# Patient Record
Sex: Male | Born: 1945 | Race: White | Hispanic: No | Marital: Married | Smoking: Never smoker
Health system: Southern US, Community
[De-identification: ages and names within clinical notes are randomized; demographics above are authoritative.]

## PROBLEM LIST (undated history)

## (undated) DIAGNOSIS — G629 Polyneuropathy, unspecified: Secondary | ICD-10-CM

## (undated) DIAGNOSIS — M109 Gout, unspecified: Secondary | ICD-10-CM

## (undated) DIAGNOSIS — J309 Allergic rhinitis, unspecified: Secondary | ICD-10-CM

## (undated) DIAGNOSIS — I10 Essential (primary) hypertension: Secondary | ICD-10-CM

## (undated) DIAGNOSIS — E669 Obesity, unspecified: Secondary | ICD-10-CM

## (undated) DIAGNOSIS — I82409 Acute embolism and thrombosis of unspecified deep veins of unspecified lower extremity: Secondary | ICD-10-CM

## (undated) DIAGNOSIS — K635 Polyp of colon: Secondary | ICD-10-CM

## (undated) DIAGNOSIS — N529 Male erectile dysfunction, unspecified: Secondary | ICD-10-CM

## (undated) DIAGNOSIS — K5790 Diverticulosis of intestine, part unspecified, without perforation or abscess without bleeding: Secondary | ICD-10-CM

## (undated) DIAGNOSIS — R011 Cardiac murmur, unspecified: Secondary | ICD-10-CM

## (undated) DIAGNOSIS — N189 Chronic kidney disease, unspecified: Secondary | ICD-10-CM

## (undated) DIAGNOSIS — G4733 Obstructive sleep apnea (adult) (pediatric): Secondary | ICD-10-CM

## (undated) DIAGNOSIS — K219 Gastro-esophageal reflux disease without esophagitis: Secondary | ICD-10-CM

## (undated) DIAGNOSIS — E7800 Pure hypercholesterolemia, unspecified: Secondary | ICD-10-CM

## (undated) DIAGNOSIS — H9190 Unspecified hearing loss, unspecified ear: Secondary | ICD-10-CM

## (undated) DIAGNOSIS — G43909 Migraine, unspecified, not intractable, without status migrainosus: Secondary | ICD-10-CM

## (undated) DIAGNOSIS — I359 Nonrheumatic aortic valve disorder, unspecified: Secondary | ICD-10-CM

## (undated) DIAGNOSIS — M21371 Foot drop, right foot: Secondary | ICD-10-CM

## (undated) DIAGNOSIS — G7000 Myasthenia gravis without (acute) exacerbation: Secondary | ICD-10-CM

## (undated) DIAGNOSIS — H532 Diplopia: Secondary | ICD-10-CM

## (undated) HISTORY — DX: Myasthenia gravis without (acute) exacerbation: G70.00

## (undated) HISTORY — DX: Male erectile dysfunction, unspecified: N52.9

## (undated) HISTORY — DX: Polyp of colon: K63.5

## (undated) HISTORY — DX: Cardiac murmur, unspecified: R01.1

## (undated) HISTORY — DX: Unspecified hearing loss, unspecified ear: H91.90

## (undated) HISTORY — PX: OTHER SURGICAL HISTORY: SHX169

## (undated) HISTORY — DX: Diverticulosis of intestine, part unspecified, without perforation or abscess without bleeding: K57.90

## (undated) HISTORY — DX: Nonrheumatic aortic valve disorder, unspecified: I35.9

## (undated) HISTORY — DX: Gout, unspecified: M10.9

## (undated) HISTORY — DX: Obesity, unspecified: E66.9

## (undated) HISTORY — DX: Obstructive sleep apnea (adult) (pediatric): G47.33

## (undated) HISTORY — DX: Allergic rhinitis, unspecified: J30.9

## (undated) HISTORY — DX: Diplopia: H53.2

## (undated) HISTORY — DX: Gastro-esophageal reflux disease without esophagitis: K21.9

## (undated) HISTORY — DX: Chronic kidney disease, unspecified: N18.9

## (undated) HISTORY — DX: Essential (primary) hypertension: I10

## (undated) HISTORY — DX: Pure hypercholesterolemia, unspecified: E78.00

## (undated) HISTORY — DX: Migraine, unspecified, not intractable, without status migrainosus: G43.909

---
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
## 1997-09-17 ENCOUNTER — Other Ambulatory Visit: Admission: RE | Admit: 1997-09-17 | Discharge: 1997-09-17 | Payer: Self-pay | Admitting: Family Medicine

## 1998-05-26 ENCOUNTER — Ambulatory Visit (HOSPITAL_COMMUNITY): Admission: RE | Admit: 1998-05-26 | Discharge: 1998-05-26 | Payer: Self-pay | Admitting: Gastroenterology

## 2000-11-21 ENCOUNTER — Ambulatory Visit (HOSPITAL_COMMUNITY): Admission: RE | Admit: 2000-11-21 | Discharge: 2000-11-21 | Payer: Self-pay | Admitting: Gastroenterology

## 2000-11-21 ENCOUNTER — Encounter (INDEPENDENT_AMBULATORY_CARE_PROVIDER_SITE_OTHER): Payer: Self-pay

## 2001-06-27 ENCOUNTER — Ambulatory Visit (HOSPITAL_COMMUNITY): Admission: RE | Admit: 2001-06-27 | Discharge: 2001-06-27 | Payer: Self-pay | Admitting: Gastroenterology

## 2001-06-27 ENCOUNTER — Encounter (INDEPENDENT_AMBULATORY_CARE_PROVIDER_SITE_OTHER): Payer: Self-pay | Admitting: Specialist

## 2002-09-16 ENCOUNTER — Encounter (INDEPENDENT_AMBULATORY_CARE_PROVIDER_SITE_OTHER): Payer: Self-pay | Admitting: Specialist

## 2002-09-16 ENCOUNTER — Ambulatory Visit (HOSPITAL_COMMUNITY): Admission: RE | Admit: 2002-09-16 | Discharge: 2002-09-16 | Payer: Self-pay | Admitting: Gastroenterology

## 2002-12-20 ENCOUNTER — Ambulatory Visit (HOSPITAL_COMMUNITY): Admission: RE | Admit: 2002-12-20 | Discharge: 2002-12-20 | Payer: Self-pay | Admitting: Gastroenterology

## 2002-12-20 ENCOUNTER — Encounter (INDEPENDENT_AMBULATORY_CARE_PROVIDER_SITE_OTHER): Payer: Self-pay | Admitting: Specialist

## 2003-06-12 ENCOUNTER — Ambulatory Visit (HOSPITAL_COMMUNITY): Admission: RE | Admit: 2003-06-12 | Discharge: 2003-06-12 | Payer: Self-pay | Admitting: Gastroenterology

## 2003-06-12 ENCOUNTER — Encounter (INDEPENDENT_AMBULATORY_CARE_PROVIDER_SITE_OTHER): Payer: Self-pay | Admitting: Specialist

## 2005-10-11 ENCOUNTER — Encounter: Payer: Self-pay | Admitting: Internal Medicine

## 2005-12-06 ENCOUNTER — Ambulatory Visit: Payer: Self-pay | Admitting: Internal Medicine

## 2006-04-06 ENCOUNTER — Ambulatory Visit: Payer: Self-pay | Admitting: Internal Medicine

## 2008-04-30 ENCOUNTER — Telehealth (INDEPENDENT_AMBULATORY_CARE_PROVIDER_SITE_OTHER): Payer: Self-pay | Admitting: *Deleted

## 2008-06-04 DIAGNOSIS — G4733 Obstructive sleep apnea (adult) (pediatric): Secondary | ICD-10-CM

## 2008-06-05 ENCOUNTER — Ambulatory Visit: Payer: Self-pay | Admitting: Internal Medicine

## 2008-06-21 DIAGNOSIS — I10 Essential (primary) hypertension: Secondary | ICD-10-CM

## 2008-06-21 DIAGNOSIS — E785 Hyperlipidemia, unspecified: Secondary | ICD-10-CM | POA: Insufficient documentation

## 2008-06-21 DIAGNOSIS — J309 Allergic rhinitis, unspecified: Secondary | ICD-10-CM | POA: Insufficient documentation

## 2010-09-10 NOTE — Procedures (Signed)
[facility]  Patient:    Edward [name], Edward [name] Visit Number: [id number] MRN: [id number]          Service Type: END Location: ENDO Attending Physician:  Louie Bun Dictated by:   Everardo All Madilyn Fireman, M.D. Proc. Date: 06/27/01 Admit Date:  06/27/2001   CC:         Duncan Dull, M.D.   Procedure Report  PROCEDURE:  Esophagogastroduodenoscopy with biopsy.  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  DESCRIPTION OF PROCEDURE:  The patient was placed in the left lateral decubitus position then placed on the pulse monitor with continuous low flow oxygen delivered by nasal cannula. He was sedated with 50 mg IV Demerol and 4 mg IV Versed. The Olympus video endoscope was advanced under direct vision into the oropharynx and esophagus. The esophagus was straight and of normal caliber with the squamocolumnar line somewhat broken up at approximately 35-37 cm. The lower esophageal sphincter appeared to be at about 38 cm defining a Barretts segment of approximately 2-4 cm. There was a 3 cm hiatal hernia distal to the lower esophageal sphincter. No ulcerations, erosions or any visible worrisome mucosal abnormalities were seen within the Barretts segment or hernia sac. Six biopsies were taken from the Barretts segment. The stomach was entered and a small amount of liquid secretions were suctioned from the fundus. Retroflexed view of the cardia confirmed a hiatal hernia but otherwise unremarkable. The fundus, body, antrum and pylorus all appeared normal. The duodenum was entered and both the bulb and second portion were well inspected and appeared to be within normal limits. The scope was then withdrawn and the patient returned to the recovery room in stable condition. The patient tolerated the procedure well and there were no immediate complications.  IMPRESSION:  Relatively short segment of Barretts  esophagus.  PLAN:  Will await biopsy results to determine interval for next endoscopy. Dictated by:   Everardo All Madilyn Fireman, M.D. Attending Physician:  Louie Bun DD:  06/27/01 TD:  06/28/01 Job: [id number]

## 2010-09-10 NOTE — Op Note (Signed)
   NAME:  Edward [name], Edward [name]                            ACCOUNT NO.:  [id number]   MEDICAL RECORD NO.:  [id number]                   PATIENT TYPE:  AMB   LOCATION:  ENDO                                 FACILITY[facility]  [facility]   PHYSICIAN:  Sanav C. Madilyn Fireman, M.D.                 DATE OF BIRTH:  Sep 17, 1945   DATE OF PROCEDURE:  09/16/2002  DATE OF DISCHARGE:                                 OPERATIVE REPORT   PROCEDURE:  Colonoscopy.   INDICATIONS FOR PROCEDURE:  Colon cancer screening in a 65 year old patient  with no prior screening.   DESCRIPTION OF PROCEDURE:  The patient was placed in the left lateral  decubitus position and placed on the pulse monitor, with continuous low-flow  oxygen delivered by nasal cannula.  He was sedated with 12.5 mcg IV fentanyl  and 1 mg IV Versed, in addition to the medication given for the previous  EGD.  The Olympus video colonoscope was inserted into the rectum and  advanced to the cecum, confirmed by transillumination of McBurney's point  and visualization of the ileocecal valve and appendiceal orifice.  The prep  was good.  The cecum, ascending, transverse, descending  colon all appeared  normal -- with no masses, polyps, diverticula or other mucosal  abnormalities.  Within the sigmoid colon there were diffuse, scattered  diverticula and an 8 mm polyp at 32 cm.  This polyp was fulgurated by hot  biopsy.  The rectum appeared normal, and retroflexed view of the anus  revealed no obvious internal hemorrhoids.  The colonoscope as then withdrawn  and the patient returned to the recovery room in stable condition.  He  tolerated the procedure well and there were no immediate complications.   IMPRESSION:  1. Small sigmoid colon polyp.  2. Sigmoid diverticulosis.   PLAN:  Await biopsy results.                                               Arslan C. Madilyn Fireman, M.D.    JCH/MEDQ  D:  09/16/2002  T:  09/16/2002  Job:  [id number]   cc:   Duncan Dull, M.D.  [address]  [place]  [place] [address]  Fax: [phone]

## 2010-09-10 NOTE — Assessment & Plan Note (Signed)
[facility]                               PULMONARY OFFICE NOTE   NAME:Edward [name], Edward [name]                         MRN:          [id number]  DATE:12/06/2005                            DOB:          1946/03/14    PROBLEM:  Sleep medicine consultation at the kind request of Dr. Shaune Pollack  for this 65 year old gentleman with obstructive sleep apnea.   HISTORY:  His wife had been complaining for over one year with increasing  frequency about his loud snoring and even told him that he was noisy if he  sat breathing quietly watching T.V.  He was aware that he was more tired  during the daytime than he should be, needing naps and at least on one  occasion, falling asleep and hitting his head on his computer.  A nocturnal  polysomnogram was done at [facility] on October 11, 2005,  recording an AHI of 54 obstructive events per hour with an oxygen  desaturation to 73%, nonpositional snoring and successful C-PAP titration to  12 CWP for an AHI of 1.7 per hour.  He sees me to establish for long-term  management of his sleep apnea and interaction with his home care company.  He was fitting initially with an auto-titration machine through [employer] [facility].  That pressure titrated to 10.2 CWP as of July 22.  He reports  bedtime around 11 p.m. with a 10-15 minute sleep latency.  Previously he had  been waking at least three times a night for the bathroom.  Since wearing C-  PAP, he only needs to wake once before finally waking at 6:30 a.m.  He does  note that his weight has continued drifting up.   REVIEW OF SYSTEMS:  Loud snoring and witnessed apneas, daytime sleepiness,  dosing off in the early afternoon and waking more tired than he thinks he  should.  Some tenderness towards bronchitis at the beginning of each school  year in a fairly typical pattern.  He is not aware of unusual movement or  behavior in sleep other than as described.  He denies  headaches, confusion  or syncope.   PAST HISTORY:  Hypertension, elevated cholesterol, some mild seasonal pollen  rhinitis, usually controlled by Allegra, obstructive sleep apnea, prior  surgery for septoplasty.  He tried a laser-assisted palatoplasty, which  mainly resected the uvula and made little difference.  There is no history  of thyroid, lung or heart disease and no history of central nervous system  problems.   MEDICATIONS:  C-PAP had auto-titration, Allegra 180 mg, Lipitor, Lotrel,  Prevacid.   ALLERGIES:  No medication allergies.   SOCIAL HISTORY:  Never smoked, at least one-to-two cups of coffee a day as a  habit.  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   FAMILY HISTORY:  Parents with asthma and heart disease, a brother with  cancer.  Three brothers known to snore loudly, with whom at least one has  sleep apnea on C-PAP.   OBJECTIVE:  VITAL SIGNS:  Weight 244 pounds, blood pressure 118/66, pulse  regular 66, room air saturation 95%.  GENERAL:  This is a somewhat overweight, comfortable-appearing, alert  gentleman.  HEENT:  Voice quality is normal.  Nasal airway is clear.  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  No visible postnasal drainage.  There is fairly short  thick neck without stridor, thyromegaly, neck vein  distention.  CHEST:  Quiet, clear lung fields, unlabored breathing.  Heart sounds are  regular without murmur or gallop.  EXTREMITIES:  No restlessness or tremor.  No clubbing, cyanosis or edema.   IMPRESSION:  Obstructive sleep apnea/hypopnea with an AHI of 54 per hour on  sleep study done October 11, 2005 and subsequent C-PAP titration to a target  range between 10 and 12 CWP.  He is looking forward to getting fitted with a  fixed pressure machine.  We talked today about the physiology, medical  concerns and available treatment for obstructive sleep apnea, emphasizing  his responsibility to keep his  weight down and to drive safely.   PLAN:  [facility] is going to change him to a fixed pressure  machine at 11 CWP.  Will schedule return in four months unless needed  earlier and after that, anticipate seeing him once a year to maintain  contact, documentation and equipment as needed.   I appreciated the chance to meet Mr. Mercier and would be very happy to  discuss his care.                                   Clinton D. Maple Hudson, MD, [facility], FACP   CDY/MedQ  DD:  12/06/2005  DT:  12/07/2005  Job #:  [id number]   cc:   Duncan Dull, MD

## 2010-09-10 NOTE — Procedures (Signed)
[facility]  Patient:    Edward [name], Edward [name]                         MRN: [id number] Proc. Date: 11/21/00 Adm. Date:  [id number] Attending:  Louie Bun CC:         Duncan Dull, M.D.   Procedure Report  PROCEDURE PERFORMED:  Esophagogastroduodenoscopy.  ENDOSCOPIST:  Everardo All. Madilyn Fireman, M.D.  INDICATIONS FOR PROCEDURE:  History of Barretts esophagus based on EGD two years ago.  DESCRIPTION OF PROCEDURE:  The patient was placed in the left lateral decubitus position position and placed on the pulse monitor with continuous low-flow oxygen delivered by nasal cannula.  He was sedated with 50 mg IV Demerol and 6 mg IV Versed.  The Olympus video endoscope was advanced under direct vision to the oropharynx and esophagus.  The esophagus was straight and of normal caliber at the squamocolumnar line, eroded into several tongues of columnar epithelium extending from about 30 cm to 33 cm.  The true LES appeared to be at about 35 cm about a 3 cm hiatal hernia.  Six biopsies were taken from the Barretts segment.  There was no visible stricture, ring or visible suspicion of neoplasm.  The stomach was entered and a small amount of liquid secretions was suctioned from the fundus.  A retroflex view was unremarkable except for the hiatal hernia.  The fundus, body, antrum and pylorus all appeared normal.  The duodenum was entered and both the bulb and second portion were well inspected and appeared to be within normal limits. The scope was then withdrawn and the patient returned to the recovery room in stable condition.  He tolerated the procedure well.  There were no immediate complications.  IMPRESSION: 1. Barretts esophagus. 2. Hiatal hernia.  PLAN: Await histology to rule out dysplasia. DD:  11/21/00 TD:  11/21/00 Job: [id number]

## 2010-09-10 NOTE — Op Note (Signed)
   NAME:  Edward [name], Edward [name]                            ACCOUNT NO.:  [id number]   MEDICAL RECORD NO.:  [id number]                   PATIENT TYPE:  AMB   LOCATION:  ENDO                                 FACILITY[facility]  [facility]   PHYSICIAN:  Brendon C. Madilyn Fireman, M.D.                 DATE OF BIRTH:  1945/11/13   DATE OF PROCEDURE:  12/20/2002  DATE OF DISCHARGE:                                 OPERATIVE REPORT   PROCEDURE:  Colonoscopy.   INDICATIONS FOR PROCEDURE:  Barrett's esophagus with mild dysplasia on  biopsies from last EGD six months ago.   DESCRIPTION OF PROCEDURE:  The patient was placed in the left lateral  decubitus position then placed on the pulse monitor with continuous low flow  oxygen delivered by nasal cannula. He was sedated with 25 mcg IV fentanyl  and 4 mg IV Versed. The Olympus video endoscope was advanced under direct  vision into the oropharynx and esophagus. The esophagus was straight and of  normal caliber with the squamocolumnar line broken up at about 36 to 39 cm  with some islands of squamous mucosa below the main Z line. The true LES  appeared to be at about 39 cm and there was a 3 cm hiatal hernia distal to  it. Four biopsies were taken of the relatively short Barrett's segment and  there is no visible suspicion of neoplasm and no visible erosion no ring or  stricture. The stomach was entered and a small amount of liquid secretions  were suctioned from the fundus. Retroflexed view of the cardia confirmed a  hiatal hernia and was otherwise unremarkable. The fundus, body, antrum and  pylorus all appeared normal. The duodenum was entered and both the bulb and  second portion were well inspected and appeared to be within normal limits.  The scope was then withdrawn and the patient returned to the recovery room  in stable condition. He tolerated the procedure well and there were no  immediate complications.   IMPRESSION:  1. Approximately 3 cm segment of Barrett's esophagus.  2. 3 cm hiatal hernia.   PLAN:  Await biopsies to determine interval for future surveillance.                                               Joni C. Madilyn Fireman, M.D.    JCH/MEDQ  D:  12/20/2002  T:  12/20/2002  Job:  [id number]   cc:   Duncan Dull, M.D.  [address]  [place]  [place] [address]  Fax: [phone]

## 2010-09-10 NOTE — Assessment & Plan Note (Signed)
[facility]                             PULMONARY OFFICE NOTE   NAME:Edward [name]                         MRN:          [id number]  DATE:04/06/2006                            DOB:          1945-08-19    PROBLEM:  Obstructive sleep apnea.   HISTORY:  He is using CPAP every night.  He says it is annoying, but I  use it.  It clearly helps him breathe better.  He seems satisfied to  continue with this therapy.  His wife complains of his loud breathing if  he is sitting in the same room with her just watching TV, but not  asleep.  He does not feel tired during the daytime.  CPAP has been set  on 11 CWP through [facility], and we talked about comfort  measures.   MEDICATIONS:  1. CPAP 11 CWP.  2. Allergan 180 mg.  3. Lipitor.  4. Lotrel.  5. Prevacid.   ALLERGIES:  No medication allergy.   OBJECTIVE:  His weight last visit was recorded as 244 pounds, and on the  present visit as 200 pounds.  One of these is unlikely to be correct.  He is still overweight with a long palate.  There is a pink area on his  mid forehead above the level where his CPAP mask would interface, and he  does not think the mask is pressing uncomfortably.   IMPRESSION:  1. Obstructive sleep apnea, may be fairly well controlled, but we need      to maximize comfort from his continuous positive airway pressure      and would want to try a lower pressure step to see if we can get      away with it.  2. His snoring while watching TV may not respond to medications, but      we will see whether or not we can reduce some swelling and make a      change there for him.  I have discussed available measures.  We      will treat it as a rhinitis.  I have emphasized the importance of      weight loss.   PLAN:  1. [facility] is going to reduce his CPAP pressure from 11      to 10 CWP.  2. Try Nasacort AQ 1 or 2 sprays each nostril daily.  3. Schedule return in 4  months, earlier p.r.n.     Edward D. Maple Hudson, MD, Edward [name], FACP  Electronically Signed    CDY/MedQ  DD: 04/08/2006  DT: 04/08/2006  Job #: [id number]   cc:   Edward [name], M.D.

## 2010-09-10 NOTE — Op Note (Signed)
NAME:  Edward [name], Edward [name]                            ACCOUNT NO.:  [id number]   MEDICAL RECORD NO.:  [id number]                   PATIENT TYPE:  AMB   LOCATION:  ENDO                                 FACILITY:  [facility]   PHYSICIAN:  Boen C. Madilyn Fireman, M.D.                 DATE OF BIRTH:  10-19-1945   DATE OF PROCEDURE:  06/12/2003  DATE OF DISCHARGE:                                 OPERATIVE REPORT   PROCEDURE:  Esophagogastroduodenoscopy.   INDICATIONS FOR PROCEDURE:  Barrett's esophagus with low grade dysplasia on  last EGD one year ago.   DESCRIPTION OF PROCEDURE:  The patient was placed in the left lateral  decubitus position then placed on the pulse monitor with continuous low flow  oxygen delivered by nasal cannula. He was sedated with 50 mcg IV fentanyl  and 4 mg IV Versed. The Olympus video endoscope was advanced under direct  vision into the oropharynx and esophagus. The esophagus was straight and of  normal caliber with the squamocolumnar line at 34 cm above an approximately  3 cm segment of Barrett's esophagus.  There was an approximately 2 cm hiatal  hernia distal to the lower esophageal sphincter.  There was no visible  suspicion of malignancy and no discreet erosions or esophageal ulcers.  Biopsies were taken of the Barrett's segment.  The stomach was entered and a  small amount of liquid secretions were suctioned from the fundus.  Retroflexed view of the cardia confirmed a hiatal hernia and was otherwise  unremarkable. The fundus, body, antrum and pylorus all appeared normal. The  duodenum was entered and both the bulb and second portion were well  inspected and appeared to be within normal limits. The scope was then  withdrawn and the patient returned to the recovery room in stable condition.  He tolerated the procedure well and there were no immediate complications.   IMPRESSION:  Barrett's esophagus with hiatal hernia.   PLAN:  Await biopsy results to assess for  dysplasia.                                               Tacuma C. Madilyn Fireman, M.D.    JCH/MEDQ  D:  06/12/2003  T:  06/12/2003  Job:  [id number]   cc:   Duncan Dull, M.D.  [address]  [place]  [place] [address]  Fax: [phone]

## 2010-09-10 NOTE — Op Note (Signed)
   NAME:  MARON, STANZIONE                            ACCOUNT NO.:  [id number]   MEDICAL RECORD NO.:  [id number]                   PATIENT TYPE:  AMB   LOCATION:  ENDO                                 FACILITY:  [facility]   PHYSICIAN:  Dyshaun C. Madilyn Fireman, M.D.                 DATE OF BIRTH:  1946-01-05   DATE OF PROCEDURE:  09/16/2002  DATE OF DISCHARGE:                                 OPERATIVE REPORT   PREOPERATIVE DIAGNOSIS:  Esophagogastroduodenoscopy.   INDICATION FOR PROCEDURE:  Barrett's esophagus with last EGD approximately  one year ago.   DESCRIPTION OF PROCEDURE:  The patient was placed in the left lateral  decubitus position and placed on the pulse monitor with continuous low-flow  oxygen delivered by nasal cannula.  He was sedated with 62.5 mcg IV fentanyl  and 6 mg IV Versed.  The Olympus video endoscope was advanced under direct  vision into the oropharynx and esophagus.  The esophagus was straight and of  normal caliber with the squamocolumnar line very irregular with islands of  squamous mucosa below the main Z-line and islands of columnar epithelium  above it.  The main level of the Z-line was approximately 33 cm with the LES  thought to be at about 36 m above a 4 cm hiatal hernia, indicating a roughly  3 cm segment of Barrett's esophagus.  There was no stricture, ring, or  visible evidence of neoplasm and no esophageal ulcer.  The stomach was  entered and a small amount of liquid secretions were suctioned from the  fundus.  A retroflexed view of the cardia confirmed the hiatal hernia and  was otherwise unremarkable.  The fundus, body, antrum, and pylorus all  appeared normal.  The duodenum was entered and both the bulb and second  portion were well-inspected and appeared to be within normal limits.  The  scope was withdrawn back into the esophagus and biopsies of the Barrett's  segment were taken.  The scope was then withdrawn and the patient returned  to the recovery room in  stable condition.  He tolerated the procedure well,  and there were no immediate complications.   IMPRESSION:  1. Approximately 3 cm segment of Barrett's esophagus.  2. Hiatal hernia.   PLAN:  Await biopsy results.                                               Korie C. Madilyn Fireman, M.D.    JCH/MEDQ  D:  09/16/2002  T:  09/16/2002  Job:  [id number]   cc:   Duncan Dull, M.D.  [address]  [place]  [place] [address]  Fax: [phone]

## 2012-01-06 ENCOUNTER — Other Ambulatory Visit: Payer: Self-pay | Admitting: Gastroenterology

## 2013-02-07 ENCOUNTER — Encounter: Payer: Self-pay | Admitting: *Deleted

## 2013-02-07 ENCOUNTER — Encounter: Payer: Self-pay | Admitting: Cardiology

## 2013-02-07 DIAGNOSIS — M109 Gout, unspecified: Secondary | ICD-10-CM | POA: Insufficient documentation

## 2013-02-07 DIAGNOSIS — I10 Essential (primary) hypertension: Secondary | ICD-10-CM | POA: Insufficient documentation

## 2013-02-07 DIAGNOSIS — E669 Obesity, unspecified: Secondary | ICD-10-CM | POA: Insufficient documentation

## 2013-02-10 ENCOUNTER — Encounter: Payer: Self-pay | Admitting: Cardiology

## 2013-02-10 DIAGNOSIS — N200 Calculus of kidney: Secondary | ICD-10-CM | POA: Insufficient documentation

## 2013-02-10 DIAGNOSIS — K5790 Diverticulosis of intestine, part unspecified, without perforation or abscess without bleeding: Secondary | ICD-10-CM | POA: Insufficient documentation

## 2013-02-10 DIAGNOSIS — K219 Gastro-esophageal reflux disease without esophagitis: Secondary | ICD-10-CM | POA: Insufficient documentation

## 2013-02-10 DIAGNOSIS — K635 Polyp of colon: Secondary | ICD-10-CM | POA: Insufficient documentation

## 2013-02-10 DIAGNOSIS — G43909 Migraine, unspecified, not intractable, without status migrainosus: Secondary | ICD-10-CM | POA: Insufficient documentation

## 2013-02-11 ENCOUNTER — Ambulatory Visit (INDEPENDENT_AMBULATORY_CARE_PROVIDER_SITE_OTHER): Payer: Medicare Other | Admitting: Cardiology

## 2013-02-11 ENCOUNTER — Encounter: Payer: Self-pay | Admitting: Cardiology

## 2013-02-11 VITALS — BP 120/70 | HR 73 | Ht 69.0 in | Wt 235.0 lb

## 2013-02-11 DIAGNOSIS — I10 Essential (primary) hypertension: Secondary | ICD-10-CM

## 2013-02-11 DIAGNOSIS — E669 Obesity, unspecified: Secondary | ICD-10-CM

## 2013-02-11 DIAGNOSIS — G4733 Obstructive sleep apnea (adult) (pediatric): Secondary | ICD-10-CM

## 2013-02-11 NOTE — Patient Instructions (Signed)
Your physician wants you to follow-up in: 6 months with Dr. Turner. You will receive a reminder letter in the mail two months in advance. If you don't receive a letter, please call our office to schedule the follow-up appointment.  

## 2013-02-11 NOTE — Progress Notes (Signed)
[address] [place], [place]  [address] Phone: [phone] Fax:  [phone]  Date:  02/11/2013   ID:  Edward [name], DOB 19-Apr-1946, MRN [id number]  PCP:  No primary provider on file.  Cardiologist:  Armanda Magic, MD   History of Present Illness: Edward [name] is a 67 y.o. male with a history of OSA/HTN/OSA.  He is doing well.  He tolerates his CPAP well.  He tolerates the mask and pressure well.  He has no daytime sleepiness and feels refreshed when he gets up in the am.  His download today showed an AHI of 1.7/hr and 100% usage more than 4 hours nightly on 10cm H2O.  He gets anywhere from 6-8 hours of sleep nightly.  On the nights he does not get enough sleep he feels sleepy the next day.   Wt Readings from Last 3 Encounters:  06/05/08 242 lb (109.77 kg)     Past Medical History  Diagnosis Date  . Erectile dysfunction   . Heart murmur   . Sleep apnea     AHI 43/hr now on CPAP at 10cm H2O  . Hypercholesteremia   . HTN (hypertension)   . Hyperlipidemia   . Gout   . OSA (obstructive sleep apnea)   . Obesity   . Hearing loss     hearing aides  . Migraine headache   . Allergic rhinitis   . Diverticulosis   . GERD (gastroesophageal reflux disease)     with Barrett's esophagus  . Colon polyps   . Chronic kidney disease     kidney stones    Current Outpatient Prescriptions  Medication Sig Dispense Refill  . cetirizine (ZYRTEC) 10 MG tablet Take 10 mg by mouth daily.      . clobetasol cream (TEMOVATE) 0.05 % Apply topically as needed.      . fish oil-omega-3 fatty acids 1000 MG capsule Take 2 g by mouth 2 (two) times daily.      . indomethacin (INDOCIN) 50 MG capsule Take 50 mg by mouth as needed.      . vitamin C (ASCORBIC ACID) 500 MG tablet Take 500 mg by mouth daily.      Marland Kitchen allopurinol (ZYLOPRIM) 300 MG tablet Take 300 mg by mouth daily.      Marland Kitchen amLODipine-benazepril (LOTREL) 5-10 MG per capsule Take 1 capsule by mouth daily.      Marland Kitchen aspirin 81 MG tablet  Take 81 mg by mouth daily.      Marland Kitchen atorvastatin (LIPITOR) 10 MG tablet Take 10 mg by mouth daily.      . Biotin 1000 MCG tablet Take 1,000 mcg by mouth 3 (three) times daily.      . cholecalciferol (VITAMIN D) 1000 UNITS tablet Take 1,000 Units by mouth daily.      . Garlic 2000 MG CAPS Take by mouth.      . Multiple Vitamin (MULTIVITAMIN) capsule Take 1 capsule by mouth daily.      Marland Kitchen omeprazole (PRILOSEC) 20 MG capsule Take 20 mg by mouth daily.       No current facility-administered medications for this visit.    Allergies:   Allergies not on file  Social History:  The patient  reports that he has never smoked. He does not have any smokeless tobacco history on file. He reports that he drinks alcohol.   Family History:  The patient's family history includes Heart attack in his father; Heart disease in his brother and  father; Hypertension in his brother, brother, mother, and sister.   ROS:  Please see the history of present illness.      All other systems reviewed and negative.   PHYSICAL EXAM: VS:  There were no vitals taken for this visit. Well nourished, well developed, in no acute distress HEENT: normal Neck: no JVD Cardiac:  normal S1, S2; RRR; no murmur Lungs:  clear to auscultation bilaterally, no wheezing, rhonchi or rales Abd: soft, nontender, no hepatomegaly Ext: no edema Skin: warm and dry Neuro:  CNs 2-12 intact, no focal abnormalities noted  ASSESSMENT AND PLAN:  1. OSA on CPAP  - Continue CPAP at 10cm H2O 2. Obesity  - encouraged him to continue his exercise 3. HTN  - continue amlodipine  Followup with me in 6 months  Signed, Armanda Magic, MD 02/11/2013 10:04 AM

## 2013-02-15 ENCOUNTER — Ambulatory Visit: Payer: Self-pay | Admitting: Podiatrist

## 2013-03-29 ENCOUNTER — Other Ambulatory Visit: Payer: Self-pay | Admitting: Dermatology

## 2013-09-01 ENCOUNTER — Encounter: Payer: Self-pay | Admitting: Cardiology

## 2013-09-02 ENCOUNTER — Ambulatory Visit (INDEPENDENT_AMBULATORY_CARE_PROVIDER_SITE_OTHER): Payer: Medicare Other | Admitting: Cardiology

## 2013-09-02 ENCOUNTER — Encounter: Payer: Self-pay | Admitting: Cardiology

## 2013-09-02 VITALS — BP 122/80 | HR 80 | Ht 69.0 in | Wt 230.0 lb

## 2013-09-02 DIAGNOSIS — G4733 Obstructive sleep apnea (adult) (pediatric): Secondary | ICD-10-CM

## 2013-09-02 DIAGNOSIS — E669 Obesity, unspecified: Secondary | ICD-10-CM

## 2013-09-02 DIAGNOSIS — I10 Essential (primary) hypertension: Secondary | ICD-10-CM

## 2013-09-02 NOTE — Progress Notes (Signed)
[address] [place], [place]  [address] Phone: [phone] Fax:  [phone]  Date:  09/02/2013   ID:  Edward [name], DOB 08/19/1945, MRN [id number]  PCP:  Hollice EspyGATES,DONNA RUTH, MD  Cardiologist:  Armanda Magicraci Azyah Flett, MD     History of Present Illness: Edward [name] is a 68 y.o. male with a history of OSA/HTN/OSA. He is doing well. He tolerates his CPAP well. He tolerates the nasal pillow mask and pressure well. He has minimal daytime sleepiness and feels refreshed when he gets up in the am. His download today showed an AHI of 2.7/hr and 100% usage more than 4 hours nightly on 10cm H2O. He gets anywhere from 6-8 hours of sleep nightly. On the nights he does not get enough sleep he feels sleepy the next day.    Wt Readings from Last 3 Encounters:  09/02/13 230 lb (104.327 kg)  02/11/13 235 lb (106.595 kg)  06/05/08 242 lb (109.77 kg)     Past Medical History  Diagnosis Date  . Erectile dysfunction   . Heart murmur   . Sleep apnea     AHI 43/hr now on CPAP at 10cm H2O  . Hypercholesteremia   . HTN (hypertension)   . Hyperlipidemia   . Gout   . OSA (obstructive sleep apnea)   . Obesity   . Hearing loss     hearing aides  . Migraine headache   . Allergic rhinitis   . Diverticulosis   . GERD (gastroesophageal reflux disease)     with Barrett's esophagus  . Colon polyps   . Chronic kidney disease     kidney stones    Current Outpatient Prescriptions  Medication Sig Dispense Refill  . allopurinol (ZYLOPRIM) 300 MG tablet Take 300 mg by mouth daily.      Marland Kitchen. amLODipine-benazepril (LOTREL) 5-10 MG per capsule Take 1 capsule by mouth daily.      Marland Kitchen. aspirin 81 MG tablet Take 81 mg by mouth daily.      Marland Kitchen. atorvastatin (LIPITOR) 10 MG tablet Take 10 mg by mouth daily.      . Biotin 1000 MCG tablet Take 1,000 mcg by mouth 3 (three) times daily.      . cetirizine (ZYRTEC) 10 MG tablet Take 10 mg by mouth daily.      . cholecalciferol (VITAMIN D) 1000 UNITS tablet Take 1,000 Units  by mouth daily.      . clobetasol cream (TEMOVATE) 0.05 % Apply topically as needed.      . fish oil-omega-3 fatty acids 1000 MG capsule Take 2 g by mouth 2 (two) times daily.      . Garlic 2000 MG CAPS Take by mouth.      . indomethacin (INDOCIN) 50 MG capsule Take 50 mg by mouth as needed.      . Multiple Vitamin (MULTIVITAMIN) capsule Take 1 capsule by mouth daily.      Marland Kitchen. omeprazole (PRILOSEC) 20 MG capsule Take 20 mg by mouth daily.      Marland Kitchen. terbinafine (LAMISIL) 250 MG tablet Take 1 tablet by mouth daily.       No current facility-administered medications for this visit.    Allergies:   No Known Allergies  Social History:  The patient  reports that he has never smoked. He does not have any smokeless tobacco history on file. He reports that he drinks alcohol.   Family History:  The patient's family history includes Heart attack in his father; Heart  disease in his brother and father; Hypertension in his brother, brother, mother, and sister.   ROS:  Please see the history of present illness.      All other systems reviewed and negative.   PHYSICAL EXAM: VS:  BP 122/80  Pulse 80  Ht 5\' 9"  (1.753 m)  Wt 230 lb (104.327 kg)  BMI 33.95 kg/m2 Well nourished, well developed, in no acute distress HEENT: normal Neck: no JVD Cardiac:  normal S1, S2; RRR; no murmur Lungs:  clear to auscultation bilaterally, no wheezing, rhonchi or rales Abd: soft, nontender, no hepatomegaly Ext: trace edema Skin: warm and dry Neuro:  CNs 2-12 intact, no focal abnormalities noted   ASSESSMENT AND PLAN:  1. OSA on CPAP - well controlled and tolerating well at current CPAP setting - Continue CPAP at 10cm H2O  2. Obesity - encouraged him to continue his exercise but he is limited at present due to plantar fasciitis 3. HTN - well controlled - continue amlodipine   Followup with me in 6 months      Signed, Armanda Magicraci Kashayla Ungerer, MD 09/02/2013 9:47 AM

## 2013-09-02 NOTE — Patient Instructions (Signed)
Your physician recommends that you continue on your current medications as directed. Please refer to the Current Medication list given to you today.  Your physician wants you to follow-up in: 6 months with Dr Turner You will receive a reminder letter in the mail two months in advance. If you don't receive a letter, please call our office to schedule the follow-up appointment.  

## 2013-11-27 ENCOUNTER — Encounter: Payer: Self-pay | Admitting: Podiatry

## 2013-11-27 ENCOUNTER — Ambulatory Visit (INDEPENDENT_AMBULATORY_CARE_PROVIDER_SITE_OTHER): Payer: 59 | Admitting: Podiatry

## 2013-11-27 ENCOUNTER — Ambulatory Visit (INDEPENDENT_AMBULATORY_CARE_PROVIDER_SITE_OTHER): Payer: 59

## 2013-11-27 VITALS — BP 183/110 | HR 78 | Resp 17

## 2013-11-27 DIAGNOSIS — R52 Pain, unspecified: Secondary | ICD-10-CM

## 2013-11-27 DIAGNOSIS — M722 Plantar fascial fibromatosis: Secondary | ICD-10-CM

## 2013-11-27 MED ORDER — TRIAMCINOLONE ACETONIDE 10 MG/ML IJ SUSP
10.0000 mg | Freq: Once | INTRAMUSCULAR | Status: AC
  Administered 2013-11-27: 10 mg

## 2013-11-27 MED ORDER — DICLOFENAC SODIUM 75 MG PO TBEC: 75.0000 mg | DELAYED_RELEASE_TABLET | Freq: Two times a day (BID) | ORAL | Status: DC

## 2013-11-27 NOTE — Progress Notes (Signed)
Subjective:     Patient ID: Ellouise NewerJohn A Akhavan, male   DOB: 12/09/1945, 68 y.o.   MRN: [id number]  HPI patient presents stating both my heels are killing me. States it's been going on for about 18 months and that he did go to another Dr. who gave him an injection which was not successful and he has a brace orthotics and a boot   Review of Systems  All other systems reviewed and are negative.      Objective:   Physical Exam  Nursing note and vitals reviewed. Constitutional: He is oriented to person, place, and time.  Cardiovascular: Intact distal pulses.   Musculoskeletal: Normal range of motion.  Neurological: He is oriented to person, place, and time.  Skin: Skin is warm.   neurovascular status found to be intact with range of motion of the subtalar and midtarsal joint within normal and patient found to have normal muscle strength. Patient has exquisite inflammation with pain plantar fashion at the insertion of the tendon into the calcaneus with fluid buildup noted bilateral. Digits are well perfused and there is depression of the arch upon weightbearing     Assessment:     Acute plan her fasciitis of both feet which is been present for a fairly long 18 month.    Plan:     H&P and x-rays reviewed. Injected the plantar fascia bilateral 3 mg Kenalog 5 of vesica Marcaine mixture advised on night splint usage and discussed long-term change in orthotic therapy which we will discussed at next visit. Reappoint in 1 week

## 2013-11-27 NOTE — Patient Instructions (Signed)

## 2013-11-27 NOTE — Progress Notes (Signed)
   Subjective:    Patient ID: Ellouise NewerJohn A Twiggs, male    DOB: 12/12/1945, 68 y.o.   MRN: [id number]  HPI  Pt presents with h/o bilateral plantar fascial pain, ongoing since march 2014, has seen previous podiatrist who gave injections in bilateral heels and it did not help relieve the pain, states that orthotics arent helping with pain either  Review of Systems  All other systems reviewed and are negative.      Objective:   Physical Exam        Assessment & Plan:

## 2013-12-04 ENCOUNTER — Ambulatory Visit: Payer: 59 | Admitting: Podiatry

## 2013-12-05 ENCOUNTER — Encounter: Payer: Self-pay | Admitting: Podiatry

## 2013-12-05 ENCOUNTER — Ambulatory Visit (INDEPENDENT_AMBULATORY_CARE_PROVIDER_SITE_OTHER): Payer: 59 | Admitting: Podiatry

## 2013-12-05 VITALS — BP 132/74 | HR 78 | Resp 16

## 2013-12-05 DIAGNOSIS — M722 Plantar fascial fibromatosis: Secondary | ICD-10-CM

## 2013-12-05 MED ORDER — TRIAMCINOLONE ACETONIDE 10 MG/ML IJ SUSP
10.0000 mg | Freq: Once | INTRAMUSCULAR | Status: AC
  Administered 2013-12-05: 10 mg

## 2013-12-05 NOTE — Patient Instructions (Signed)

## 2013-12-06 NOTE — Progress Notes (Signed)
Subjective:     Patient ID: Edward [name], male   DOB: 11/11/1945, 68 y.o.   MRN: [id number]  HPI patient states that my heels have improved but they still bother me when I get up in the morning and this has been going on for an extensive period of time   Review of Systems     Objective:   Physical Exam Neurovascular status intact with muscle strength adequate and noted to have discomfort plantar heel region which is improved but still present bilateral heels    Assessment:     Plantar fasciitis of the heels which has improved but is still present    Plan:     Advised on physical therapy and the fact that this has been a long-term condition so there is no long-term guarantees. I did dispense a night splint for each heel and gave instructions on usage and also ice therapy and patient will be seen back in 4 weeks to reevaluate and decide what else may be necessary for this patient

## 2014-01-02 ENCOUNTER — Ambulatory Visit (INDEPENDENT_AMBULATORY_CARE_PROVIDER_SITE_OTHER): Payer: 59 | Admitting: Podiatry

## 2014-01-02 ENCOUNTER — Ambulatory Visit: Payer: 59 | Admitting: Podiatry

## 2014-01-02 ENCOUNTER — Encounter: Payer: Self-pay | Admitting: Podiatry

## 2014-01-02 VITALS — BP 134/76 | HR 69 | Resp 16

## 2014-01-02 DIAGNOSIS — M722 Plantar fascial fibromatosis: Secondary | ICD-10-CM

## 2014-01-02 NOTE — Progress Notes (Signed)
Subjective:     Patient ID: Edward [name], male   DOB: [birth year], 68 y.o.   MRN: [id number]  HPI patient states I'm doing really well with my heels with minimal discomfort and the splints seem to be helping me quite a bit   Review of Systems     Objective:   Physical Exam Neurovascular status intact with muscle strength adequate and range of motion of the subtalar and midtarsal joint within normal limits. 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

## 2014-03-03 ENCOUNTER — Encounter: Payer: Self-pay | Admitting: Cardiology

## 2014-03-03 ENCOUNTER — Ambulatory Visit (INDEPENDENT_AMBULATORY_CARE_PROVIDER_SITE_OTHER): Payer: Medicare Other | Admitting: Cardiology

## 2014-03-03 VITALS — BP 114/70 | HR 78 | Ht 69.0 in | Wt 238.0 lb

## 2014-03-03 DIAGNOSIS — E669 Obesity, unspecified: Secondary | ICD-10-CM

## 2014-03-03 DIAGNOSIS — G4733 Obstructive sleep apnea (adult) (pediatric): Secondary | ICD-10-CM

## 2014-03-03 DIAGNOSIS — I10 Essential (primary) hypertension: Secondary | ICD-10-CM

## 2014-03-03 NOTE — Progress Notes (Signed)
[address] [place], [place]  [address] Phone: [phone](336) [phone] Fax:  [phone]  Date:  03/03/2014   ID:  Edward NewerJohn A Knarr, DOB 12/01/1945, MRN [id number]  PCP:  Hollice EspyGATES,DONNA RUTH, MD  Cardiologist:  Armanda Magicraci Taylie Helder, MD    History of Present Illness: Edward [name] is a 68 y.o. male with a history of OSA/HTN/OSA. He is doing well. He tolerates his CPAP well. He tolerates the nasal pillow mask and pressure well. He has minimal daytime sleepiness and feels refreshed when he gets up in the am. He does occasionally take an afternoon nap.  His download today showed an AHI of 2.5/hr and 99% usage more than 4 hours nightly on 10cm H2O. He gets anywhere from 6-8 hours of sleep nightly. On the nights he does not get enough sleep he feels sleepy the next day   Wt Readings from Last 3 Encounters:  03/03/14 238 lb (107.956 kg)  09/02/13 230 lb (104.327 kg)  02/11/13 235 lb (106.595 kg)     Past Medical History  Diagnosis Date  . Erectile dysfunction   . Heart murmur   . Sleep apnea     AHI 43/hr now on CPAP at 10cm H2O  . Hypercholesteremia   . HTN (hypertension)   . Hyperlipidemia   . Gout   . OSA (obstructive sleep apnea)   . Obesity   . Hearing loss     hearing aides  . Migraine headache   . Allergic rhinitis   . Diverticulosis   . GERD (gastroesophageal reflux disease)     with Barrett's esophagus  . Colon polyps   . Chronic kidney disease     kidney stones    Current Outpatient Prescriptions  Medication Sig Dispense Refill  . allopurinol (ZYLOPRIM) 300 MG tablet Take 300 mg by mouth daily.    Marland Kitchen. amLODipine-benazepril (LOTREL) 5-10 MG per capsule Take 1 capsule by mouth daily.    Marland Kitchen. aspirin 81 MG tablet Take 81 mg by mouth daily.    Marland Kitchen. atorvastatin (LIPITOR) 10 MG tablet Take 10 mg by mouth daily.    . Biotin 1000 MCG tablet Take 1,000 mcg by mouth 3 (three) times daily.    . cetirizine (ZYRTEC) 10 MG tablet Take 10 mg by mouth daily.    . cholecalciferol (VITAMIN D) 1000  UNITS tablet Take 1,000 Units by mouth daily.    . diclofenac (VOLTAREN) 75 MG EC tablet Take 1 tablet (75 mg total) by mouth 2 (two) times daily. 50 tablet 2  . fish oil-omega-3 fatty acids 1000 MG capsule Take 1 g by mouth 2 (two) times daily.     . indomethacin (INDOCIN) 50 MG capsule Take 50 mg by mouth daily as needed (pain).     . Multiple Vitamin (MULTIVITAMIN) capsule Take 1 capsule by mouth daily.    Marland Kitchen. omeprazole (PRILOSEC) 20 MG capsule Take 20 mg by mouth daily.    Marland Kitchen. terbinafine (LAMISIL) 250 MG tablet Take 1 tablet by mouth daily.     No current facility-administered medications for this visit.    Allergies:   No Known Allergies  Social History:  The patient  reports that he has never smoked. He does not have any smokeless tobacco history on file. He reports that he drinks alcohol.   Family History:  The patient's family history includes Heart attack in his father; Heart disease in his brother and father; Hypertension in his brother, brother, mother, and sister.   ROS:  Please  see the history of present illness.      All other systems reviewed and negative.   PHYSICAL EXAM: VS:  BP 114/70 mmHg  Pulse 78  Ht 5\' 9"  (1.753 m)  Wt 238 lb (107.956 kg)  BMI 35.13 kg/m2 Well nourished, well developed, in no acute distress HEENT: normal Neck: no JVD Cardiac:  normal S1, S2; RRR; no murmur Lungs:  clear to auscultation bilaterally, no wheezing, rhonchi or rales Abd: soft, nontender, no hepatomegaly Ext: no edema Skin: warm and dry Neuro:  CNs 2-12 intact, no focal abnormalities noted  ASSESSMENT AND PLAN:  1. OSA on CPAP - well controlled and tolerating well at current CPAP setting - Continue CPAP at 10cm H2O  2. Obesity - has gained some weight - encouraged him to continue his exercise but he is limited due to plantar fasciitis.  I have recommended that he consider using a stationary bike or swim.   3. HTN - well controlled - continue amlodipine   Followup with me in  6 months        Signed, Armanda Magicraci Cortavius Montesinos, MD [facility] 03/03/2014 9:40 AM

## 2014-03-03 NOTE — Patient Instructions (Signed)
Your physician recommends that you continue on your current medications as directed. Please refer to the Current Medication list given to you today.  Your physician wants you to follow-up in: 6 months with Dr Turner You will receive a reminder letter in the mail two months in advance. If you don't receive a letter, please call our office to schedule the follow-up appointment.  

## 2014-03-05 ENCOUNTER — Encounter: Payer: Self-pay | Admitting: Cardiology

## 2014-06-09 ENCOUNTER — Encounter: Payer: Self-pay | Admitting: Cardiology

## 2014-09-06 NOTE — Progress Notes (Signed)
Cardiology Office Note   Date:  09/08/2014   ID:  Edward NewerJohn A Huntley, DOB 07/08/1945, MRN [id number]  PCP:  Hollice EspyGATES,DONNA RUTH, MD    Chief Complaint  Patient presents with  . Follow-up    sleep apnea  . Obesity  . Follow-up    hypertension      History of Present Illness: Edward [name] is a 69 y.o. male with a history of OSA/HTN/OSA. He is doing well. He tolerates his CPAP well. He tolerates the nasal pillow mask and pressure well. He has minimal daytime sleepiness and feels refreshed when he gets up in the am. He does occasionally take an afternoon nap. His download today showed an AHI of 2.5/hr and 100% usage more than 4 hours nightly on 10cm H2O. He gets anywhere from 6-8 hours of sleep nightly. He works in the yard for exercise.  He has tried getting more aerobic exercise which is limited by his plantar faciitis.  He occasionally has some dry mouth.  He does not have any nasal congestion with the device except in the spring with allergies   Past Medical History  Diagnosis Date  . Erectile dysfunction   . Heart murmur   . Sleep apnea     AHI 43/hr now on CPAP at 10cm H2O  . Hypercholesteremia   . HTN (hypertension)   . Hyperlipidemia   . Gout   . OSA (obstructive sleep apnea)   . Obesity   . Hearing loss     hearing aides  . Migraine headache   . Allergic rhinitis   . Diverticulosis   . GERD (gastroesophageal reflux disease)     with Barrett's esophagus  . Colon polyps   . Chronic kidney disease     kidney stones    Past Surgical History  Procedure Laterality Date  . Laser assisted uvuloplasty       Current Outpatient Prescriptions  Medication Sig Dispense Refill  . allopurinol (ZYLOPRIM) 300 MG tablet Take 300 mg by mouth daily.    Marland Kitchen. amLODipine-benazepril (LOTREL) 5-10 MG per capsule Take 1 capsule by mouth daily.    Marland Kitchen. aspirin 81 MG tablet Take 81 mg by mouth daily.    Marland Kitchen. atorvastatin (LIPITOR) 10 MG tablet Take 10 mg by mouth daily.    . Biotin 1000 MCG  tablet Take 1,000 mcg by mouth daily.     . cetirizine (ZYRTEC) 10 MG tablet Take 10 mg by mouth daily.    . cholecalciferol (VITAMIN D) 1000 UNITS tablet Take 1,000 Units by mouth daily.    . diclofenac (VOLTAREN) 75 MG EC tablet Take 1 tablet (75 mg total) by mouth 2 (two) times daily. 50 tablet 2  . fish oil-omega-3 fatty acids 1000 MG capsule Take 1 g by mouth 2 (two) times daily.     . indomethacin (INDOCIN) 50 MG capsule Take 50 mg by mouth daily as needed (pain).     . Multiple Vitamin (MULTIVITAMIN) capsule Take 1 capsule by mouth daily.    Marland Kitchen. omeprazole (PRILOSEC) 20 MG capsule Take 20 mg by mouth daily.     No current facility-administered medications for this visit.    Allergies:   Review of patient's allergies indicates no known allergies.    Social History:  The patient  reports that he has never smoked. He does not have any smokeless tobacco history on file. He reports that he drinks alcohol.   Family History:  The patient's family history includes Heart attack in  his father; Heart disease in his brother and father; Hypertension in his brother, brother, mother, and sister.    ROS:  Please see the history of present illness.   Otherwise, review of systems are positive for none.   All other systems are reviewed and negative.    PHYSICAL EXAM: VS:  BP 100/70 mmHg  Pulse 73  Ht 5\' 9"  (1.753 m)  Wt 237 lb 6.4 oz (107.684 kg)  BMI 35.04 kg/m2  SpO2 94% , BMI Body mass index is 35.04 kg/(m^2). GEN: Well nourished, well developed, in no acute distress HEENT: normal Neck: no JVD, carotid bruits, or masses Cardiac: RRR; no murmurs, rubs, or gallops,no edema  Respiratory:  clear to auscultation bilaterally, normal work of breathing GI: soft, nontender, nondistended, + BS MS: no deformity or atrophy Skin: warm and dry, no rash Neuro:  Strength and sensation are intact Psych: euthymic mood, full affect   EKG:  EKG is not ordered today.    Recent Labs: No results found  for requested labs within last 365 days.    Lipid Panel No results found for: CHOL, TRIG, HDL, CHOLHDL, VLDL, LDLCALC, LDLDIRECT    Wt Readings from Last 3 Encounters:  09/08/14 237 lb 6.4 oz (107.684 kg)  03/03/14 238 lb (107.956 kg)  09/02/13 230 lb (104.327 kg)    ASSESSMENT AND PLAN:  1. OSA on CPAP - well controlled and tolerating well at current CPAP setting - Continue CPAP at 10cm H2O  2. Obesity  3. - encouraged him to continue his exercise but he is limited due to plantar fasciitis.   4. HTN - well controlled - continue amlodipine     Current medicines are reviewed at length with the patient today.  The patient does not have concerns regarding medicines.  The following changes have been made:  no change  Labs/ tests ordered today include: see above assessment and plan No orders of the defined types were placed in this encounter.     Disposition:   FU with me in 1 year   Signed, Quintella ReichertURNER,TRACI R, MD  09/08/2014 9:37 AM    [facility] [address], [place], [place]  [address] Phone: [phone](336) [phone]; Fax: [phone]

## 2014-09-08 ENCOUNTER — Encounter: Payer: Self-pay | Admitting: Cardiology

## 2014-09-08 ENCOUNTER — Ambulatory Visit (INDEPENDENT_AMBULATORY_CARE_PROVIDER_SITE_OTHER): Payer: Medicare Other | Admitting: Cardiology

## 2014-09-08 VITALS — BP 100/70 | HR 73 | Ht 69.0 in | Wt 237.4 lb

## 2014-09-08 DIAGNOSIS — E669 Obesity, unspecified: Secondary | ICD-10-CM | POA: Diagnosis not present

## 2014-09-08 DIAGNOSIS — I10 Essential (primary) hypertension: Secondary | ICD-10-CM

## 2014-09-08 DIAGNOSIS — G4733 Obstructive sleep apnea (adult) (pediatric): Secondary | ICD-10-CM

## 2014-09-08 NOTE — Patient Instructions (Signed)

## 2014-09-30 ENCOUNTER — Encounter: Payer: Self-pay | Admitting: Cardiology

## 2015-08-26 ENCOUNTER — Encounter: Payer: Self-pay | Admitting: Cardiology

## 2015-08-26 ENCOUNTER — Ambulatory Visit (INDEPENDENT_AMBULATORY_CARE_PROVIDER_SITE_OTHER): Payer: Medicare Other | Admitting: Cardiology

## 2015-08-26 VITALS — BP 120/68 | HR 64 | Ht 68.0 in | Wt 215.0 lb

## 2015-08-26 DIAGNOSIS — R011 Cardiac murmur, unspecified: Secondary | ICD-10-CM

## 2015-08-26 DIAGNOSIS — G4733 Obstructive sleep apnea (adult) (pediatric): Secondary | ICD-10-CM

## 2015-08-26 DIAGNOSIS — E669 Obesity, unspecified: Secondary | ICD-10-CM | POA: Diagnosis not present

## 2015-08-26 DIAGNOSIS — I10 Essential (primary) hypertension: Secondary | ICD-10-CM

## 2015-08-26 NOTE — Progress Notes (Signed)
Cardiology Office Note    Date:  08/26/2015   ID:  Edward NewerJohn A Hiscox, DOB 05/14/1945, MRN [id number]  PCP:  Hollice EspyGATES,DONNA RUTH, MD  Cardiologist:  Quintella ReichertURNER,TRACI R, MD   Chief Complaint  Patient presents with  . Sleep Apnea  . Hypertension    History of Present Illness:  Edward [name] is a 70 y.o. male with a history of OSA/HTN and obesity. He is doing well. He tolerates his CPAP well. He tolerates the nasal pillow mask and pressure well. He has minimal daytime sleepiness and feels refreshed when he gets up in the am. He does occasionally take an afternoon nap. He works in the yard for exercise and walks a few miles daily.  He occasionally has some dry mouth and does not use a chin strap. He does not have any nasal congestion with the device except in the spring with allergies    Past Medical History  Diagnosis Date  . Erectile dysfunction   . Heart murmur   . Hypercholesteremia   . HTN (hypertension)   . Gout   . OSA (obstructive sleep apnea)     AHI 43/hr now on CPAP at 10cm H2O  . Obesity   . Hearing loss     hearing aides  . Migraine headache   . Allergic rhinitis   . Diverticulosis   . GERD (gastroesophageal reflux disease)     with Barrett's esophagus  . Colon polyps   . Chronic kidney disease     kidney stones    Past Surgical History  Procedure Laterality Date  . Laser assisted uvuloplasty      Current Medications: Outpatient Prescriptions Prior to Visit  Medication Sig Dispense Refill  . allopurinol (ZYLOPRIM) 300 MG tablet Take 300 mg by mouth daily.    Marland Kitchen. amLODipine-benazepril (LOTREL) 5-10 MG per capsule Take 1 capsule by mouth daily.    . Biotin 1000 MCG tablet Take 1,000 mcg by mouth daily.     . cetirizine (ZYRTEC) 10 MG tablet Take 10 mg by mouth daily.    . diclofenac (VOLTAREN) 75 MG EC tablet Take 1 tablet (75 mg total) by mouth 2 (two) times daily. 50 tablet 2  . fish oil-omega-3 fatty acids 1000 MG capsule Take 1 g by mouth 2 (two) times daily.       . indomethacin (INDOCIN) 50 MG capsule Take 50 mg by mouth daily as needed (pain).     . Multiple Vitamin (MULTIVITAMIN) capsule Take 1 capsule by mouth daily.    Marland Kitchen. omeprazole (PRILOSEC) 20 MG capsule Take 20 mg by mouth daily.    Marland Kitchen. aspirin 81 MG tablet Take 81 mg by mouth daily.    Marland Kitchen. atorvastatin (LIPITOR) 10 MG tablet Take 10 mg by mouth daily.    . cholecalciferol (VITAMIN D) 1000 UNITS tablet Take 1,000 Units by mouth daily.     No facility-administered medications prior to visit.     Allergies:   Review of patient's allergies indicates no known allergies.   Social History   Social History  . Marital Status: Married    Spouse Name: N/A  . Number of Children: N/A  . Years of Education: N/A   Social History Main Topics  . Smoking status: Never Smoker   . Smokeless tobacco: Never Used  . Alcohol Use: 0.0 oz/week    0 Standard drinks or equivalent per week     Comment: rare wine  . Drug Use: Not on file  . Sexual Activity:  Not on file   Other Topics Concern  . Not on file   Social History Narrative     Family History:  The patient's family history includes Heart attack in his father; Heart disease in his brother and father; Hypertension in his brother, brother, mother, and sister.   ROS:   Please see the history of present illness.    ROS All other systems reviewed and are negative.   PHYSICAL EXAM:   VS:  BP 120/68 mmHg  Pulse 64  Ht  (1.727 m)  Wt 215 lb (97.523 kg)  BMI 32.70 kg/m2   GEN: Well nourished, well developed, in no acute distress HEENT: normal Neck: no JVD, carotid bruits, or masses Cardiac: RRR; no rubs, or gallops,no edema.  Intact distal pulses bilaterally.   2/6 SM at RUSB to LLSB Respiratory:  clear to auscultation bilaterally, normal work of breathing GI: soft, nontender, nondistended, + BS MS: no deformity or atrophy Skin: warm and dry, no rash Neuro:  Alert and Oriented x 3, Strength and sensation are intact Psych: euthymic mood,  full affect  Wt Readings from Last 3 Encounters:  08/26/15 215 lb (97.523 kg)  09/08/14 237 lb 6.4 oz (107.684 kg)  03/03/14 238 lb (107.956 kg)      Studies/Labs Reviewed:   EKG:  EKG is not ordered today.   Recent Labs: No results found for requested labs within last 365 days.   Lipid Panel No results found for: CHOL, TRIG, HDL, CHOLHDL, VLDL, LDLCALC, LDLDIRECT  Additional studies/ records that were reviewed today include:  CPAP download    ASSESSMENT:    1. Obstructive sleep apnea   2. Essential hypertension   3. Obesity   4. Heart murmur      PLAN:  In order of problems listed above:  OSA - the patient is tolerating PAP therapy well without any problems. The PAP download was reviewed today and showed an AHI of 2/hr on 10 cm H2O with 99% compliance in using more than 4 hours nightly.  The patient has been using and benefiting from CPAP use and will continue to benefit from therapy.  HTN - BP well controlled on current medical regimen. Continue Lotrel Obesity - I have encouraged him to get into a routine exercise program and cut back on carbs and portions.  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  He has not had an echo in years so will repeat echo.  Followup with me in 1 year   Medication Adjustments/Labs and Tests Ordered: Current medicines are reviewed at length with the patient today.  Concerns regarding medicines are outlined above.  Medication changes, Labs and Tests ordered today are listed in the Patient Instructions below.   Harlon Flor, MD  08/26/2015 11:14 AM    [facility] [address] [place], [place], [place]  [address] Phone: [phone]; Fax: [phone]

## 2015-08-26 NOTE — Patient Instructions (Signed)

## 2015-08-27 ENCOUNTER — Encounter: Payer: Self-pay | Admitting: Cardiology

## 2015-08-31 ENCOUNTER — Other Ambulatory Visit: Payer: Self-pay

## 2015-08-31 ENCOUNTER — Ambulatory Visit (HOSPITAL_COMMUNITY): Payer: Medicare Other | Attending: Internal Medicine

## 2015-08-31 DIAGNOSIS — E785 Hyperlipidemia, unspecified: Secondary | ICD-10-CM | POA: Diagnosis not present

## 2015-08-31 DIAGNOSIS — R011 Cardiac murmur, unspecified: Secondary | ICD-10-CM | POA: Diagnosis not present

## 2015-08-31 DIAGNOSIS — E669 Obesity, unspecified: Secondary | ICD-10-CM | POA: Insufficient documentation

## 2015-08-31 DIAGNOSIS — Z6832 Body mass index (BMI) 32.0-32.9, adult: Secondary | ICD-10-CM | POA: Insufficient documentation

## 2015-08-31 DIAGNOSIS — I10 Essential (primary) hypertension: Secondary | ICD-10-CM | POA: Insufficient documentation

## 2015-08-31 DIAGNOSIS — G4733 Obstructive sleep apnea (adult) (pediatric): Secondary | ICD-10-CM | POA: Insufficient documentation

## 2015-08-31 DIAGNOSIS — I351 Nonrheumatic aortic (valve) insufficiency: Secondary | ICD-10-CM | POA: Diagnosis not present

## 2015-09-08 ENCOUNTER — Other Ambulatory Visit (HOSPITAL_COMMUNITY): Payer: Medicare Other

## 2015-10-15 ENCOUNTER — Ambulatory Visit (INDEPENDENT_AMBULATORY_CARE_PROVIDER_SITE_OTHER): Payer: Medicare Other | Admitting: Neurology

## 2015-10-15 ENCOUNTER — Encounter: Payer: Self-pay | Admitting: Neurology

## 2015-10-15 VITALS — BP 140/74 | HR 62 | Ht 68.0 in | Wt 217.0 lb

## 2015-10-15 DIAGNOSIS — E538 Deficiency of other specified B group vitamins: Secondary | ICD-10-CM | POA: Diagnosis not present

## 2015-10-15 DIAGNOSIS — H532 Diplopia: Secondary | ICD-10-CM

## 2015-10-15 DIAGNOSIS — R5382 Chronic fatigue, unspecified: Secondary | ICD-10-CM | POA: Diagnosis not present

## 2015-10-15 HISTORY — DX: Diplopia: H53.2

## 2015-10-15 MED ORDER — PYRIDOSTIGMINE BROMIDE 60 MG PO TABS: 30.0000 mg | ORAL_TABLET | Freq: Three times a day (TID) | ORAL | Status: DC

## 2015-10-15 NOTE — Progress Notes (Signed)
Reason for visit: Double vision  Referring physician: Dr. Liston Albaanner  Edward [name] is a 70 y.o. male  History of present illness:  Edward [name] is a 70 year old right-handed white male with a 5-6 week history of episodes of double vision and ptosis. The patient reports that the double vision is worse when he looks down and to the left. There is a vertical separation. He has had trouble with depth perception secondary to this. The patient reports that his eyelids may become droopy, particularly on the left side. He denies any weakness of the arms or legs, and he denies any numbness of the extremities. The patient has not had any problems with chewing or swallowing and he has not reported any shortness of breath. He has noted that heat exposure worsens his symptoms, when he put ice packs on the eyes he can improve the symptoms transiently. The patient denies any general fatigue, he denies problems with balance or difficulty controlling the bowels or the bladder. He was seen by his ophthalmologist, and he is referred to this office for evaluation of possible myasthenia gravis.  Past Medical History  Diagnosis Date  . Erectile dysfunction   . Heart murmur   . Hypercholesteremia   . HTN (hypertension)   . Gout   . OSA (obstructive sleep apnea)     AHI 43/hr now on CPAP at 10cm H2O  . Obesity   . Hearing loss     hearing aides  . Migraine headache   . Allergic rhinitis   . Diverticulosis   . GERD (gastroesophageal reflux disease)     with Barrett's esophagus  . Colon polyps   . Chronic kidney disease     kidney stones  . Diplopia 10/15/2015    Past Surgical History  Procedure Laterality Date  . Laser assisted uvuloplasty      Family History  Problem Relation Age of Onset  . Hypertension Mother   . Heart attack Father   . Heart disease Father   . Hypertension Sister   . Heart disease Brother   . Hypertension Brother   . Hypertension Brother   . Anemia Brother   . Cancer  Brother     Social history:  reports that he has never smoked. He has never used smokeless tobacco. He reports that he drinks alcohol. He reports that he does not use illicit drugs.  Medications:  Prior to Admission medications   Medication Sig Start Date End Date Taking? Authorizing Provider  allopurinol (ZYLOPRIM) 300 MG tablet Take 300 mg by mouth daily.    Historical Provider, MD  amLODipine-benazepril (LOTREL) 5-10 MG per capsule Take 1 capsule by mouth daily.    Historical Provider, MD  Biotin 1000 MCG tablet Take 2,500 mcg by mouth daily.     Historical Provider, MD  cetirizine (ZYRTEC) 10 MG tablet Take 10 mg by mouth daily.    Historical Provider, MD  diclofenac (VOLTAREN) 75 MG EC tablet Take 1 tablet (75 mg total) by mouth 2 (two) times daily. 11/27/13   Lenn SinkNorman S Regal, DPM  fish oil-omega-3 fatty acids 1000 MG capsule Take 1 g by mouth 2 (two) times daily.     Historical Provider, MD  indomethacin (INDOCIN) 50 MG capsule Take 50 mg by mouth daily as needed (pain).     Historical Provider, MD  Multiple Vitamin (MULTIVITAMIN) capsule Take 1 capsule by mouth daily.    Historical Provider, MD  omeprazole (PRILOSEC) 20 MG capsule Take 20 mg  by mouth daily.    Historical Provider, MD  pravastatin (PRAVACHOL) 40 MG tablet Take 40 mg by mouth daily. 07/24/15   Historical Provider, MD     No Known Allergies  ROS:  Out of a complete 14 system review of symptoms, the patient complains only of the following symptoms, and all other reviewed systems are negative.  Blurred vision, double vision Joint pain, joint swelling  Blood pressure 140/74, pulse 62, height 5\' 8"  (1.727 m), weight 217 lb (98.431 kg).  Physical Exam  General: The patient is alert and cooperative at the time of the examination.  Eyes: Pupils are equal, round, and reactive to light. Discs are flat bilaterally.  Neck: The neck is supple, no carotid bruits are noted.  Respiratory: The respiratory examination is  clear.  Cardiovascular: The cardiovascular examination reveals a regular rate and rhythm, a grade I/VI SEM is noted in the aortic area.  Skin: Extremities are without significant edema.  Neurologic Exam  Mental status: The patient is alert and oriented x 3 at the time of the examination. The patient has apparent normal recent and remote memory, with an apparently normal attention span and concentration ability.  Cranial nerves: Facial symmetry is not present. Slight ptosis of the left eye is noted. There is good sensation of the face to pinprick and soft touch bilaterally. The strength of the facial muscles and the muscles to head turning and shoulder shrug are normal bilaterally. Speech is well enunciated, no aphasia or dysarthria is noted. Extraocular movements are full, but the patient reports some double vision with looking down into the left. Visual fields are full. The tongue is midline, and the patient has symmetric elevation of the soft palate. No obvious hearing deficits are noted. With superior gaze for 1 minute, the patient does not report any double vision, but increased left-sided ptosis is noted.  Motor: The motor testing reveals 5 over 5 strength of all 4 extremities. Good symmetric motor tone is noted throughout. With arms outstretched 1 minute, the patient has no fatigable weakness of the deltoid muscles.  Sensory: Sensory testing is intact to pinprick, soft touch, vibration sensation, and position sense on all 4 extremities, with exception of some decrease in vibration sensation on the left foot and position sensation decrease bilaterally on the feet. No evidence of extinction is noted.  Coordination: Cerebellar testing reveals good finger-nose-finger and heel-to-shin bilaterally.  Gait and station: Gait is normal. Tandem gait is normal. Romberg is negative. No drift is seen.  Reflexes: Deep tendon reflexes are symmetric and normal bilaterally. Toes are downgoing  bilaterally.   Assessment/Plan:  1. 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 He reports a gradual onset of double vision, ptosis that is intermittent and worse with fatigue. The double vision is also worse with heat, better with ice. The patient will be set up for MRI of the brain, blood work will be done today. The patient will be started on Mestinon taking 30 mg 3 times daily. He will follow-up in 3 months, sooner if needed.  Edward Palau. Keith Willis MD 10/15/2015 9:03 PM  [facility] [address] [place], [place] [id number]  Phone [phone] Fax [phone]

## 2015-10-19 ENCOUNTER — Encounter: Payer: Self-pay | Admitting: Neurology

## 2015-10-19 ENCOUNTER — Telehealth: Payer: Self-pay | Admitting: Neurology

## 2015-10-19 DIAGNOSIS — G7000 Myasthenia gravis without (acute) exacerbation: Secondary | ICD-10-CM

## 2015-10-19 LAB — CBC WITH DIFFERENTIAL/PLATELET
Basophils Absolute: 0.1 10*3/uL (ref 0.0–0.2)
Basos: 1 %
EOS (ABSOLUTE): 0.3 10*3/uL (ref 0.0–0.4)
Eos: 4 %
Hematocrit: 43.9 % (ref 37.5–51.0)
Hemoglobin: 14.5 g/dL (ref 12.6–17.7)
IMMATURE GRANS (ABS): 0 10*3/uL (ref 0.0–0.1)
IMMATURE GRANULOCYTES: 0 %
LYMPHS: 23 %
Lymphocytes Absolute: 1.8 10*3/uL (ref 0.7–3.1)
MCH: 28.9 pg (ref 26.6–33.0)
MCHC: 33 g/dL (ref 31.5–35.7)
MCV: 88 fL (ref 79–97)
Monocytes Absolute: 0.6 10*3/uL (ref 0.1–0.9)
Monocytes: 7 %
NEUTROS PCT: 65 %
Neutrophils Absolute: 4.9 10*3/uL (ref 1.4–7.0)
PLATELETS: 214 10*3/uL (ref 150–379)
RBC: 5.02 x10E6/uL (ref 4.14–5.80)
RDW: 14.6 % (ref 12.3–15.4)
WBC: 7.7 10*3/uL (ref 3.4–10.8)

## 2015-10-19 LAB — COMPREHENSIVE METABOLIC PANEL
A/G RATIO: 1.5 (ref 1.2–2.2)
ALK PHOS: 54 IU/L (ref 39–117)
ALT: 21 IU/L (ref 0–44)
AST: 25 IU/L (ref 0–40)
Albumin: 4.3 g/dL (ref 3.6–4.8)
BUN/Creatinine Ratio: 26 — ABNORMAL HIGH (ref 10–24)
BUN: 30 mg/dL — ABNORMAL HIGH (ref 8–27)
Bilirubin Total: 0.3 mg/dL (ref 0.0–1.2)
CALCIUM: 9.7 mg/dL (ref 8.6–10.2)
CO2: 21 mmol/L (ref 18–29)
CREATININE: 1.16 mg/dL (ref 0.76–1.27)
Chloride: 102 mmol/L (ref 96–106)
GFR calc Af Amer: 74 mL/min/{1.73_m2} (ref >59)
GFR, EST NON AFRICAN AMERICAN: 64 mL/min/{1.73_m2} (ref >59)
GLOBULIN, TOTAL: 2.9 g/dL (ref 1.5–4.5)
Glucose: 111 mg/dL — ABNORMAL HIGH (ref 65–99)
POTASSIUM: 5.2 mmol/L (ref 3.5–5.2)
SODIUM: 141 mmol/L (ref 134–144)
Total Protein: 7.2 g/dL (ref 6.0–8.5)

## 2015-10-19 LAB — TSH: TSH: 1.5 u[IU]/mL (ref 0.450–4.500)

## 2015-10-19 LAB — ACETYLCHOLINE RECEPTOR, BINDING: AChR Binding Ab, Serum: 16.1 nmol/L — ABNORMAL HIGH (ref 0.00–0.24)

## 2015-10-19 LAB — SEDIMENTATION RATE: SED RATE: 27 mm/h (ref 0–30)

## 2015-10-19 LAB — VITAMIN B12: VITAMIN B 12: 552 pg/mL (ref 211–946)

## 2015-10-19 LAB — ANA W/REFLEX: ANA: NEGATIVE

## 2015-10-19 LAB — ANGIOTENSIN CONVERTING ENZYME

## 2015-10-19 MED ORDER — PREDNISONE 5 MG PO TABS
ORAL_TABLET | ORAL | Status: DC
Start: 2015-10-19 — End: 2016-01-19

## 2015-10-19 NOTE — Telephone Encounter (Signed)
I called patient, talk with the wife. The blood work confirms the diagnosis of myasthenia gravis. I'll get him started on low-dose prednisone, he may go up on the Mestinon dose if he needs to. I will order a CT scan of the chest, cancel the MRI of the brain that was ordered.

## 2015-10-28 ENCOUNTER — Telehealth: Payer: Self-pay | Admitting: Neurology

## 2015-10-28 NOTE — Telephone Encounter (Signed)
Pt's wife called wanting to know about status of CT appt. They prefer to go to [place]. Please call and advise

## 2015-10-30 NOTE — Telephone Encounter (Signed)
Pt's wife called about status of CT appt. Please call and advise

## 2015-11-02 ENCOUNTER — Ambulatory Visit (HOSPITAL_BASED_OUTPATIENT_CLINIC_OR_DEPARTMENT_OTHER)
Admission: RE | Admit: 2015-11-02 | Discharge: 2015-11-02 | Disposition: A | Discharge status: Home / Self Care | Payer: Medicare Other | Source: Ambulatory Visit | Attending: Neurology | Admitting: Neurology

## 2015-11-02 ENCOUNTER — Encounter (HOSPITAL_BASED_OUTPATIENT_CLINIC_OR_DEPARTMENT_OTHER): Payer: Self-pay

## 2015-11-02 ENCOUNTER — Other Ambulatory Visit: Payer: Self-pay | Admitting: Neurology

## 2015-11-02 ENCOUNTER — Telehealth: Payer: Self-pay | Admitting: Neurology

## 2015-11-02 DIAGNOSIS — I700 Atherosclerosis of aorta: Secondary | ICD-10-CM | POA: Insufficient documentation

## 2015-11-02 DIAGNOSIS — G7000 Myasthenia gravis without (acute) exacerbation: Secondary | ICD-10-CM | POA: Insufficient documentation

## 2015-11-02 DIAGNOSIS — I2510 Atherosclerotic heart disease of native coronary artery without angina pectoris: Secondary | ICD-10-CM | POA: Insufficient documentation

## 2015-11-02 MED ORDER — IOPAMIDOL (ISOVUE-300) INJECTION 61%
80.0000 mL | Freq: Once | INTRAVENOUS | Status: AC | PRN
  Administered 2015-11-02: 80 mL via INTRAVENOUS

## 2015-11-02 NOTE — Telephone Encounter (Signed)
I called patient. CT of the chest did not show evidence of a thymoma. The patient is on prednisone and Mestinon, and he is not getting any better with the double vision over the next several weeks, he is to contact our office.   CT chest 11/02/15:  IMPRESSION: Coronary artery calcifications suggesting Coronary artery disease.  Thoracic aortic atherosclerosis is noted without aneurysm or dissection.  No evidence of mediastinal mass or adenopathy. No evidence of thymoma.

## 2015-11-02 NOTE — Telephone Encounter (Signed)
Spoke with patients wife and she requested to go to [place].

## 2015-11-09 ENCOUNTER — Encounter: Payer: Self-pay | Admitting: Neurology

## 2015-11-10 NOTE — Telephone Encounter (Signed)
He has the frequent muscle cramping could due to side effect of Mestinon, on his recent evaluation October 15 2015, only mild ptosis, diplopia, no significant bulbar or limb muscle weakness,  It is okay to stop Mestinon, to see if his muscle cramping improved,

## 2015-11-10 NOTE — Telephone Encounter (Addendum)
Returned call to pt's wife. She said they did receive CT results via mssg left on cell phone. However, pt has been having severe leg cramps w/ intermittent muscle twitching. They are questioning if it is related to myasthenia gravis or medication side effect. He is currently taking Prednisone 5 mg BID and Mestinon 30 mg TID. Would like recommendation on what to do as cramps are keeping him awake at night.

## 2015-11-10 NOTE — Telephone Encounter (Signed)
Returned call and spoke to pt's wife. York SpanielSaid that a friend of theirs w/ MG takes Tegretol for cramps. She/pt agreed to hold bedtime medication tonight and will call back tomorrow w/ update on nighttime cramps.

## 2015-11-10 NOTE — Telephone Encounter (Signed)
Pt's wife returning Dr Anne HahnWillis call erg CT results. Please call

## 2015-11-11 NOTE — Telephone Encounter (Signed)
I called patient. The patient is having ongoing issues with cramps. He is to stop the Mestinon, we will use prednisone, and eventually convert to CellCept for the control of the myasthenia.

## 2015-11-11 NOTE — Telephone Encounter (Addendum)
Returned call to pt's wife and advised that pt hold mestinon per Dr. Terrace ArabiaYan as cramping did improve w/o bedtime dose last night. They would like Dr. Anne HahnWillis' advise on a different medication or dosing schedule.

## 2015-11-11 NOTE — Telephone Encounter (Signed)
Wife called this morning per nurse Jennifer's instructions to advise, husband didn't take Mestinon last night and didn't have cramps. States husband hasn't taken his morning dose. Please call [phone] or cell [phone].

## 2016-01-19 ENCOUNTER — Ambulatory Visit (INDEPENDENT_AMBULATORY_CARE_PROVIDER_SITE_OTHER): Payer: Medicare Other | Admitting: Neurology

## 2016-01-19 ENCOUNTER — Encounter: Payer: Self-pay | Admitting: Neurology

## 2016-01-19 DIAGNOSIS — G7000 Myasthenia gravis without (acute) exacerbation: Secondary | ICD-10-CM

## 2016-01-19 MED ORDER — PREDNISONE 20 MG PO TABS: 20.0000 mg | ORAL_TABLET | Freq: Every day | ORAL | 3 refills | Status: DC

## 2016-01-19 NOTE — Progress Notes (Signed)
Reason for visit: Myasthenia gravis  Edward [name] is an 70 y.o. male  History of present illness:  Edward [name] is a 70 year old right-handed white male with a history of primarily ocular myasthenia gravis. The patient has been on a relatively low dose of prednisone taking 10 mg daily, he is doing relatively well with this. The patient will notice some double vision when he becomes tired or fatigued. He has not been able to tolerate Mestinon secondary to muscle cramps, but he indicates that he has had some muscle cramps prior to starting the Mestinon, and he is on a statin drug. The patient may note significant fatigue at times. He denies any problems with chewing or swallowing, he denies any weakness of the extremities. He likes to perform wood carving, this activity is producing double vision after 3 or 4 hours of work. He may also have some difficulty with TV watching. He returns for an evaluation.  Past Medical History:  Diagnosis Date  . Allergic rhinitis   . Chronic kidney disease    kidney stones  . Colon polyps   . Diplopia 10/15/2015  . Diverticulosis   . Erectile dysfunction   . GERD (gastroesophageal reflux disease)    with Barrett's esophagus  . Gout   . Hearing loss    hearing aides  . Heart murmur   . HTN (hypertension)   . Hypercholesteremia   . Migraine headache   . Myasthenia gravis (HCC)   . Obesity   . OSA (obstructive sleep apnea)    AHI 43/hr now on CPAP at 10cm H2O    Past Surgical History:  Procedure Laterality Date  . laser assisted uvuloplasty      Family History  Problem Relation Age of Onset  . Hypertension Mother   . Heart attack Father   . Heart disease Father   . Hypertension Sister   . Heart disease Brother   . Hypertension Brother   . Hypertension Brother   . Anemia Brother   . Cancer Brother     Social history:  reports that he has never smoked. He has never used smokeless tobacco. He reports that he drinks alcohol. He reports that  he does not use drugs.   No Known Allergies  Medications:  Prior to Admission medications   Medication Sig Start Date End Date Taking? Authorizing Provider  allopurinol (ZYLOPRIM) 300 MG tablet Take 300 mg by mouth daily.   Yes Historical Provider, MD  amLODipine-benazepril (LOTREL) 5-10 MG per capsule Take 1 capsule by mouth daily.   Yes Historical Provider, MD  Biotin 1000 MCG tablet Take 2,500 mcg by mouth daily.    Yes Historical Provider, MD  cetirizine (ZYRTEC) 10 MG tablet Take 10 mg by mouth daily.   Yes Historical Provider, MD  diclofenac (VOLTAREN) 75 MG EC tablet Take 1 tablet (75 mg total) by mouth 2 (two) times daily. 11/27/13  Yes Lenn Sink, DPM  fish oil-omega-3 fatty acids 1000 MG capsule Take 1 g by mouth 2 (two) times daily.    Yes Historical Provider, MD  indomethacin (INDOCIN) 50 MG capsule Take 50 mg by mouth daily as needed (pain).    Yes Historical Provider, MD  Multiple Vitamin (MULTIVITAMIN) capsule Take 1 capsule by mouth daily.   Yes Historical Provider, MD  omeprazole (PRILOSEC) 20 MG capsule Take 20 mg by mouth daily.   Yes Historical Provider, MD  pravastatin (PRAVACHOL) 40 MG tablet Take 40 mg by mouth daily. 07/24/15  Yes Historical Provider, MD  predniSONE (DELTASONE) 5 MG tablet 1 tablet daily for 2 weeks, then take 2 tablets daily 10/19/15  Yes York Spanielharles K Willis, MD  pyridostigmine (MESTINON) 60 MG tablet Take 0.5 tablets (30 mg total) by mouth 3 (three) times daily. 10/15/15  Yes York Spanielharles K Willis, MD    ROS:  Out of a complete 14 system review of symptoms, the patient complains only of the following symptoms, and all other reviewed systems are negative.  Weight gain Blurred vision Heat intolerance Sleep apnea Achy muscles, muscle cramps Dizziness  Blood pressure 110/76, pulse 77, height 5\' 8"  (1.727 m), weight 228 lb (103.4 kg).  Physical Exam  General: The patient is alert and cooperative at the time of the examination. The patient is moderately  obese.  Skin: No significant peripheral edema is noted.   Neurologic Exam  Mental status: The patient is alert and oriented x 3 at the time of the examination. The patient has apparent normal recent and remote memory, with an apparently normal attention span and concentration ability.   Cranial nerves: Facial symmetry is present. Speech is normal, no aphasia or dysarthria is noted. Extraocular movements are full. Visual fields are full. With superior gaze for 1 minute, the patient reports no subjective double vision, no divergence of gaze or increased ptosis is seen.  Motor: The patient has good strength in all 4 extremities. With arms outstretched 1 minute, no fatigable weakness of the deltoid muscles is noted.  Sensory examination: Soft touch sensation is symmetric on the face, arms, and legs.  Coordination: The patient has good finger-nose-finger and heel-to-shin bilaterally.  Gait and station: The patient has a normal gait. Tandem gait is unsteady. Romberg is negative. No drift is seen.  Reflexes: Deep tendon reflexes are symmetric.   Assessment/Plan:  1. Myasthenia gravis, ocular features  The patient is having minimal symptoms with the myasthenia at this time. He does report frequent bowel movements, he only takes 30 mg a Mestinon daily. I am not sure that the bowel issue is related to the Mestinon. The patient will go up on the prednisone taking 20 mg daily. Will follow-up in 4 months, if he is doing well we may stick with the prednisone only, if he is still having symptoms, we will add a low-dose of CellCept.  Edward Palau. Keith Willis MD 01/19/2016 11:02 AM  [facility] [address] [place], [place] [id number]  Phone [phone] Fax [phone]

## 2016-04-07 ENCOUNTER — Other Ambulatory Visit: Payer: Self-pay | Admitting: Neurology

## 2016-05-24 ENCOUNTER — Ambulatory Visit (INDEPENDENT_AMBULATORY_CARE_PROVIDER_SITE_OTHER): Payer: Medicare Other | Admitting: Neurology

## 2016-05-24 ENCOUNTER — Encounter: Payer: Self-pay | Admitting: Neurology

## 2016-05-24 VITALS — BP 131/80 | HR 70 | Ht 68.0 in | Wt 239.0 lb

## 2016-05-24 DIAGNOSIS — G7000 Myasthenia gravis without (acute) exacerbation: Secondary | ICD-10-CM | POA: Diagnosis not present

## 2016-05-24 DIAGNOSIS — H532 Diplopia: Secondary | ICD-10-CM

## 2016-05-24 NOTE — Progress Notes (Signed)
Reason for visit: Myasthenia gravis  Edward [name] is an 71 y.o. male  History of present illness:  [name] is a 71 year old right-handed white male with a history of myasthenia gravis primarily with ocular features. The patient has had an increase in the prednisone dosing, this seems to have helped his double vision and ptosis issue. The patient does report some generalized fatigue problems, he is able to do yard work only for an hour or 2 before he gets extremely fatigued. The patient is only taking 30 mg of Mestinon daily, he has had muscle cramps on this medication and on his statin drug in the past, but his muscle cramps have improved recently. The patient denies any problems with chewing or swallowing. He is able to get up out of a chair or walk up a flight of stairs without any significant weakness. He returns to this office for an evaluation.   Past Medical History:  Diagnosis Date  . Allergic rhinitis   . Chronic kidney disease    kidney stones  . Colon polyps   . Diplopia 10/15/2015  . Diverticulosis   . Erectile dysfunction   . GERD (gastroesophageal reflux disease)    with Barrett's esophagus  . Gout   . Hearing loss    hearing aides  . Heart murmur   . HTN (hypertension)   . Hypercholesteremia   . Migraine headache   . Myasthenia gravis (HCC)   . Obesity   . OSA (obstructive sleep apnea)    AHI 43/hr now on CPAP at 10cm H2O    Past Surgical History:  Procedure Laterality Date  . laser assisted uvuloplasty      Family History  Problem Relation Age of Onset  . Hypertension Mother   . Heart attack Father   . Heart disease Father   . Hypertension Sister   . Heart disease Brother   . Hypertension Brother   . Hypertension Brother   . Anemia Brother   . Cancer Brother     Social history:  reports that he has never smoked. He has never used smokeless tobacco. He reports that he drinks alcohol. He reports that he does not use drugs.   No Known  Allergies  Medications:  Prior to Admission medications   Medication Sig Start Date End Date Taking? Authorizing Provider  allopurinol (ZYLOPRIM) 300 MG tablet Take 300 mg by mouth daily.   Yes Historical Provider, MD  amLODipine-benazepril (LOTREL) 5-10 MG per capsule Take 1 capsule by mouth daily.   Yes Historical Provider, MD  Biotin 1000 MCG tablet Take 2,500 mcg by mouth daily.    Yes Historical Provider, MD  cetirizine (ZYRTEC) 10 MG tablet Take 10 mg by mouth daily.   Yes Historical Provider, MD  diclofenac (VOLTAREN) 75 MG EC tablet Take 1 tablet (75 mg total) by mouth 2 (two) times daily. 11/27/13  Yes Lenn SinkNorman S Regal, DPM  fish oil-omega-3 fatty acids 1000 MG capsule Take 1 g by mouth 2 (two) times daily.    Yes Historical Provider, MD  indomethacin (INDOCIN) 50 MG capsule Take 50 mg by mouth daily as needed (pain).    Yes Historical Provider, MD  Multiple Vitamin (MULTIVITAMIN) capsule Take 1 capsule by mouth daily.   Yes Historical Provider, MD  omeprazole (PRILOSEC) 20 MG capsule Take 20 mg by mouth daily.   Yes Historical Provider, MD  pravastatin (PRAVACHOL) 40 MG tablet Take 40 mg by mouth daily. 07/24/15  Yes Historical Provider, MD  predniSONE (DELTASONE) 20 MG tablet Take 1 tablet (20 mg total) by mouth daily. 01/19/16  Yes York Spaniel, MD  pyridostigmine (MESTINON) 60 MG tablet TAKE 1/2 TABLET 3 TIMES DAILY 04/07/16  Yes York Spaniel, MD    ROS:  Out of a complete 14 system review of symptoms, the patient complains only of the following symptoms, and all other reviewed systems are negative.  Weight gain Fatigue  Blood pressure 131/80, pulse 70, height 5\' 8"  (1.727 m), weight 239 lb (108.4 kg).  Physical Exam  General: The patient is alert and cooperative at the time of the examination. The patient is moderately obese.  Skin: No significant peripheral edema is noted.   Neurologic Exam  Mental status: The patient is alert and oriented x 3 at the time of the  examination. The patient has apparent normal recent and remote memory, with an apparently normal attention span and concentration ability.   Cranial nerves: Facial symmetry is present. Speech is normal, no aphasia or dysarthria is noted. Extraocular movements are full. Visual fields are full. With superior gaze for 1 minute, no divergence of gaze or ptosis is noted, the patient does have some mild subjective sense of double vision after 20 seconds.  Motor: The patient has good strength in all 4 extremities. With arms outstretched 1 minute, no fatigable weakness is noted.  Sensory examination: Soft touch sensation is symmetric on the face, arms, and legs.  Coordination: The patient has good finger-nose-finger and heel-to-shin bilaterally.  Gait and station: The patient has a normal gait. Tandem gait is normal. Romberg is negative. No drift is seen.  Reflexes: Deep tendon reflexes are symmetric.   Assessment/Plan:  1. Myasthenia gravis  The patient is complaining of some generalized fatigue which is common with myasthenia, but it is difficult to treat with medications. The patient will try increasing the Mestinon slightly to half a tablet twice or 3 times daily, but this helps the fatigue issue, we will try to reduce the prednisone dose, I would like to get him down eventually to 15 mg daily. If the need for the prednisone actually goes up, we may need to add CellCept or Imuran. He will follow-up in about 5 months.  Marlan Palau MD 05/24/2016 11:14 AM  [facility] [address] [place], [place] [address]  Phone [phone] Fax [phone]

## 2016-06-14 ENCOUNTER — Telehealth: Payer: Self-pay | Admitting: Neurology

## 2016-06-14 MED ORDER — PYRIDOSTIGMINE BROMIDE 60 MG PO TABS: ORAL_TABLET | ORAL | 5 refills | Status: DC

## 2016-06-14 MED ORDER — PREDNISONE 10 MG PO TABS: 10.0000 mg | ORAL_TABLET | Freq: Every day | ORAL | 1 refills | Status: DC

## 2016-06-14 MED ORDER — PREDNISONE 5 MG PO TABS: ORAL_TABLET | ORAL | 3 refills | Status: DC

## 2016-06-14 NOTE — Telephone Encounter (Signed)
Dr Willis- please advise 

## 2016-06-14 NOTE — Telephone Encounter (Signed)
I called patient, talk with the wife. The patient seems to be doing relatively well this time, he is interested in cutting back on the prednisone. He is on the Mestinon taking 60 mg tablet, one half tablet twice a day, he can go up to one half tablet 3 times a day. I will call in another prescription.  With the prednisone, we will go to 17.5 mg daily for 2 months, then go to 15 mg daily.

## 2016-06-14 NOTE — Telephone Encounter (Signed)
Pt says Dr Lacretia NicksW discussed tapering his medications. Pt is wanting to know if he could increase pyridostigmine (MESTINON) 60 MG tablet to 2 tabs/day. (He has enough of the current dose for the rest of this week) New RX needs to be a 90 day supply. He also is ready to decrease predniSONE (DELTASONE) 20 MG tablet to 10mg . Please call to discuss

## 2016-06-27 ENCOUNTER — Other Ambulatory Visit: Payer: Self-pay | Admitting: Family Medicine

## 2016-06-27 ENCOUNTER — Ambulatory Visit
Admission: RE | Admit: 2016-06-27 | Discharge: 2016-06-27 | Disposition: A | Discharge status: Home / Self Care | Payer: Medicare Other | Source: Ambulatory Visit | Attending: Family Medicine | Admitting: Family Medicine

## 2016-06-27 DIAGNOSIS — M7989 Other specified soft tissue disorders: Secondary | ICD-10-CM

## 2016-07-04 ENCOUNTER — Telehealth: Payer: Self-pay | Admitting: Neurology

## 2016-07-04 NOTE — Telephone Encounter (Signed)
Pt's wife says the pt developed a blood clot in the left leg on 06/24/16 and was placed on xelerto. She is concerned about the drug interaction with the medications he is taking for MG. She wants to know if there is another blood thinner Dr Lacretia NicksW  would recommend possibly warfarin. He seems better today but he is experiencing some leg pain from the knee down. She is wanting to r/u if pyridostigmine (MESTINON) 60 MG tablet could be the cause. She is aware the clinic closes at noon today

## 2016-07-04 NOTE — Telephone Encounter (Signed)
Dr Willis- please advise 

## 2016-07-04 NOTE — Telephone Encounter (Signed)
I called the patient, talk with the wife. The patient has sustained a DVT. There is no interaction with the Mestinon and the Xarelto. The patient will come down to 15 mg of prednisone after one month.

## 2016-07-19 ENCOUNTER — Telehealth: Payer: Self-pay | Admitting: Cardiology

## 2016-07-19 NOTE — Telephone Encounter (Signed)
New Message  Pt call requesting to speak with RN. Per pt has some questions about the medication Xarelto. Please call back to discuss

## 2016-07-19 NOTE — Telephone Encounter (Signed)
Patient's wife would like Dr. Norris Cross opinion on Xarelto.  The patient was started on Xarelto by PCP for treatment of blood clot in his leg on 06/24/16. Explained to her the differences between Xarelto and Coumadin (she is interested in Coumadin because of the reversal agent).  Also explained to her that Dr. Mayford Knife will likely agree to treatment plan for DVT by PCP. She also states the leg looks like "the swelling has moved a little to his foot." Instructed her to continue to monitor the leg and to call Dr. Kevan Ny if symptoms worsen.  She requests a call tomorrow with Dr. Norris Cross thoughts.

## 2016-07-20 NOTE — Telephone Encounter (Signed)
Per DPR form, left message that Dr. Mayford Knifeurner agrees with PCP's treatment plan for DVT. Instructed patient to call with any further questions or concerns.

## 2016-07-20 NOTE — Telephone Encounter (Signed)
Please let patient know that he should follow PCPs instructions

## 2016-07-27 ENCOUNTER — Encounter: Payer: Self-pay | Admitting: Cardiology

## 2016-07-27 ENCOUNTER — Ambulatory Visit (INDEPENDENT_AMBULATORY_CARE_PROVIDER_SITE_OTHER): Payer: Medicare Other | Admitting: Cardiology

## 2016-07-27 VITALS — BP 132/88 | Ht 69.0 in | Wt 237.8 lb

## 2016-07-27 DIAGNOSIS — I10 Essential (primary) hypertension: Secondary | ICD-10-CM | POA: Diagnosis not present

## 2016-07-27 DIAGNOSIS — G4733 Obstructive sleep apnea (adult) (pediatric): Secondary | ICD-10-CM

## 2016-07-27 DIAGNOSIS — I351 Nonrheumatic aortic (valve) insufficiency: Secondary | ICD-10-CM | POA: Diagnosis not present

## 2016-07-27 DIAGNOSIS — I359 Nonrheumatic aortic valve disorder, unspecified: Secondary | ICD-10-CM

## 2016-07-27 DIAGNOSIS — E6609 Other obesity due to excess calories: Secondary | ICD-10-CM | POA: Diagnosis not present

## 2016-07-27 HISTORY — DX: Nonrheumatic aortic valve disorder, unspecified: I35.9

## 2016-07-27 NOTE — Patient Instructions (Signed)
Medication Instructions:  Your physician recommends that you continue on your current medications as directed. Please refer to the Current Medication list given to you today.   Labwork: none  Testing/Procedures: Your physician has requested that you have an echocardiogram. Echocardiography is a painless test that uses sound waves to create images of your heart. It provides your doctor with information about the size and shape of your heart and how well your heart's chambers and valves are working. This procedure takes approximately one hour. There are no restrictions for this procedure. April 2019   Follow-Up: Your physician wants you to follow-up in: 12 months with Dr. Mayford Knife. You will receive a reminder letter in the mail two months in advance. If you don't receive a letter, please call our office to schedule the follow-up appointment.   Any Other Special Instructions Will Be Listed Below (If Applicable).     If you need a refill on your cardiac medications before your next appointment, please call your pharmacy.

## 2016-07-27 NOTE — Progress Notes (Signed)
Cardiology Office Note    Date:  07/27/2016   ID:  [name], DOB 04-04-1946, MRN [id number]  PCP:  Hollice Espy, MD  Sleep Medicine:  Armanda Magic, MD   Chief Complaint  Patient presents with  . Sleep Apnea  . Hypertension    History of Present Illness:  KEYLON LABELLE is a 71 y.o. male with a history of OSA/HTN and obesity. He is doing well. He tolerates his CPAP well. He tolerates the nasal pillow mask and pressure well. For the most part he feels refreshed when he gets up in the am. He does occasionally take an afternoon nap.  He denies any dry mouth or nasal dryness and does not use a chin strap. He does not have any nasal congestion with the device except in the spring with allergies   Past Medical History:  Diagnosis Date  . Allergic rhinitis   . Aortic insufficiency 07/27/2016   By echo  . Chronic kidney disease    kidney stones  . Colon polyps   . Diplopia 10/15/2015  . Diverticulosis   . Erectile dysfunction   . GERD (gastroesophageal reflux disease)    with Barrett's esophagus  . Gout   . Hearing loss    hearing aides  . Heart murmur   . HTN (hypertension)   . Hypercholesteremia   . Migraine headache   . Myasthenia gravis (HCC)   . Obesity   . OSA (obstructive sleep apnea)    AHI 43/hr now on CPAP at 10cm H2O    Past Surgical History:  Procedure Laterality Date  . laser assisted uvuloplasty      Current Medications: Current Meds  Medication Sig  . allopurinol (ZYLOPRIM) 300 MG tablet Take 300 mg by mouth daily.  Marland Kitchen amLODipine-benazepril (LOTREL) 5-10 MG per capsule Take 1 capsule by mouth daily.  . Biotin 1000 MCG tablet Take 2,500 mcg by mouth daily.   . cetirizine (ZYRTEC) 10 MG tablet Take 10 mg by mouth daily.  . diclofenac (VOLTAREN) 75 MG EC tablet Take 1 tablet (75 mg total) by mouth 2 (two) times daily.  . fish oil-omega-3 fatty acids 1000 MG capsule Take 1 g by mouth 2 (two) times daily.   . indomethacin (INDOCIN) 50 MG capsule Take 50  mg by mouth daily as needed (pain).   . Multiple Vitamin (MULTIVITAMIN) capsule Take 1 capsule by mouth daily.  Marland Kitchen omeprazole (PRILOSEC) 20 MG capsule Take 20 mg by mouth daily.  . pravastatin (PRAVACHOL) 40 MG tablet Take 40 mg by mouth daily.  . predniSONE (DELTASONE) 10 MG tablet Take 1 tablet (10 mg total) by mouth daily with breakfast.  . predniSONE (DELTASONE) 5 MG tablet 1.5 tablet daily for 2 months, then take 1 tablet daily  . pyridostigmine (MESTINON) 60 MG tablet Take 30 mg by mouth 3 (three) times daily.  . rivaroxaban (XARELTO) 20 MG TABS tablet Take 20 mg by mouth daily with supper.    Allergies:   Patient has no known allergies.   Social History   Social History  . Marital status: Married    Spouse name: N/A  . Number of children: 2  . Years of education: College   Occupational History  . Retired    Social History Main Topics  . Smoking status: Never Smoker  . Smokeless tobacco: Never Used  . Alcohol use 0.0 oz/week     Comment: rare wine  . Drug use: No  . Sexual activity: Not on file  Other Topics Concern  . Not on file   Social History Narrative   Lives home w/ his wife   Right-handed    Drinks about 3 cups of coffee per day     Family History:  The patient's family history includes Anemia in his brother; Cancer in his brother; Heart attack in his father; Heart disease in his brother and father; Hypertension in his brother, brother, mother, and sister.   ROS:   Please see the history of present illness.    ROS All other systems reviewed and are negative.  No flowsheet data found.     PHYSICAL EXAM:   VS:  BP 132/88   Ht  (1.753 m)   Wt 237 lb 12.8 oz (107.9 kg)   BMI 35.12 kg/m    GEN: Well nourished, well developed, in no acute distress  HEENT: normal  Neck: no JVD, carotid bruits, or masses Cardiac: RRR; no  rubs, or gallops,no edema.  Intact distal pulses bilaterally. 2/6 SM at RUSB Respiratory:  clear to auscultation bilaterally,  normal work of breathing GI: soft, nontender, nondistended, + BS MS: no deformity or atrophy  Skin: warm and dry, no rash Neuro:  Alert and Oriented x 3, Strength and sensation are intact Psych: euthymic mood, full affect  Wt Readings from Last 3 Encounters:  07/27/16 237 lb 12.8 oz (107.9 kg)  05/24/16 239 lb (108.4 kg)  01/19/16 228 lb (103.4 kg)      Studies/Labs Reviewed:   EKG:  EKG is not ordered today.    Recent Labs: 10/15/2015: ALT 21; BUN 30; Creatinine, Ser 1.16; Platelets 214; Potassium 5.2; Sodium 141; TSH 1.500   Lipid Panel No results found for: CHOL, TRIG, HDL, CHOLHDL, VLDL, LDLCALC, LDLDIRECT  Additional studies/ records that were reviewed today include:  CPAP downlaod    ASSESSMENT:    1. Obstructive sleep apnea   2. Essential hypertension   3. Class 1 obesity due to excess calories with serious comorbidity in adult, unspecified BMI   4. Aortic valve insufficiency, etiology of cardiac valve disease unspecified      PLAN:  In order of problems listed above:  OSA - the patient is tolerating PAP therapy well without any problems. The PAP download was reviewed today and showed an AHI of 3.3/hr on 10 cm H2O with 100% compliance in using more than 4 hours nightly.  The patient has been using and benefiting from CPAP use and will continue to benefit from therapy.  HTN - BP controlled on current meds.  He will continue on amlodipein/ACE I. Obesity - I have encouraged him to get into a routine exercise program and cut back on carbs and portions.  Mild AR by echo with heart murmur. Repeat echo in 1 year.     Medication Adjustments/Labs and Tests Ordered: Current medicines are reviewed at length with the patient today.  Concerns regarding medicines are outlined above.  Medication changes, Labs and Tests ordered today are listed in the Patient Instructions below.  There are no Patient Instructions on file for this visit.   Signed, Armanda Magic, MD    07/27/2016 1:26 PM    [facility] [address] [place], [place], [place]  [address] Phone: [phone]; Fax: [phone]

## 2016-10-12 ENCOUNTER — Encounter: Payer: Self-pay | Admitting: Neurology

## 2016-10-12 ENCOUNTER — Ambulatory Visit (INDEPENDENT_AMBULATORY_CARE_PROVIDER_SITE_OTHER): Payer: Medicare Other | Admitting: Neurology

## 2016-10-12 VITALS — BP 131/79 | HR 70 | Ht 69.0 in | Wt 236.5 lb

## 2016-10-12 DIAGNOSIS — G7000 Myasthenia gravis without (acute) exacerbation: Secondary | ICD-10-CM | POA: Diagnosis not present

## 2016-10-12 MED ORDER — PREDNISONE 5 MG PO TABS: ORAL_TABLET | ORAL | 3 refills | Status: DC

## 2016-10-12 MED ORDER — PREDNISONE 2.5 MG PO TABS: 2.5000 mg | ORAL_TABLET | Freq: Every day | ORAL | 1 refills | Status: DC

## 2016-10-12 NOTE — Progress Notes (Signed)
Reason for visit: Myasthenia gravis  Edward [name] is an 71 y.o. male  History of present illness:  Edward [name] is a 71 year old right-handed white male with a history of ocular myasthenia gravis. The patient recently had a deep venous thrombosis in the legs, he is on anticoagulant medication currently. The patient is currently on 15 mg daily of prednisone, he has done well so far with the prednisone taper without increased symptoms. The patient is not having any double vision or ptosis or difficulty with chewing or swallowing. He reports generalized fatigue that is worse in the hot weather. The patient is on Mestinon taking 30 mg 3 times daily. He is not having significant nausea on the medication at this time. He returns for an evaluation.  Past Medical History:  Diagnosis Date  . Allergic rhinitis   . Aortic insufficiency 07/27/2016   By echo  . Chronic kidney disease    kidney stones  . Colon polyps   . Diplopia 10/15/2015  . Diverticulosis   . Erectile dysfunction   . GERD (gastroesophageal reflux disease)    with Barrett's esophagus  . Gout   . Hearing loss    hearing aides  . Heart murmur   . HTN (hypertension)   . Hypercholesteremia   . Migraine headache   . Myasthenia gravis (HCC)   . Obesity   . OSA (obstructive sleep apnea)    AHI 43/hr now on CPAP at 10cm H2O    Past Surgical History:  Procedure Laterality Date  . laser assisted uvuloplasty      Family History  Problem Relation Age of Onset  . Hypertension Mother   . Heart attack Father   . Heart disease Father   . Hypertension Sister   . Heart disease Brother   . Hypertension Brother   . Hypertension Brother   . Anemia Brother   . Cancer Brother     Social history:  reports that he has never smoked. He has never used smokeless tobacco. He reports that he drinks alcohol. He reports that he does not use drugs.   No Known Allergies  Medications:  Prior to Admission medications   Medication Sig Start  Date End Date Taking? Authorizing Provider  allopurinol (ZYLOPRIM) 300 MG tablet Take 300 mg by mouth daily.   Yes [provider]  amLODipine-benazepril (LOTREL) 5-10 MG per capsule Take 1 capsule by mouth daily.   Yes [provider]  Biotin 1000 MCG tablet Take 2,500 mcg by mouth daily.    Yes [provider]  cetirizine (ZYRTEC) 10 MG tablet Take 10 mg by mouth daily.   Yes [provider]  diclofenac (VOLTAREN) 75 MG EC tablet Take 1 tablet (75 mg total) by mouth 2 (two) times daily. 11/27/13  Yes Regal, Kirstie PeriNorman S, DPM  fish oil-omega-3 fatty acids 1000 MG capsule Take 1 g by mouth 2 (two) times daily.    Yes [provider]  indomethacin (INDOCIN) 50 MG capsule Take 50 mg by mouth daily as needed (pain).    Yes [provider]  Multiple Vitamin (MULTIVITAMIN) capsule Take 1 capsule by mouth daily.   Yes [provider]  omeprazole (PRILOSEC) 20 MG capsule Take 20 mg by mouth daily.   Yes [provider]  pravastatin (PRAVACHOL) 40 MG tablet Take 40 mg by mouth daily. 07/24/15  Yes [provider]  predniSONE (DELTASONE) 10 MG tablet Take 1 tablet (10 mg total) by mouth daily with breakfast. 06/14/16  Yes York Spaniel, MD  predniSONE (DELTASONE) 5 MG tablet 1.5 tablet daily for 2 months, then take 1 tablet daily Patient taking differently: Take 5 mg by mouth daily. 1.5 tablet daily for 2 months, then take 1 tablet daily 06/14/16  Yes York Spaniel, MD  pyridostigmine (MESTINON) 60 MG tablet Take 30 mg by mouth 3 (three) times daily.   Yes [provider]  rivaroxaban (XARELTO) 20 MG TABS tablet Take 20 mg by mouth daily with supper.   Yes [provider]    ROS:  Out of a complete 14 system review of symptoms, the patient complains only of the following symptoms, and all other reviewed systems are negative.  Fatigue  Blood pressure 131/79, pulse 70, height 5\' 9"  (1.753 m), weight 236 lb  8 oz (107.3 kg).  Physical Exam  General: The patient is alert and cooperative at the time of the examination. The patient is moderately obese.  Skin: No significant peripheral edema is noted.   Neurologic Exam  Mental status: The patient is alert and oriented x 3 at the time of the examination. The patient has apparent normal recent and remote memory, with an apparently normal attention span and concentration ability.   Cranial nerves: Facial symmetry is present. Speech is normal, no aphasia or dysarthria is noted. Extraocular movements are full. Visual fields are full. With superior gaze for 1 minute, no subjective double vision is noted, there is no ptosis or divergence of gaze noted.  Motor: The patient has good strength in all 4 extremities. With arms outstretched 1 minute, no fatigable weakness of the deltoid muscles was noted.  Sensory examination: Soft touch sensation is symmetric on the face, arms, and legs.  Coordination: The patient has good finger-nose-finger and heel-to-shin bilaterally.   Gait and station: The patient has a normal gait. Tandem gait is unsteady. Romberg is negative. No drift is seen.  Reflexes: Deep tendon reflexes are symmetric, but are depressed.   Assessment/Plan:  1. Myasthenia gravis with ocular features  The patient is stable on his current dose of prednisone at 15 mg, we will reduce the dose to 12.5 mg daily. A prescription was sent in for the 2.5 mg tablets. He will follow-up in about 5 months. He will continue the Mestinon taking 30 mg 3 times daily.  Marlan Palau MD 10/12/2016 12:35 PM  [facility] [address] [place], [place] [address]  Phone [phone] Fax [phone]

## 2016-10-12 NOTE — Patient Instructions (Signed)
   We will cut back to 12.5 mg daily of the prednisone.

## 2016-12-27 ENCOUNTER — Telehealth: Payer: Self-pay | Admitting: Neurology

## 2016-12-27 MED ORDER — PREDNISONE 5 MG PO TABS: ORAL_TABLET | ORAL | 3 refills | Status: DC

## 2016-12-27 NOTE — Telephone Encounter (Signed)
I called the patient. He is having increased eye symptoms on the 12.5 mg dose of prednisone with his myasthenia. He has also had increased fatigue, heat intolerance.  We will go back up to 15 mg of prednisone daily.

## 2016-12-27 NOTE — Telephone Encounter (Signed)
Pt said he is having problems with eyes-twitching, burn, unclear since he has been taking 12.5 prednisone. Pt feels it should be increased to 15mg . Please call to discuss

## 2017-02-08 ENCOUNTER — Other Ambulatory Visit: Payer: Self-pay | Admitting: Neurology

## 2017-03-25 ENCOUNTER — Telehealth: Payer: Self-pay | Admitting: Internal Medicine

## 2017-03-25 ENCOUNTER — Encounter: Payer: Self-pay | Admitting: Internal Medicine

## 2017-03-25 NOTE — Telephone Encounter (Signed)
Appt has been scheduled for the pt to see Dr. Arbutus PedMohamed on 12/13 at 1130am . Appt was given to the pt's wife. Address verified. Letter mailed.

## 2017-03-27 ENCOUNTER — Other Ambulatory Visit: Payer: Self-pay | Admitting: Neurology

## 2017-03-28 IMAGING — CT CT CHEST W/ CM
2 of 3 series · 15 of 36 positions shown, 18 images · IV contrast (iopamidol)
Comparison: None.

CLINICAL DATA: Myasthenia gravis.  Evaluate for thymoma.

EXAM:
CT CHEST WITH CONTRAST
TECHNIQUE: Multidetector CT imaging of the chest was performed during
intravenous contrast administration.
CONTRAST:  80mL LONDQ8-3II IOPAMIDOL (LONDQ8-3II) INJECTION 61%

[Series 2: axial st · axial · 0.70mm/px · z∈[-361,-61]mm · 12 of 176 slices shown, 15 images]
[im 13/176  mediastinal]
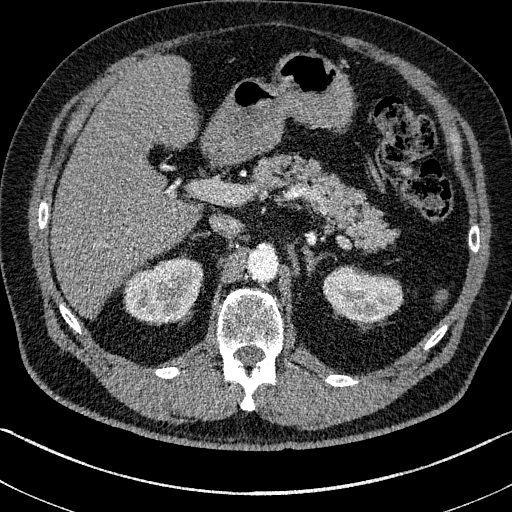
[im 13/176  lung]
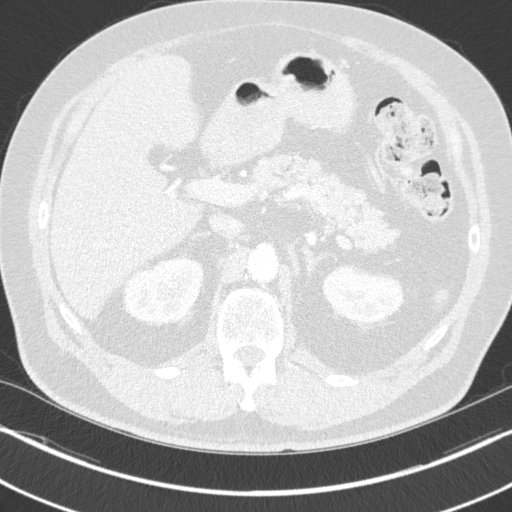
[im 26/176  lung]
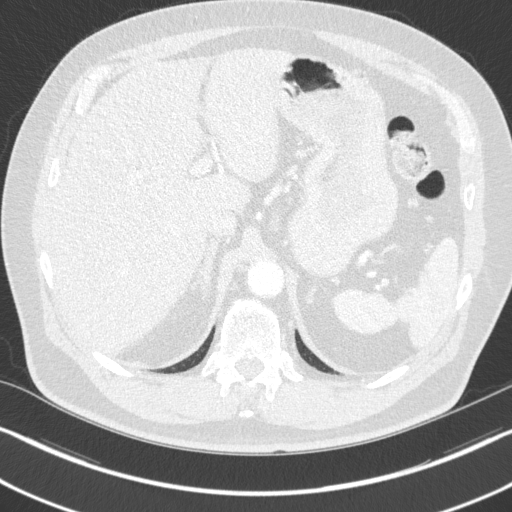
[im 39/176  lung]
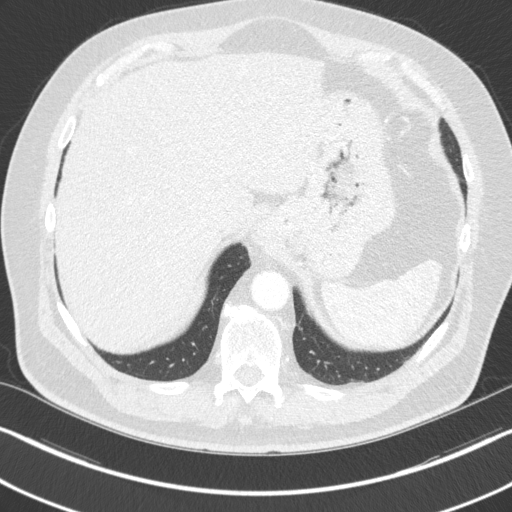
[im 52/176  lung]
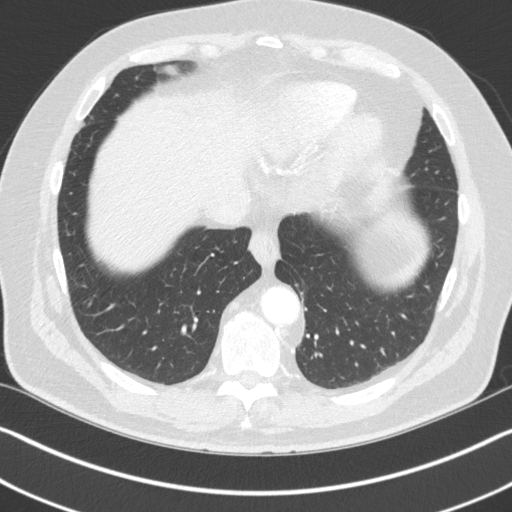
[im 65/176  mediastinal]
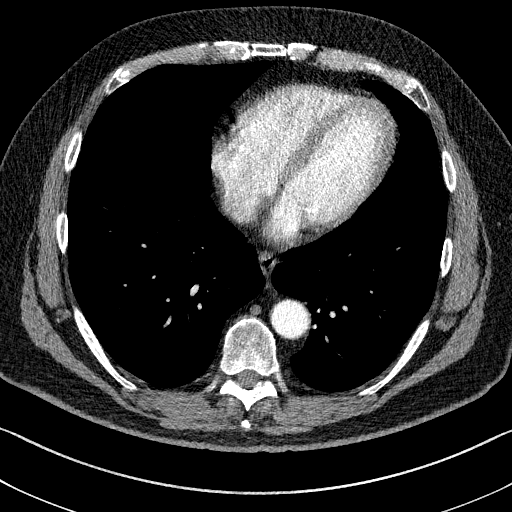
[im 65/176  lung]
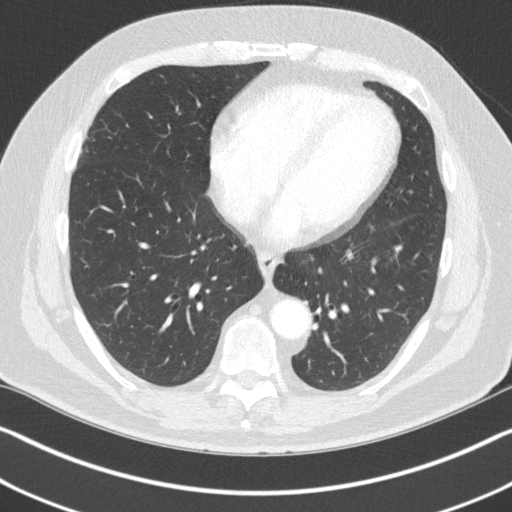
[im 78/176  lung]
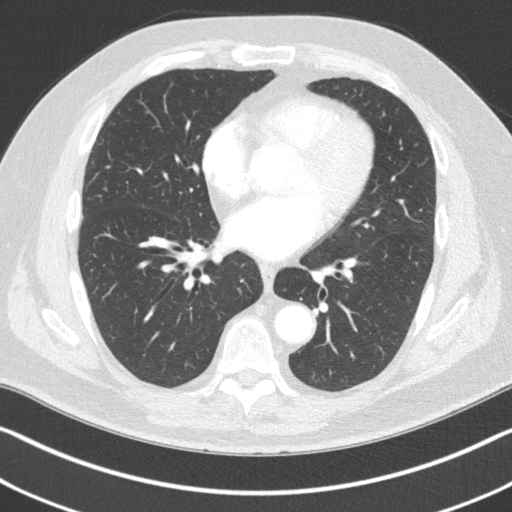
[im 98/176  lung]
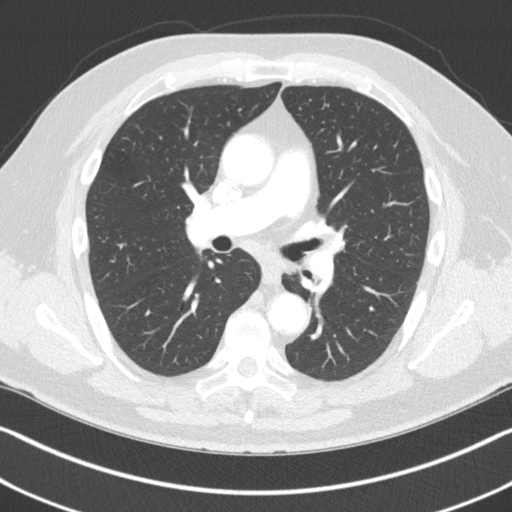
[im 111/176  lung]
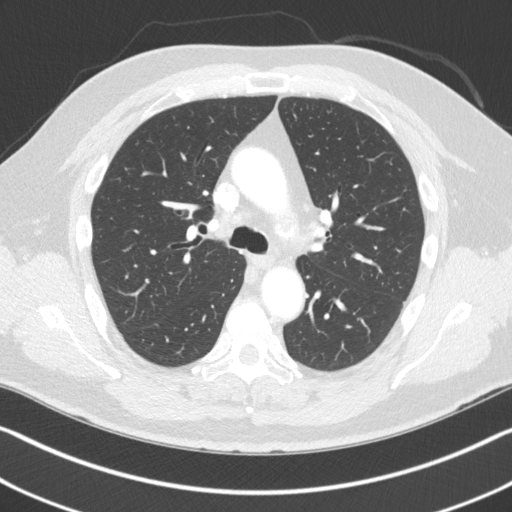
[im 124/176  mediastinal]
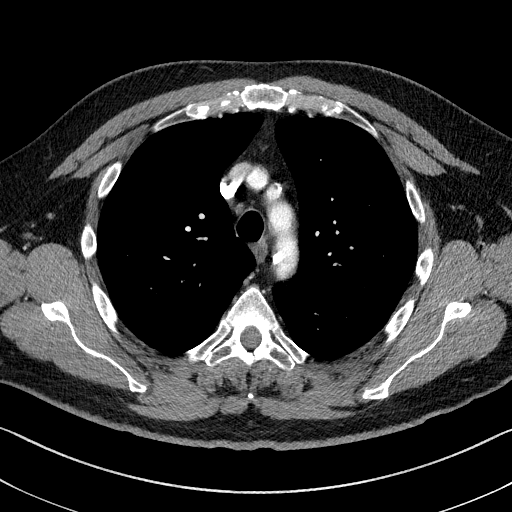
[im 124/176  lung]
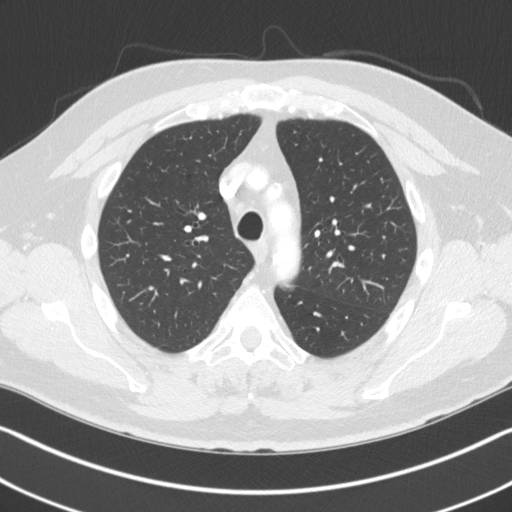
[im 137/176  lung]
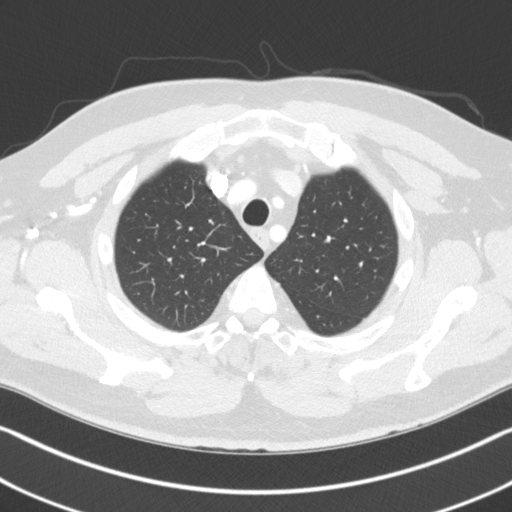
[im 150/176  lung]
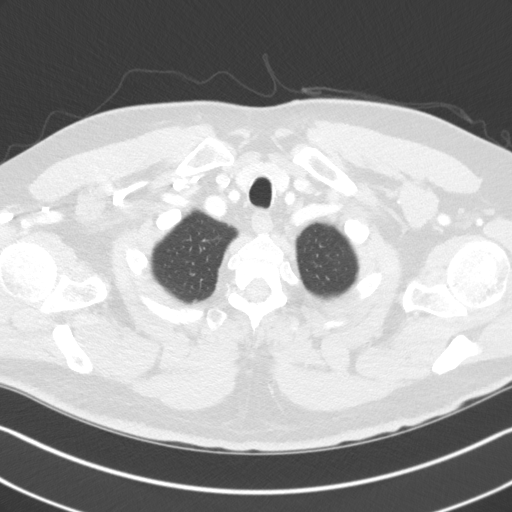
[im 163/176  lung]
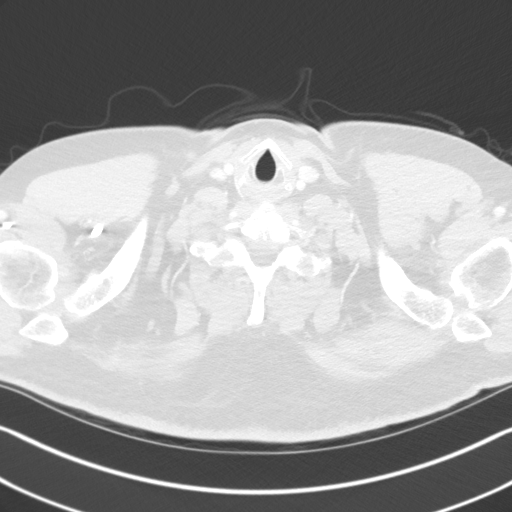

[Series 5: coronal · coronal · 0.69mm/px · 3 of 160 slices shown]
[im 32/160  lung]
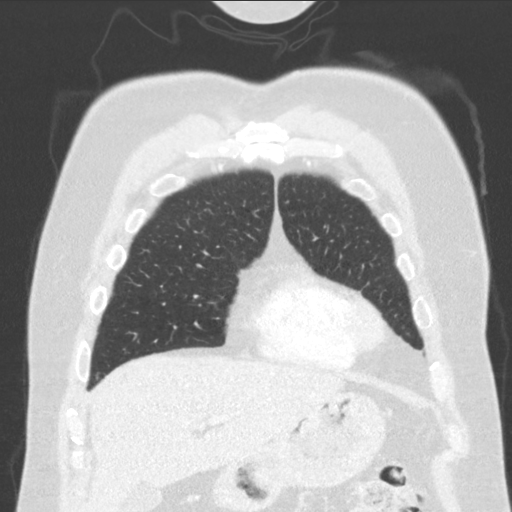
[im 64/160  lung]
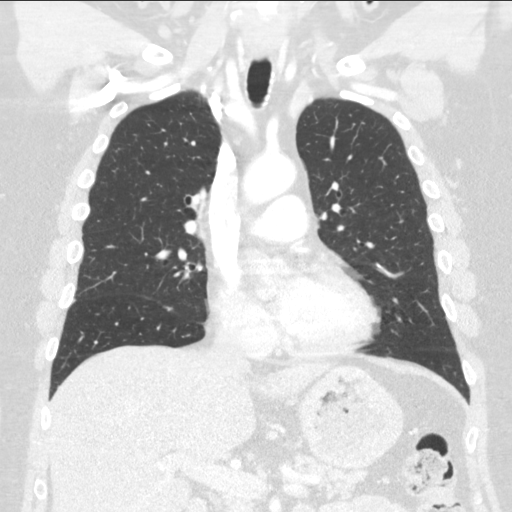
[im 96/160  lung]
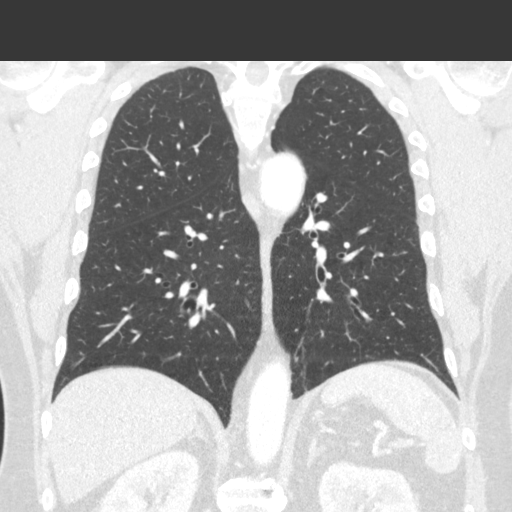

[15 of 36 positions shown; findings below may reference images not displayed]

FINDINGS: No pneumothorax or pleural effusion is noted. No acute pulmonary
disease is noted. Atherosclerosis of thoracic aorta is noted without
aneurysm or dissection. Great vessels are widely patent without
significant stenosis. No mediastinal mass or adenopathy is noted.
Coronary artery calcifications are noted. Visualized portion of
upper abdomen is unremarkable. No significant osseous abnormality is
noted.
IMPRESSION: Coronary artery calcifications suggesting Coronary artery disease.

Thoracic aortic atherosclerosis is noted without aneurysm or
dissection.

No evidence of mediastinal mass or adenopathy. No evidence of
thymoma.

## 2017-03-31 ENCOUNTER — Encounter: Payer: Self-pay | Admitting: Neurology

## 2017-03-31 ENCOUNTER — Other Ambulatory Visit: Payer: Self-pay | Admitting: Neurology

## 2017-03-31 NOTE — Telephone Encounter (Signed)
Pt has appt 05-02-17. Do you want to refill now?

## 2017-04-02 ENCOUNTER — Other Ambulatory Visit: Payer: Self-pay | Admitting: Neurology

## 2017-04-02 MED ORDER — PREDNISONE 5 MG PO TABS: ORAL_TABLET | ORAL | 3 refills | Status: DC

## 2017-04-02 MED ORDER — PREDNISONE 2.5 MG PO TABS: 2.5000 mg | ORAL_TABLET | Freq: Every day | ORAL | 1 refills | Status: DC

## 2017-04-06 ENCOUNTER — Telehealth: Payer: Self-pay | Admitting: Internal Medicine

## 2017-04-06 ENCOUNTER — Ambulatory Visit: Payer: Medicare Other

## 2017-04-06 ENCOUNTER — Ambulatory Visit (HOSPITAL_BASED_OUTPATIENT_CLINIC_OR_DEPARTMENT_OTHER): Payer: Medicare Other | Admitting: Internal Medicine

## 2017-04-06 ENCOUNTER — Encounter: Payer: Self-pay | Admitting: Internal Medicine

## 2017-04-06 VITALS — BP 114/57 | HR 78 | Temp 97.6°F | Resp 20 | Wt 244.4 lb

## 2017-04-06 DIAGNOSIS — N189 Chronic kidney disease, unspecified: Secondary | ICD-10-CM | POA: Diagnosis not present

## 2017-04-06 DIAGNOSIS — I82431 Acute embolism and thrombosis of right popliteal vein: Secondary | ICD-10-CM

## 2017-04-06 DIAGNOSIS — I10 Essential (primary) hypertension: Secondary | ICD-10-CM | POA: Diagnosis not present

## 2017-04-06 NOTE — Telephone Encounter (Signed)
Gave avs and calendar for January 2019 °

## 2017-04-06 NOTE — Progress Notes (Signed)
[facility] [phone]   [phone]  CONSULT NOTE  REFERRING PHYSICIAN: Dr. Shaune Pollack  REASON FOR CONSULTATION:  71 years old white male with history of deep venous thrombosis.  HPI Edward [name] is a 71 y.o. male with past medical history significant for chronic kidney disease, hypertension, dyslipidemia, myasthenia gravis, migraine headache, obstructive sleep apnea, GERD as well as history of colon polyps.  The patient mentioned that in early March 2018 he noticed swelling and pain in his right lower extremities for around 2 weeks.  He was evaluated by his primary care physician at that time and he underwent ultrasound Doppler of the right lower extremity on 06/27/2016 and it showed acute to subacute deep venous thrombosis extending from the popliteal vein into the common femoral vein.  The common femoral and femoral venous thrombus was not occlusive while the popliteal venous thrombus was occlusive.  The patient was a started on treatment with Xarelto since that time.  He has been tolerating this treatment well with no concerning complaints.  He denied having any bleeding issues but sometimes he have cramps unclear to be related to his Xarelto or Lipitor.  He denied having any other complaints.  He does not want to continue Xarelto for life and he is here for evaluation and recommendation regarding the duration of treatment.  He has a family history of deep venous thrombosis in his brother who has been on long-term treatment with Coumadin. The patient denied having any chest pain, shortness breath, cough or hemoptysis.  He has no weight loss or night sweats.  He has no nausea, vomiting, diarrhea or constipation.  He denied having any fever or chills. Family history significant for father with heart disease, mother had hypertension, brother had lung cancer and another brother had recurrent deep venous thrombosis. The patient is married and has 2 children.  He  was accompanied today by his wife Edward [name].  He is a retired history Runner, broadcasting/film/video.  He has no history for smoking, alcohol or drug abuse.  HPI  Past Medical History:  Diagnosis Date  . Allergic rhinitis   . Aortic insufficiency 07/27/2016   By echo  . Chronic kidney disease    kidney stones  . Colon polyps   . Diplopia 10/15/2015  . Diverticulosis   . Erectile dysfunction   . GERD (gastroesophageal reflux disease)    with Barrett's esophagus  . Gout   . Hearing loss    hearing aides  . Heart murmur   . HTN (hypertension)   . Hypercholesteremia   . Migraine headache   . Myasthenia gravis (HCC)   . Obesity   . OSA (obstructive sleep apnea)    AHI 43/hr now on CPAP at 10cm H2O    Past Surgical History:  Procedure Laterality Date  . laser assisted uvuloplasty      Family History  Problem Relation Age of Onset  . Hypertension Mother   . Heart attack Father   . Heart disease Father   . Hypertension Sister   . Heart disease Brother   . Hypertension Brother   . Hypertension Brother   . Anemia Brother   . Cancer Brother     Social History Social History   Tobacco Use  . Smoking status: Never Smoker  . Smokeless tobacco: Never Used  Substance Use Topics  . Alcohol use: Yes    Alcohol/week: 0.0 oz    Comment: rare wine  . Drug use: No  No Known Allergies  Current Outpatient Medications  Medication Sig Dispense Refill  . allopurinol (ZYLOPRIM) 300 MG tablet Take 300 mg by mouth daily.    Marland Kitchen. amLODipine-benazepril (LOTREL) 5-10 MG per capsule Take 1 capsule by mouth daily.    Marland Kitchen. atorvastatin (LIPITOR) 20 MG tablet Take 20 mg by mouth daily.  0  . Biotin 1000 MCG tablet Take 2,500 mcg by mouth daily.     . cetirizine (ZYRTEC) 10 MG tablet Take 10 mg by mouth daily.    . diclofenac (VOLTAREN) 75 MG EC tablet Take 1 tablet (75 mg total) by mouth 2 (two) times daily. 50 tablet 2  . fish oil-omega-3 fatty acids 1000 MG capsule Take 1 g by mouth 2 (two) times daily.     .  indomethacin (INDOCIN) 50 MG capsule Take 50 mg by mouth daily as needed (pain).     . Multiple Vitamin (MULTIVITAMIN) capsule Take 1 capsule by mouth daily.    Marland Kitchen. omeprazole (PRILOSEC) 20 MG capsule Take 20 mg by mouth daily.    . predniSONE (DELTASONE) 2.5 MG tablet Take 1 tablet (2.5 mg total) by mouth daily with breakfast. 90 tablet 1  . predniSONE (DELTASONE) 5 MG tablet 2 tablets daily 100 tablet 3  . pyridostigmine (MESTINON) 60 MG tablet TAKE 1/2 TABLET 3 TIMES DAILY 50 tablet 5  . rivaroxaban (XARELTO) 20 MG TABS tablet Take 20 mg by mouth daily with supper.     No current facility-administered medications for this visit.     Review of Systems  Constitutional: negative Eyes: negative Ears, nose, mouth, throat, and face: negative Respiratory: negative Cardiovascular: negative Gastrointestinal: negative Genitourinary:negative Integument/breast: negative Hematologic/lymphatic: negative Musculoskeletal:positive for myalgias Neurological: negative Behavioral/Psych: negative Endocrine: negative Allergic/Immunologic: negative  Physical Exam  EAV:WUJWJRAL:alert, healthy, no distress, well nourished and well developed SKIN: skin color, texture, turgor are normal, no rashes or significant lesions HEAD: Normocephalic, No masses, lesions, tenderness or abnormalities EYES: normal, PERRLA, Conjunctiva are pink and non-injected EARS: External ears normal, Canals clear OROPHARYNX:no exudate, no erythema and lips, buccal mucosa, and tongue normal  NECK: supple, no adenopathy, no JVD LYMPH:  no palpable lymphadenopathy, no hepatosplenomegaly LUNGS: clear to auscultation , and palpation HEART: regular rate & rhythm, no murmurs and no gallops ABDOMEN:abdomen soft, non-tender, obese, normal bowel sounds and no masses or organomegaly BACK: Back symmetric, no curvature., No CVA tenderness EXTREMITIES:no joint deformities, effusion, or inflammation, no edema, no skin discoloration  NEURO: alert &  oriented x 3 with fluent speech, no focal motor/sensory deficits  PERFORMANCE STATUS: ECOG 1  LABORATORY DATA: Lab Results  Component Value Date   WBC 7.7 10/15/2015   HGB 14.5 10/15/2015   HCT 43.9 10/15/2015   MCV 88 10/15/2015   PLT 214 10/15/2015      Chemistry      Component Value Date/Time   NA 141 10/15/2015 1019   K 5.2 10/15/2015 1019   CL 102 10/15/2015 1019   CO2 21 10/15/2015 1019   BUN 30 (H) 10/15/2015 1019   CREATININE 1.16 10/15/2015 1019      Component Value Date/Time   CALCIUM 9.7 10/15/2015 1019   ALKPHOS 54 10/15/2015 1019   AST 25 10/15/2015 1019   ALT 21 10/15/2015 1019   BILITOT 0.3 10/15/2015 1019       RADIOGRAPHIC STUDIES: No results found.  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  The patient is here today for evaluation and discussion of the duration of treatment.  PLAN: I had a lengthy discussion with the patient and his wife today about his condition.  I explained to the patient that because of his family history of deep venous thrombosis, I will order hypercoagulable panel to rule out any genetic or acquired abnormalities to require long-term treatment. If the patient has hypercoagulable abnormality, he can discontinue his treatment with Xarelto at this point since he received treatment for more than 6 months. I will see the patient back for follow-up visit in 2 weeks for reevaluation and discussion of his lab results and further recommendation regarding his condition. The patient was advised to call immediately if he has any concerning symptoms in the interval. The patient voices understanding of current disease status and treatment options and is in agreement with the current care plan.  All questions were answered. The patient knows to call the clinic with any  problems, questions or concerns. We can certainly see the patient much sooner if necessary.  Thank you so much for allowing me to participate in the care of Edward [name]. I will continue to follow up the patient with you and assist in his care.  I spent 40 minutes counseling the patient face to face. The total time spent in the appointment was 60 minutes.  Disclaimer: This note was dictated with voice recognition software. Similar sounding words can inadvertently be transcribed and may not be corrected upon review.   Lajuana Matte April 06, 2017, 11:57 AM

## 2017-04-08 LAB — BETA-2-GLYCOPROTEIN I ABS, IGG/M/A
Beta-2 Glyco 1 IgA: <9 GPI IgA units (ref 0–25)
Beta-2 Glyco 1 IgM: <9 GPI IgM units (ref 0–32)
Beta-2 Glycoprotein I Ab, IgG: <9 GPI IgG units (ref 0–20)

## 2017-04-08 LAB — LUPUS ANTICOAGULANT PANEL
DRVVT MIX: 90.6 s — AB (ref 0.0–47.0)
DRVVT: 149.1 s — AB (ref 0.0–47.0)
PTT-LA: 42.3 s (ref 0.0–51.9)
dRVVT Confirm: 2 ratio — ABNORMAL HIGH (ref 0.8–1.2)

## 2017-04-08 LAB — PROTEIN S, TOTAL: Protein S, Total: 95 % (ref 60–150)

## 2017-04-08 LAB — PROTEIN C ACTIVITY: PROTEIN C ACTIVITY: 121 % (ref 73–180)

## 2017-04-08 LAB — PROTEIN S ACTIVITY: Protein S-Functional: 158 % — ABNORMAL HIGH (ref 63–140)

## 2017-04-08 LAB — ANTITHROMBIN III: Antithrombin Activity: 135 % (ref 75–135)

## 2017-04-09 LAB — PROTEIN C, TOTAL: PROTEIN C ANTIGEN: 91 % (ref 60–150)

## 2017-04-10 LAB — FACTOR 5 LEIDEN

## 2017-04-10 LAB — PROTHROMBIN GENE MUTATION

## 2017-04-12 LAB — CARDIOLIPIN ANTIBODIES, IGG, IGM, IGA
ANTICARDIOLIPIN IGM: 9 [MPL'U]/mL (ref 0–12)
Anticardiolipin Ab,IgG,Qn: <9 GPL U/mL (ref 0–14)

## 2017-05-01 ENCOUNTER — Encounter: Payer: Self-pay | Admitting: Internal Medicine

## 2017-05-01 ENCOUNTER — Telehealth: Payer: Self-pay | Admitting: Internal Medicine

## 2017-05-01 ENCOUNTER — Inpatient Hospital Stay: Payer: Medicare Other | Attending: Internal Medicine | Admitting: Internal Medicine

## 2017-05-01 VITALS — BP 123/59 | HR 72 | Temp 98.2°F | Resp 18 | Ht 69.0 in | Wt 243.1 lb

## 2017-05-01 DIAGNOSIS — Z7901 Long term (current) use of anticoagulants: Secondary | ICD-10-CM | POA: Diagnosis not present

## 2017-05-01 DIAGNOSIS — I824Z1 Acute embolism and thrombosis of unspecified deep veins of right distal lower extremity: Secondary | ICD-10-CM | POA: Insufficient documentation

## 2017-05-01 DIAGNOSIS — I82419 Acute embolism and thrombosis of unspecified femoral vein: Secondary | ICD-10-CM

## 2017-05-01 NOTE — Telephone Encounter (Signed)
Scheduled appt per 1/7 los - Gave patient AVS and calender per los.  

## 2017-05-01 NOTE — Progress Notes (Signed)
[facility] [phone]   [phone]  OFFICE PROGRESS NOTE  Shaune PollackGates, Donna, MD [address] [place] [address]  DIAGNOSIS: Deep venous thrombosis of the right lower extremity diagnosed in March 2018.  PRIOR THERAPY: None  CURRENT THERAPY: Xarelto 20 mg p.o. daily.  INTERVAL HISTORY: Edward [name] 72 y.o. male returns to the clinic today for follow-up visit accompanied by his wife.  The patient is feeling fine today with no specific complaints.  He denied having any current chest pain, shortness breath, cough or hemoptysis.  He has no fever or chills.  He has no nausea, vomiting, diarrhea or constipation.  He denied having any bleeding, bruises or ecchymosis.  He had a hypercoagulable panel performed recently and he is here for evaluation and discussion of his lab results.  MEDICAL HISTORY: Past Medical History:  Diagnosis Date  . Allergic rhinitis   . Aortic insufficiency 07/27/2016   By echo  . Chronic kidney disease    kidney stones  . Colon polyps   . Diplopia 10/15/2015  . Diverticulosis   . Erectile dysfunction   . GERD (gastroesophageal reflux disease)    with Barrett's esophagus  . Gout   . Hearing loss    hearing aides  . Heart murmur   . HTN (hypertension)   . Hypercholesteremia   . Migraine headache   . Myasthenia gravis (HCC)   . Obesity   . OSA (obstructive sleep apnea)    AHI 43/hr now on CPAP at 10cm H2O    ALLERGIES:  has No Known Allergies.  MEDICATIONS:  Current Outpatient Medications  Medication Sig Dispense Refill  . allopurinol (ZYLOPRIM) 300 MG tablet Take 300 mg by mouth daily.    Marland Kitchen. amLODipine-benazepril (LOTREL) 5-10 MG per capsule Take 1 capsule by mouth daily.    Marland Kitchen. atorvastatin (LIPITOR) 20 MG tablet Take 20 mg by mouth daily.  0  . Biotin 1000 MCG tablet Take 2,500 mcg by mouth daily.     . cetirizine (ZYRTEC) 10 MG tablet Take 10 mg by mouth daily.    . diclofenac (VOLTAREN) 75  MG EC tablet Take 1 tablet (75 mg total) by mouth 2 (two) times daily. 50 tablet 2  . fish oil-omega-3 fatty acids 1000 MG capsule Take 1 g by mouth 2 (two) times daily.     . indomethacin (INDOCIN) 50 MG capsule Take 50 mg by mouth daily as needed (pain).     . Multiple Vitamin (MULTIVITAMIN) capsule Take 1 capsule by mouth daily.    Marland Kitchen. omeprazole (PRILOSEC) 20 MG capsule Take 20 mg by mouth daily.    . predniSONE (DELTASONE) 2.5 MG tablet Take 1 tablet (2.5 mg total) by mouth daily with breakfast. 90 tablet 1  . predniSONE (DELTASONE) 5 MG tablet 2 tablets daily 100 tablet 3  . pyridostigmine (MESTINON) 60 MG tablet TAKE 1/2 TABLET 3 TIMES DAILY 50 tablet 5  . rivaroxaban (XARELTO) 20 MG TABS tablet Take 20 mg by mouth daily with supper.     No current facility-administered medications for this visit.     SURGICAL HISTORY:  Past Surgical History:  Procedure Laterality Date  . laser assisted uvuloplasty      REVIEW OF SYSTEMS:  A comprehensive review of systems was negative.   PHYSICAL EXAMINATION: General appearance: alert, cooperative and no distress Head: Normocephalic, without obvious abnormality, atraumatic Neck: no adenopathy, no JVD, supple, symmetrical, trachea midline and thyroid not enlarged,  symmetric, no tenderness/mass/nodules Lymph nodes: Cervical, supraclavicular, and axillary nodes normal. Resp: clear to auscultation bilaterally Back: symmetric, no curvature. ROM normal. No CVA tenderness. Cardio: regular rate and rhythm, S1, S2 normal, no murmur, click, rub or gallop GI: soft, non-tender; bowel sounds normal; no masses,  no organomegaly Extremities: extremities normal, atraumatic, no cyanosis or edema  ECOG PERFORMANCE STATUS: 0 - Asymptomatic  Blood pressure (!) 123/59, pulse 72, temperature 98.2 F (36.8 C), temperature source Oral, resp. rate 18, height 5\' 9"  (1.753 m), weight 243 lb 1.6 oz (110.3 kg), SpO2 96 %.  LABORATORY DATA: Lab Results  Component Value  Date   WBC 7.7 10/15/2015   HGB 14.5 10/15/2015   HCT 43.9 10/15/2015   MCV 88 10/15/2015   PLT 214 10/15/2015      Chemistry      Component Value Date/Time   NA 141 10/15/2015 1019   K 5.2 10/15/2015 1019   CL 102 10/15/2015 1019   CO2 21 10/15/2015 1019   BUN 30 (H) 10/15/2015 1019   CREATININE 1.16 10/15/2015 1019      Component Value Date/Time   CALCIUM 9.7 10/15/2015 1019   ALKPHOS 54 10/15/2015 1019   AST 25 10/15/2015 1019   ALT 21 10/15/2015 1019   BILITOT 0.3 10/15/2015 1019       RADIOGRAPHIC STUDIES: No results found.  ASSESSMENT AND PLAN: This is a very pleasant 72 years old white male with history of right lower extremity deep venous thrombosis diagnosed in March 2018 and currently on treatment with anticoagulation with Xarelto 20 mg p.o. daily and has been tolerating this treatment fairly well. His hypercoagulable panel showed no concerning abnormality except for suspicious lupus anticoagulant but this is not completely confirmed until the patient had repeat blood test again and I would consider doing it after completing 1 year off treatment with Xarelto.  He is expected to complete the 1 year of treatment with Xarelto in the middle of March 2019. I will arrange for him to come back for follow-up visit in 3 months for evaluation with repeat lupus anticoagulant.  If the upcoming test confirms lupus anticoagulant, I would resume his treatment with Xarelto or other anticoagulation for life. The patient was advised to call immediately if he has any concerning symptoms in the interval. The patient voices understanding of current disease status and treatment options and is in agreement with the current care plan.  All questions were answered. The patient knows to call the clinic with any problems, questions or concerns. We can certainly see the patient much sooner if necessary.  I spent 10 minutes counseling the patient face to face. The total time spent in the  appointment was 15 minutes.  Disclaimer: This note was dictated with voice recognition software. Similar sounding words can inadvertently be transcribed and may not be corrected upon review.

## 2017-05-02 ENCOUNTER — Ambulatory Visit (INDEPENDENT_AMBULATORY_CARE_PROVIDER_SITE_OTHER): Payer: Medicare Other | Admitting: Neurology

## 2017-05-02 ENCOUNTER — Encounter: Payer: Self-pay | Admitting: Neurology

## 2017-05-02 VITALS — BP 114/74 | HR 69 | Ht 69.0 in | Wt 246.0 lb

## 2017-05-02 DIAGNOSIS — G7000 Myasthenia gravis without (acute) exacerbation: Secondary | ICD-10-CM

## 2017-05-02 NOTE — Progress Notes (Signed)
Reason for visit: Myasthenia gravis  Edward [name] is an 72 y.o. male  History of present illness:  Edward [name] is a 72 year old right-handed white male with a history of myasthenia gravis primarily with ocular features.  The patient has done relatively well with the cooler months of the year, in the summer he has more symptoms.  He has been able to drop his prednisone dose back to 12.5 mg daily and do well over the last 1 month.  The patient occasionally may have some sensation that he has difficulty swallowing, this is not a persistent problem.  The patient reports no weakness of the extremities.  He is tolerating the medications fairly well.  He returns for an evaluation.  Past Medical History:  Diagnosis Date  . Allergic rhinitis   . Aortic insufficiency 07/27/2016   By echo  . Chronic kidney disease    kidney stones  . Colon polyps   . Diplopia 10/15/2015  . Diverticulosis   . Erectile dysfunction   . GERD (gastroesophageal reflux disease)    with Barrett's esophagus  . Gout   . Hearing loss    hearing aides  . Heart murmur   . HTN (hypertension)   . Hypercholesteremia   . Migraine headache   . Myasthenia gravis (HCC)   . Obesity   . OSA (obstructive sleep apnea)    AHI 43/hr now on CPAP at 10cm H2O    Past Surgical History:  Procedure Laterality Date  . laser assisted uvuloplasty      Family History  Problem Relation Age of Onset  . Hypertension Mother   . Heart attack Father   . Heart disease Father   . Hypertension Sister   . Heart disease Brother   . Hypertension Brother   . Hypertension Brother   . Anemia Brother   . Cancer Brother     Social history:  reports that  has never smoked. he has never used smokeless tobacco. He reports that he drinks alcohol. He reports that he does not use drugs.   No Known Allergies  Medications:  Prior to Admission medications   Medication Sig Start Date End Date Taking? Authorizing Provider  allopurinol (ZYLOPRIM)  300 MG tablet Take 300 mg by mouth daily.   Yes [provider]  amLODipine-benazepril (LOTREL) 5-10 MG per capsule Take 1 capsule by mouth daily.   Yes [provider]  atorvastatin (LIPITOR) 20 MG tablet Take 20 mg by mouth daily. 03/26/17  Yes [provider]  Biotin 1000 MCG tablet Take 2,500 mcg by mouth daily.    Yes [provider]  cetirizine (ZYRTEC) 10 MG tablet Take 10 mg by mouth daily.   Yes [provider]  diclofenac (VOLTAREN) 75 MG EC tablet Take 1 tablet (75 mg total) by mouth 2 (two) times daily. 11/27/13  Yes Regal, Kirstie Peri, DPM  fish oil-omega-3 fatty acids 1000 MG capsule Take 1 g by mouth 2 (two) times daily.    Yes [provider]  indomethacin (INDOCIN) 50 MG capsule Take 50 mg by mouth daily as needed (pain).    Yes [provider]  Multiple Vitamin (MULTIVITAMIN) capsule Take 1 capsule by mouth daily.   Yes [provider]  omeprazole (PRILOSEC) 20 MG capsule Take 20 mg by mouth daily.   Yes [provider]  predniSONE (DELTASONE) 2.5 MG tablet Take 1 tablet (2.5 mg total) by mouth daily with breakfast. 04/02/17  Yes York Spaniel, MD  predniSONE (DELTASONE) 5 MG tablet 2 tablets daily 04/02/17  Yes York SpanielWillis, Skylynne Schlechter K, MD  pyridostigmine (MESTINON) 60 MG tablet TAKE 1/2 TABLET 3 TIMES DAILY 02/08/17  Yes York SpanielWillis, Mercy Malena K, MD  rivaroxaban (XARELTO) 20 MG TABS tablet Take 20 mg by mouth daily with supper.   Yes [provider]    ROS:  Out of a complete 14 system review of symptoms, the patient complains only of the following symptoms, and all other reviewed systems are negative.  Excessive sweating Hearing loss Eye itching Heat intolerance Sleep apnea, snoring Muscle cramps  Blood pressure 114/74, pulse 69, height 5\' 9"  (1.753 m), weight 246 lb (111.6 kg).  Physical Exam  General: The patient is alert and cooperative at the time of the examination.  The patient is  moderately obese.  Skin: No significant peripheral edema is noted.   Neurologic Exam  Mental status: The patient is alert and oriented x 3 at the time of the examination. The patient has apparent normal recent and remote memory, with an apparently normal attention span and concentration ability.   Cranial nerves: Facial symmetry is present. Speech is normal, no aphasia or dysarthria is noted. Extraocular movements are full. Visual fields are full.  With superior gaze for 1 minute, no ptosis is seen, the patient reports some mild double vision after 45 seconds.  Motor: The patient has good strength in all 4 extremities.  With arms outstretched 1 minute, no fatigable weakness of the deltoid muscles was noted.  Sensory examination: Soft touch sensation is symmetric on the face, arms, and legs.  Coordination: The patient has good finger-nose-finger and heel-to-shin bilaterally.  Gait and station: The patient has a normal gait. Tandem gait is slightly unsteady. Romberg is negative. No drift is seen.  Reflexes: Deep tendon reflexes are symmetric.   Assessment/Plan:  1.  Myasthenia gravis with ocular features  The patient will continue the Mestinon and prednisone, after 2 months he is to call our office if he is doing well, we may try to cut back to 10 mg of prednisone daily.  The patient will otherwise will follow-up in 6 months.  Edward [name]. Keith Caterine Mcmeans MD 05/02/2017 10:38 AM  [facility] [address] [place] [id number]  Phone [phone] Fax [phone]

## 2017-06-05 ENCOUNTER — Other Ambulatory Visit: Payer: Self-pay | Admitting: *Deleted

## 2017-06-05 MED ORDER — PYRIDOSTIGMINE BROMIDE 60 MG PO TABS: ORAL_TABLET | ORAL | 1 refills | Status: DC

## 2017-06-21 ENCOUNTER — Other Ambulatory Visit: Payer: Self-pay | Admitting: Neurology

## 2017-06-21 MED ORDER — PREDNISONE 5 MG PO TABS: ORAL_TABLET | ORAL | 2 refills | Status: DC

## 2017-07-27 ENCOUNTER — Ambulatory Visit (HOSPITAL_COMMUNITY): Payer: Medicare Other | Attending: Cardiology

## 2017-07-27 ENCOUNTER — Other Ambulatory Visit: Payer: Self-pay

## 2017-07-27 DIAGNOSIS — I351 Nonrheumatic aortic (valve) insufficiency: Secondary | ICD-10-CM | POA: Diagnosis not present

## 2017-07-27 DIAGNOSIS — G4733 Obstructive sleep apnea (adult) (pediatric): Secondary | ICD-10-CM | POA: Diagnosis present

## 2017-07-27 DIAGNOSIS — E6609 Other obesity due to excess calories: Secondary | ICD-10-CM | POA: Diagnosis present

## 2017-07-27 DIAGNOSIS — I10 Essential (primary) hypertension: Secondary | ICD-10-CM | POA: Diagnosis not present

## 2017-07-27 DIAGNOSIS — I083 Combined rheumatic disorders of mitral, aortic and tricuspid valves: Secondary | ICD-10-CM | POA: Diagnosis not present

## 2017-07-29 ENCOUNTER — Encounter: Payer: Self-pay | Admitting: Cardiology

## 2017-07-29 ENCOUNTER — Encounter: Payer: Self-pay | Admitting: Internal Medicine

## 2017-07-31 ENCOUNTER — Encounter (HOSPITAL_COMMUNITY): Payer: Self-pay | Admitting: Emergency Medicine

## 2017-07-31 ENCOUNTER — Telehealth: Payer: Self-pay | Admitting: Medical Oncology

## 2017-07-31 ENCOUNTER — Other Ambulatory Visit: Payer: Self-pay

## 2017-07-31 ENCOUNTER — Emergency Department (HOSPITAL_COMMUNITY)
Admission: EM | Admit: 2017-07-31 | Discharge: 2017-08-01 | Disposition: A | Discharge status: Home / Self Care | Payer: Medicare Other | Attending: Emergency Medicine | Admitting: Emergency Medicine

## 2017-07-31 ENCOUNTER — Telehealth: Payer: Self-pay

## 2017-07-31 DIAGNOSIS — I10 Essential (primary) hypertension: Secondary | ICD-10-CM | POA: Diagnosis not present

## 2017-07-31 DIAGNOSIS — Z86718 Personal history of other venous thrombosis and embolism: Secondary | ICD-10-CM | POA: Insufficient documentation

## 2017-07-31 DIAGNOSIS — M7989 Other specified soft tissue disorders: Secondary | ICD-10-CM

## 2017-07-31 DIAGNOSIS — R2241 Localized swelling, mass and lump, right lower limb: Secondary | ICD-10-CM | POA: Insufficient documentation

## 2017-07-31 DIAGNOSIS — M79605 Pain in left leg: Secondary | ICD-10-CM | POA: Diagnosis present

## 2017-07-31 DIAGNOSIS — Z79899 Other long term (current) drug therapy: Secondary | ICD-10-CM | POA: Diagnosis not present

## 2017-07-31 DIAGNOSIS — I350 Nonrheumatic aortic (valve) stenosis: Secondary | ICD-10-CM

## 2017-07-31 NOTE — Telephone Encounter (Signed)
Pt.notified

## 2017-07-31 NOTE — Telephone Encounter (Signed)
Notes recorded by Phineas Semenobertson, Quanetta Truss, RN on 07/31/2017 at 12:23 PM EDT Patient and spouse made aware of echo results. Patient verbalized understanding and thankful for the call. Echo ordered to be scheduled in a year.  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

## 2017-07-31 NOTE — Telephone Encounter (Signed)
He may need repeat doppler to r/o DVT. Can be done by his PCP.

## 2017-07-31 NOTE — Telephone Encounter (Signed)
For 2 days - swelling and pain beind R knee where he had a DVT before  -  denies redness or warmth .Stopped  xarelto two weeks, in preparation for Lupus anticoag test. Please advise.

## 2017-07-31 NOTE — ED Triage Notes (Signed)
Pt c/o right leg pain and calf swelling x 2 days. Hx blood clot, stopped xarelto per MD order last week. Denies chest pain/shortness of breath.

## 2017-08-01 ENCOUNTER — Telehealth: Payer: Self-pay

## 2017-08-01 ENCOUNTER — Ambulatory Visit (HOSPITAL_BASED_OUTPATIENT_CLINIC_OR_DEPARTMENT_OTHER)
Admission: RE | Admit: 2017-08-01 | Discharge: 2017-08-01 | Disposition: A | Discharge status: Home / Self Care | Payer: Medicare Other | Source: Ambulatory Visit | Attending: Emergency Medicine | Admitting: Emergency Medicine

## 2017-08-01 DIAGNOSIS — I82441 Acute embolism and thrombosis of right tibial vein: Secondary | ICD-10-CM | POA: Insufficient documentation

## 2017-08-01 DIAGNOSIS — R2241 Localized swelling, mass and lump, right lower limb: Secondary | ICD-10-CM | POA: Diagnosis not present

## 2017-08-01 DIAGNOSIS — M7989 Other specified soft tissue disorders: Secondary | ICD-10-CM

## 2017-08-01 MED ORDER — RIVAROXABAN (XARELTO) EDUCATION KIT FOR DVT/PE PATIENTS
PACK | Freq: Once | Status: AC
  Administered 2017-08-01: 02:00:00
  Filled 2017-08-01: qty 1

## 2017-08-01 MED ORDER — RIVAROXABAN 15 MG PO TABS
15.0000 mg | ORAL_TABLET | Freq: Once | ORAL | Status: AC
  Administered 2017-08-01: 15 mg via ORAL
  Filled 2017-08-01: qty 1

## 2017-08-01 MED ORDER — RIVAROXABAN (XARELTO) VTE STARTER PACK (15 & 20 MG): ORAL_TABLET | ORAL | 0 refills | Status: DC

## 2017-08-01 NOTE — Telephone Encounter (Signed)
Returned pt call regarding positive scan for DVT. Patient will be starting back on xarelto dose pack prescribed by ED physician. Asked if colonoscopy should be rescheduled. Informed them to ask the pcp when they see her tomorrow but most likely will need to be because they will want him off blood thinners and it should not be stopped right now due to positive DVT.  Minerva Endsiffany N Alastair Hennes RN

## 2017-08-01 NOTE — ED Provider Notes (Signed)
[facility] EMERGENCY DEPARTMENT Provider Note   CSN: [id number] Arrival date & time: 07/31/17  [place]     History   Chief Complaint Chief Complaint  Patient presents with  . Leg Pain    HPI Edward [name] is a 72 y.o. male.  72 year old male with history of DVT, gout, reflux, myasthenia gravis presents to the emergency department for evaluation of left lower extremity discomfort and swelling.  He began to notice discomfort behind his right knee 2 days ago similar to when he was diagnosed with a blood clot in March 2018.  He was on Xarelto for 1 year, but stopped this medication 2 weeks ago.  He has further noted some swelling in his right lower extremity which is worse when upright.  It slightly improves with elevation.  He has not had any reported claudication, significant redness, or heat to the leg.  No fevers, chest pain, shortness of breath.  He was advised to come to the emergency department by his primary care doctor.     Past Medical History:  Diagnosis Date  . Allergic rhinitis   . 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 Chronic kidney disease    kidney stones  . Colon polyps   . Diplopia 10/15/2015  . Diverticulosis   . Erectile dysfunction   . GERD (gastroesophageal reflux disease)    with Barrett's esophagus  . Gout   . Hearing loss    hearing aides  . HTN (hypertension)   . Hypercholesteremia   . Migraine headache   . Myasthenia gravis ([place])   . Obesity   . OSA (obstructive sleep apnea)    AHI 43/hr now on CPAP at 10cm H2O    Patient Active Problem List   Diagnosis Date Noted  . Aortic insufficiency 07/27/2016  . Myasthenia gravis ([place]) 01/19/2016  . Diplopia 10/15/2015  . Heart murmur 08/26/2015  . Nephrolithiasis 02/10/2013  . Migraine headache 02/10/2013  . Diverticulosis 02/10/2013  . GERD (gastroesophageal reflux disease) 02/10/2013  . Colonic polyp  02/10/2013  . Obesity   . Gout   . HYPERLIPIDEMIA 06/21/2008  . Essential hypertension 06/21/2008  . ALLERGIC RHINITIS 06/21/2008  . Obstructive sleep apnea 06/04/2008    Past Surgical History:  Procedure Laterality Date  . laser assisted uvuloplasty          Home Medications    Prior to Admission medications   Medication Sig Start Date End Date Taking? Authorizing Provider  allopurinol (ZYLOPRIM) 300 MG tablet Take 300 mg by mouth daily.    [provider]  amLODipine-benazepril (LOTREL) 5-10 MG per capsule Take 1 capsule by mouth daily.    [provider]  atorvastatin (LIPITOR) 20 MG tablet Take 20 mg by mouth daily. 03/26/17   [provider]  Biotin 1000 MCG tablet Take 2,500 mcg by mouth daily.     [provider]  cetirizine (ZYRTEC) 10 MG tablet Take 10 mg by mouth daily.    [provider]  diclofenac (VOLTAREN) 75 MG EC tablet Take 1 tablet (75 mg total) by mouth 2 (two) times daily. 11/27/13   Wallene Huh, DPM  fish oil-omega-3 fatty acids 1000 MG capsule Take 1 g by mouth 2 (two) times daily.     [provider]  indomethacin (INDOCIN) 50 MG capsule Take 50 mg by mouth daily as needed (pain).     [provider]  Multiple Vitamin (MULTIVITAMIN) capsule Take 1 capsule by mouth daily.    [provider]  omeprazole (PRILOSEC) 20 MG capsule Take 20 mg by mouth daily.    [provider]  predniSONE (DELTASONE) 2.5 MG tablet Take 1 tablet (2.5 mg total) by mouth daily with breakfast. 04/02/17   Kathrynn Ducking, MD  predniSONE (DELTASONE) 5 MG tablet 2 tablets daily 06/21/17   Kathrynn Ducking, MD  pyridostigmine (MESTINON) 60 MG tablet TAKE 1/2 TABLET 3 TIMES DAILY 06/05/17   Kathrynn Ducking, MD  Rivaroxaban 15 & 20 MG TBPK Take as directed on package: Start with one 73m tablet by mouth twice a day with food. On Day 22, switch to one 251mtablet once a day with food. 08/01/17   HuAntonietta Breach PA-C    Family History Family History  Problem Relation Age of Onset  . Hypertension Mother   . Heart attack Father   . Heart disease Father   . Hypertension Sister   . Heart disease Brother   . Hypertension Brother   . Hypertension Brother   . Anemia Brother   . Cancer Brother     Social History Social History   Tobacco Use  . Smoking status: Never Smoker  . Smokeless tobacco: Never Used  Substance Use Topics  . Alcohol use: Yes    Alcohol/week: 0.0 oz    Comment: rare wine  . Drug use: No     Allergies   Patient has no known allergies.   Review of Systems Review of Systems Ten systems reviewed and are negative for acute change, except as noted in the HPI.    Physical Exam Updated Vital Signs BP (!) 159/94 (BP Location: Left Arm)   Pulse 80   Temp 98.2 F (36.8 C) (Oral)   Resp 14   Ht 5' 9" (1.753 m)   Wt 107.5 kg (237 lb)   SpO2 96%   BMI 35.00 kg/m   Physical Exam  Constitutional: He is oriented to person, place, and time. He appears well-developed and well-nourished. No distress.  Nontoxic appearing and in NAD  HENT:  Head: Normocephalic and atraumatic.  Eyes: Conjunctivae and EOM are normal. No scleral icterus.  Neck: Normal range of motion.  Cardiovascular: Normal rate, regular rhythm and intact distal pulses.  DP pulse 2+ in the RLE  Pulmonary/Chest: Effort normal. No stridor. No respiratory distress.  Respirations even and unlabored  Musculoskeletal: Normal range of motion. He exhibits edema.  TTP behind the R knee with trace to 1+ pitting edema in the RLE. Normal ROM of BLE.  Neurological: He is alert and oriented to person, place, and time. He exhibits normal muscle tone. Coordination normal.  Skin: Skin is warm and dry. No rash noted. He is not diaphoretic. No erythema. No pallor.  Psychiatric: He has a normal mood and affect. His behavior is normal.  Nursing note and vitals reviewed.    ED Treatments / Results  Labs (all labs  ordered are listed, but only abnormal results are displayed) Labs Reviewed - No data to display  EKG None  Radiology No results found.  Procedures Procedures (including critical care time)  Medications Ordered in ED Medications  Rivaroxaban (XARELTO) tablet 15 mg (has no administration in time range)  rivaroxaban (XARELTO) Education Kit for DVT/PE patients (has no administration in time range)     Initial Impression / Assessment and Plan / ED Course  I have reviewed the triage vital signs and the nursing  notes.  Pertinent labs & imaging results that were available during my care of the patient were reviewed by me and considered in my medical decision making (see chart for details).     72 year old male presents to the emergency department for evaluation of right lower extremity swelling.  He is concerned about a blood clot in his right leg as he has history of this and discontinued his blood thinner 2 weeks ago after 1 year of treatment.  Patient neurovascularly intact.  No concern for cellulitis.  No palpable cords on exam, though there is discomfort when palpating behind the right knee.  Normal range of motion of the extremity appreciated.  Patient has been scheduled for a venous duplex for later this morning.  I have apologized about our inability to complete this test at this time.  Patient verbalizes understanding.  He has been instructed to keep follow-up with his primary care doctor in 2 days.  Will provide prescription for Xarelto should imaging later this morning be positive for subsequent DVT.  Return precautions discussed and provided. Patient discharged in stable condition with no unaddressed concerns.   Final Clinical Impressions(s) / ED Diagnoses   Final diagnoses:  Right leg swelling    ED Discharge Orders        Ordered    Rivaroxaban 15 & 20 MG TBPK     08/01/17 0113    LE VENOUS     08/01/17 0113       Antonietta Breach, PA-C 08/01/17 0155    Merryl Hacker, MD 08/01/17 [phone]

## 2017-08-01 NOTE — ED Notes (Signed)
ED Provider at bedside. 

## 2017-08-01 NOTE — Progress Notes (Signed)
RLE venous duplex prelim: Appears to be age-indeterminate DVT in the distal femoral and popliteal vein. More acute appearing DVT noted in the mid PTV which is dilated. Farrel DemarkJill Eunice, RDMS, RVT  Patient has prescription in hand for rivaroxaban from ED visit last night. Was instructed by ED physician to fill if positive today. Patient will fill prescription today and follow up with PCP.   Patient has prescription in hand from ED visit last night for Rivaroxaban

## 2017-08-01 NOTE — Discharge Instructions (Addendum)
Return to have your ultrasound completed in the morning. You have been prescribed a Xarelto Dosepak to start if your ultrasound in the morning is positive for a DVT.  If your ultrasound is negative for a DVT, continue follow-up with Dr. Shirline FreesMohammed and your primary care doctor as scheduled and remain off Xarelto.  You may take Tylenol as needed for discomfort.  Continue to elevate your legs to limit swelling.  You may return for new or concerning symptoms.

## 2017-08-01 NOTE — ED Notes (Signed)
Pt verbalizes understanding of d/c instructions. Pt received prescriptions. Pt ambulatory at d/c with all belongings.  

## 2017-08-04 ENCOUNTER — Telehealth: Payer: Self-pay | Admitting: Internal Medicine

## 2017-08-04 NOTE — Telephone Encounter (Signed)
Patient called to cancel °

## 2017-08-15 ENCOUNTER — Other Ambulatory Visit: Payer: Medicare Other

## 2017-08-22 ENCOUNTER — Ambulatory Visit: Payer: Medicare Other | Admitting: Internal Medicine

## 2017-09-03 ENCOUNTER — Other Ambulatory Visit: Payer: Self-pay | Admitting: Neurology

## 2017-10-31 ENCOUNTER — Ambulatory Visit: Payer: Medicare Other | Admitting: Neurology

## 2017-11-16 ENCOUNTER — Encounter: Payer: Self-pay | Admitting: Neurology

## 2017-11-16 ENCOUNTER — Ambulatory Visit (INDEPENDENT_AMBULATORY_CARE_PROVIDER_SITE_OTHER): Payer: Medicare Other | Admitting: Neurology

## 2017-11-16 VITALS — BP 118/60 | HR 82 | Ht 69.0 in | Wt 240.5 lb

## 2017-11-16 DIAGNOSIS — G7000 Myasthenia gravis without (acute) exacerbation: Secondary | ICD-10-CM | POA: Diagnosis not present

## 2017-11-16 MED ORDER — PYRIDOSTIGMINE BROMIDE 60 MG PO TABS: ORAL_TABLET | ORAL | 1 refills | Status: DC

## 2017-11-16 NOTE — Progress Notes (Signed)
Reason for visit: Myasthenia gravis  Edward [name] is an 72 y.o. male  History of present illness:  Edward [name] is a 72 year old right-handed white male with a history of myasthenia gravis primarily with ocular features.  The patient has done fairly well since last seen.  He was to drop his prednisone dose to 10 mg daily when he was seen last, but this never occurred, he remains on 12.5 mg daily.  He occasionally will have some double vision, he indicates that oftentimes he will use an ice pack on the eye which improves the double vision.  Denies any severe problems with ptosis.  He does have some sweats at times with the Mestinon.  The patient has gone back on Xarelto for a DVT of the right leg several months ago.  Otherwise, no new medical issues have come up.  The patient tries to exercise on a regular basis.  Past Medical History:  Diagnosis Date  . Allergic rhinitis   . 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 Chronic kidney disease    kidney stones  . Colon polyps   . Diplopia 10/15/2015  . Diverticulosis   . Erectile dysfunction   . GERD (gastroesophageal reflux disease)    with Barrett's esophagus  . Gout   . Hearing loss    hearing aides  . HTN (hypertension)   . Hypercholesteremia   . Migraine headache   . Myasthenia gravis (HCC)   . Obesity   . OSA (obstructive sleep apnea)    AHI 43/hr now on CPAP at 10cm H2O    Past Surgical History:  Procedure Laterality Date  . laser assisted uvuloplasty      Family History  Problem Relation Age of Onset  . Hypertension Mother   . Heart attack Father   . Heart disease Father   . Hypertension Sister   . Heart disease Brother   . Hypertension Brother   . Hypertension Brother   . Anemia Brother   . Cancer Brother     Social history:  reports that he has never smoked. He has never used smokeless tobacco. He reports that he drinks alcohol. He reports  that he does not use drugs.   No Known Allergies  Medications:  Prior to Admission medications   Medication Sig Start Date End Date Taking? Authorizing Provider  allopurinol (ZYLOPRIM) 300 MG tablet Take 300 mg by mouth daily.   Yes [provider]  amLODipine-benazepril (LOTREL) 5-10 MG per capsule Take 1 capsule by mouth daily.   Yes [provider]  atorvastatin (LIPITOR) 20 MG tablet Take 20 mg by mouth daily. 03/26/17  Yes [provider]  Biotin 1000 MCG tablet Take 2,500 mcg by mouth daily.    Yes [provider]  cetirizine (ZYRTEC) 10 MG tablet Take 10 mg by mouth daily.   Yes [provider]  diclofenac (VOLTAREN) 75 MG EC tablet Take 1 tablet (75 mg total) by mouth 2 (two) times daily. 11/27/13  Yes Regal, Kirstie PeriNorman S, DPM  fish oil-omega-3 fatty acids 1000 MG capsule Take 1 g by mouth 2 (two) times daily.    Yes [provider]  indomethacin (INDOCIN) 50 MG capsule Take 50 mg by mouth daily as needed (pain).    Yes [provider]  Multiple Vitamin (MULTIVITAMIN) capsule Take 1 capsule by mouth daily.   Yes [provider]  omeprazole (PRILOSEC)  20 MG capsule Take 20 mg by mouth daily.   Yes [provider]  predniSONE (DELTASONE) 2.5 MG tablet TAKE 1 TABLET (2.5 MG TOTAL) BY MOUTH DAILY WITH BREAKFAST. 09/04/17  Yes York Spaniel, MD  predniSONE (DELTASONE) 5 MG tablet 2 tablets daily 06/21/17  Yes York Spaniel, MD  pyridostigmine (MESTINON) 60 MG tablet TAKE 1/2 TABLET 3 TIMES DAILY 06/05/17  Yes York Spaniel, MD  Rivaroxaban 15 & 20 MG TBPK Take as directed on package: Start with one 15mg  tablet by mouth twice a day with food. On Day 22, switch to one 20mg  tablet once a day with food. 08/01/17  Yes Antony Madura, PA-C    ROS:  Out of a complete 14 system review of symptoms, the patient complains only of the following symptoms, and all other reviewed systems are negative.  Fatigue  Blood  pressure 118/60, pulse 82, height 5\' 9"  (1.753 m), weight 240 lb 8 oz (109.1 kg), SpO2 98 %.  Physical Exam  General: The patient is alert and cooperative at the time of the examination.  The patient is markedly obese.  Skin: 1+ edema at the ankles is seen bilaterally..   Neurologic Exam  Mental status: The patient is alert and oriented x 3 at the time of the examination. The patient has apparent normal recent and remote memory, with an apparently normal attention span and concentration ability.   Cranial nerves: Facial symmetry is present. Speech is normal, no aphasia or dysarthria is noted. Extraocular movements are full. Visual fields are full.  With superior gaze for 1 minute, no ptosis or subjective double vision or divergence of gaze was seen.  Motor: The patient has good strength in all 4 extremities.  With the arms outstretched for 1 minute, no fatigue of the deltoid muscles was noted.  Sensory examination: Soft touch sensation is symmetric on the face, arms, and legs.  Coordination: The patient has good finger-nose-finger and heel-to-shin bilaterally.  Gait and station: The patient has a normal gait. Tandem gait is slightly unsteady. Romberg is negative. No drift is seen.  Reflexes: Deep tendon reflexes are symmetric.   Assessment/Plan:  1.  Myasthenia gravis  The patient will reduce the prednisone to 10 mg daily, if he does well for the next 2-1/2 to 3 months, he will call our office and we will reduce the prednisone further, going down by 1 mg increments.  The patient will remain on Mestinon.  He will follow-up in 6 months.  A prescription for the Mestinon was sent in.  Marlan Palau MD 11/16/2017 9:22 AM  [facility] [address] [place], [place] [address]  Phone [phone] Fax [phone]

## 2017-11-16 NOTE — Patient Instructions (Signed)
   Reduce the prednisone to 10 mg a day. If you do well over the next 3 months, call our office and we will continue the prednisone taper.

## 2018-01-18 ENCOUNTER — Encounter: Payer: Self-pay | Admitting: Neurology

## 2018-01-22 ENCOUNTER — Telehealth: Payer: Self-pay | Admitting: Neurology

## 2018-01-22 NOTE — Telephone Encounter (Signed)
Blood work done by the primary care physician on 18 January 2018 reveals a hemoglobin A1c of 6.4.  Glucose 119, BUN of 23, creatinine 1.24, sodium 139, potassium 4.2 chloride of 104, CO2 27, calcium 9.4, total protein 6.7, albumin 4.2, liver profile is unremarkable.  PSA of 1.12, uric acid level 4.7.

## 2018-02-21 ENCOUNTER — Encounter: Payer: Self-pay | Admitting: Podiatry

## 2018-02-21 ENCOUNTER — Ambulatory Visit (INDEPENDENT_AMBULATORY_CARE_PROVIDER_SITE_OTHER): Payer: Medicare Other

## 2018-02-21 ENCOUNTER — Ambulatory Visit (INDEPENDENT_AMBULATORY_CARE_PROVIDER_SITE_OTHER): Payer: Medicare Other | Admitting: Podiatry

## 2018-02-21 DIAGNOSIS — M25572 Pain in left ankle and joints of left foot: Secondary | ICD-10-CM

## 2018-02-21 DIAGNOSIS — M722 Plantar fascial fibromatosis: Secondary | ICD-10-CM

## 2018-02-21 DIAGNOSIS — R2681 Unsteadiness on feet: Secondary | ICD-10-CM | POA: Diagnosis not present

## 2018-02-21 MED ORDER — TRIAMCINOLONE ACETONIDE 10 MG/ML IJ SUSP
10.0000 mg | Freq: Once | INTRAMUSCULAR | Status: AC
  Administered 2018-02-21: 10 mg

## 2018-02-21 NOTE — Progress Notes (Signed)
Subjective:   Patient ID: Edward [name], male   DOB: 72 y.o.   MRN: [id number]   HPI Patient presents with exquisite discomfort plantar aspect heel bilateral and also feels instability with history of myasthenia gravis and eye issues.  Patient states he has had several falls and does not feel stable with gait.  Patient does not smoke likes to be active   Review of Systems  All other systems reviewed and are negative.       Objective:  Physical Exam  Constitutional: He appears well-developed and well-nourished.  Cardiovascular: Intact distal pulses.  Pulmonary/Chest: Effort normal.  Musculoskeletal: Normal range of motion.  Neurological: He is alert.  Skin: Skin is warm.  Nursing note and vitals reviewed.   Neurovascular status was found to be intact muscle strength adequate with moderate swelling of the left ankle which is been present for a while with reduced motion.  Patient has exquisite discomfort plantar aspect heel region bilateral and moderate instability with gait.  Patient has good digital perfusion well oriented x3     Assessment:  Acute plantar fasciitis bilateral with patient also found to have moderate ankle instability left and generalized gait instability     Plan:  H&P conditions reviewed and will get a focus on the heels today with possibility for balance bracing physical therapy based on other conditions he is described.  Today I injected the plantar fascial bilateral 3 mg Kenalog 5 Milgram Xylocaine and applied fascial brace is bilateral with instructions on usage and patient will be seen back to recheck  X-ray indicates there is small spur formation no indication stress fracture or advanced arthritis

## 2018-02-21 NOTE — Patient Instructions (Addendum)

## 2018-03-04 ENCOUNTER — Other Ambulatory Visit: Payer: Self-pay | Admitting: Neurology

## 2018-03-15 ENCOUNTER — Ambulatory Visit (INDEPENDENT_AMBULATORY_CARE_PROVIDER_SITE_OTHER): Payer: Medicare Other | Admitting: Podiatry

## 2018-03-15 ENCOUNTER — Encounter: Payer: Self-pay | Admitting: Podiatry

## 2018-03-15 DIAGNOSIS — M722 Plantar fascial fibromatosis: Secondary | ICD-10-CM

## 2018-03-16 NOTE — Progress Notes (Signed)
Subjective:   Patient ID: Edward [name], male   DOB: 72 y.o.   MRN: [id number]   HPI Patient states his heels are feeling quite a bit better with discomfort only after being on them for a while or when he first gets up and that it seems to improve   ROS      Objective:  Physical Exam  Neurovascular status intact with diminishment of discomfort plantar heel region bilateral with fluid buildup noted that has gotten better with only pain upon deep palpation     Assessment:  Plantar fasciitis bilateral that improving with mild discomfort still noted     Plan:  H&P discussed condition and recommended continued supportive shoes and stretching exercises.  Patient will be seen back as needed and at this point appears to be making good progress

## 2018-05-22 ENCOUNTER — Ambulatory Visit (INDEPENDENT_AMBULATORY_CARE_PROVIDER_SITE_OTHER): Payer: Medicare Other | Admitting: Neurology

## 2018-05-22 ENCOUNTER — Encounter: Payer: Self-pay | Admitting: Neurology

## 2018-05-22 VITALS — BP 122/70 | HR 95 | Ht 69.0 in | Wt 230.0 lb

## 2018-05-22 DIAGNOSIS — G7000 Myasthenia gravis without (acute) exacerbation: Secondary | ICD-10-CM | POA: Diagnosis not present

## 2018-05-22 MED ORDER — PREDNISONE 5 MG PO TABS: ORAL_TABLET | ORAL | 2 refills | Status: DC

## 2018-05-22 MED ORDER — PREDNISONE 1 MG PO TABS: ORAL_TABLET | ORAL | 2 refills | Status: DC

## 2018-05-22 NOTE — Progress Notes (Signed)
Reason for visit: Myasthenia gravis  Edward [name] is an 73 y.o. male  History of present illness:  [name] is a 73 year old right-handed white male with a history of obesity and a history of myasthenia gravis.  The patient has primarily ocular features of his myasthenia gravis, he has done well since last seen on the 10 mg dose of the prednisone.  He is not on CellCept or Imuran.  He takes Mestinon as well.  Some nights he does not sleep well if he takes his afternoon nap too late in the day.  The patient likes to do woodcarving, if he focuses too long he may have trouble with double vision.  The patient denies any problems with chewing or swallowing.  He has not had any weakness of the arms or legs.  He tries to exercise on a regular basis, he has some history of right hip and leg pain.  Exercise seems to help this.  He returns for an evaluation.  He has been on Xarelto for recurring deep venous thrombosis.  Past Medical History:  Diagnosis Date  . Allergic rhinitis   . 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 Chronic kidney disease    kidney stones  . Colon polyps   . Diplopia 10/15/2015  . Diverticulosis   . Erectile dysfunction   . GERD (gastroesophageal reflux disease)    with Barrett's esophagus  . Gout   . Hearing loss    hearing aides  . HTN (hypertension)   . Hypercholesteremia   . Migraine headache   . Myasthenia gravis (HCC)   . Obesity   . OSA (obstructive sleep apnea)    AHI 43/hr now on CPAP at 10cm H2O    Past Surgical History:  Procedure Laterality Date  . laser assisted uvuloplasty      Family History  Problem Relation Age of Onset  . Hypertension Mother   . Heart attack Father   . Heart disease Father   . Hypertension Sister   . Heart disease Brother   . Hypertension Brother   . Hypertension Brother   . Anemia Brother   . Cancer Brother     Social history:  reports that he  has never smoked. He has never used smokeless tobacco. He reports current alcohol use. He reports that he does not use drugs.   No Known Allergies  Medications:  Prior to Admission medications   Medication Sig Start Date End Date Taking? Authorizing Provider  allopurinol (ZYLOPRIM) 300 MG tablet Take 300 mg by mouth daily.   Yes [provider]  amLODipine-benazepril (LOTREL) 5-10 MG per capsule Take 1 capsule by mouth daily.   Yes [provider]  atorvastatin (LIPITOR) 20 MG tablet Take 20 mg by mouth daily. 03/26/17  Yes [provider]  Biotin 1000 MCG tablet Take 2,500 mcg by mouth daily.    Yes [provider]  cetirizine (ZYRTEC) 10 MG tablet Take 10 mg by mouth daily.   Yes [provider]  diclofenac (VOLTAREN) 75 MG EC tablet Take 1 tablet (75 mg total) by mouth 2 (two) times daily. 11/27/13  Yes Regal, Kirstie PeriNorman S, DPM  fish oil-omega-3 fatty acids 1000 MG capsule Take 1 g by mouth 2 (two) times daily.    Yes [provider]  indomethacin (INDOCIN) 50 MG capsule Take 50 mg by mouth daily as needed (pain).  Yes [provider]  Multiple Vitamin (MULTIVITAMIN) capsule Take 1 capsule by mouth daily.   Yes [provider]  omeprazole (PRILOSEC) 20 MG capsule Take 20 mg by mouth daily.   Yes [provider]  predniSONE (DELTASONE) 5 MG tablet TAKE 2 TABLETS BY MOUTH EVERY DAY 03/05/18  Yes York SpanielWillis, Marinell Igarashi K, MD  pyridostigmine (MESTINON) 60 MG tablet TAKE 1/2 TABLET 3 TIMES DAILY 11/16/17  Yes York SpanielWillis, Cristobal Advani K, MD  Rivaroxaban 15 & 20 MG TBPK Take as directed on package: Start with one 15mg  tablet by mouth twice a day with food. On Day 22, switch to one 20mg  tablet once a day with food. 08/01/17  Yes Antony MaduraHumes, Kelly, PA-C  XARELTO 20 MG TABS tablet  03/10/18  Yes [provider]    ROS:  Out of a complete 14 system review of symptoms, the patient complains only of the following symptoms, and all other  reviewed systems are negative.  Hearing loss Eye itching Sleep apnea, daytime sleepiness Muscle cramps  Blood pressure 122/70, pulse 95, height 5\' 9"  (1.753 m), weight 230 lb (104.3 kg), SpO2 94 %.  Physical Exam  General: The patient is alert and cooperative at the time of the examination.  The patient is moderately to markedly obese.  Skin: No significant peripheral edema is noted.   Neurologic Exam  Mental status: The patient is alert and oriented x 3 at the time of the examination. The patient has apparent normal recent and remote memory, with an apparently normal attention span and concentration ability.   Cranial nerves: Facial symmetry is present. Speech is normal, no aphasia or dysarthria is noted. Extraocular movements are full. Visual fields are full.  With superior deviation eyes for 1 minute, no significant ptosis is seen, the patient reports no double vision.  Motor: The patient has good strength in all 4 extremities.  With the arms outstretched for 1 minute, no fatigable weakness of the deltoid muscles were noted.  Sensory examination: Soft touch sensation is symmetric on the face, arms, and legs.  Coordination: The patient has good finger-nose-finger and heel-to-shin bilaterally.  Gait and station: The patient has a normal gait. Tandem gait is normal. Romberg is negative. No drift is seen.  Reflexes: Deep tendon reflexes are symmetric.   Assessment/Plan:  1.  Myasthenia gravis  The patient may vary his Mestinon dose if he is performing a task that requires visual focusing for long periods of time.  He may go up on the Mestinon dose during these times.  The patient seemed to be otherwise doing fairly well.  We will try to lower the prednisone dose going to 9 mg daily for 2 months, and then take 8 mg daily.  He will contact our office if his symptoms worsen on the lower dose.  A prescription was sent in for the 1 mg prednisone tablets.  Marlan Palau. Keith Courtnei Ruddell  MD 05/22/2018 10:29 AM  [facility] [address] [place], [place] [address]  Phone [phone] Fax [phone]

## 2018-05-22 NOTE — Patient Instructions (Signed)
We will go to 9 mg of the prednisone daily for 2 months then take 8 mg a day.

## 2018-06-04 ENCOUNTER — Other Ambulatory Visit: Payer: Self-pay | Admitting: Neurology

## 2018-11-28 ENCOUNTER — Other Ambulatory Visit: Payer: Self-pay | Admitting: Neurology

## 2018-12-03 ENCOUNTER — Telehealth: Payer: Self-pay | Admitting: Neurology

## 2018-12-03 ENCOUNTER — Other Ambulatory Visit: Payer: Self-pay

## 2018-12-03 ENCOUNTER — Encounter: Payer: Self-pay | Admitting: Neurology

## 2018-12-03 ENCOUNTER — Ambulatory Visit
Admission: RE | Admit: 2018-12-03 | Discharge: 2018-12-03 | Disposition: A | Discharge status: Home / Self Care | Payer: Medicare Other | Source: Ambulatory Visit | Attending: Neurology | Admitting: Neurology

## 2018-12-03 ENCOUNTER — Ambulatory Visit (INDEPENDENT_AMBULATORY_CARE_PROVIDER_SITE_OTHER): Payer: Medicare Other | Admitting: Neurology

## 2018-12-03 VITALS — BP 142/64 | HR 59 | Temp 97.8°F | Ht 69.0 in | Wt 222.0 lb

## 2018-12-03 DIAGNOSIS — G8929 Other chronic pain: Secondary | ICD-10-CM

## 2018-12-03 DIAGNOSIS — M5441 Lumbago with sciatica, right side: Secondary | ICD-10-CM | POA: Diagnosis not present

## 2018-12-03 DIAGNOSIS — M21371 Foot drop, right foot: Secondary | ICD-10-CM

## 2018-12-03 DIAGNOSIS — G7000 Myasthenia gravis without (acute) exacerbation: Secondary | ICD-10-CM

## 2018-12-03 HISTORY — DX: Foot drop, right foot: M21.371

## 2018-12-03 MED ORDER — GABAPENTIN 100 MG PO CAPS: 100.0000 mg | ORAL_CAPSULE | Freq: Three times a day (TID) | ORAL | 3 refills | Status: DC

## 2018-12-03 MED ORDER — PREDNISONE 1 MG PO TABS: ORAL_TABLET | ORAL | 2 refills | Status: DC

## 2018-12-03 MED ORDER — LIDOCAINE 5 % EX PTCH: 1.0000 | MEDICATED_PATCH | CUTANEOUS | 1 refills | Status: DC

## 2018-12-03 MED ORDER — PREDNISONE 5 MG PO TABS: ORAL_TABLET | ORAL | 2 refills | Status: DC

## 2018-12-03 NOTE — Patient Instructions (Signed)
We will reduce the prednisone by one mg every 2 months until you get to 5 mg daily.  We will start gabapentin 100 mg three times a day for the low back pain.

## 2018-12-03 NOTE — Progress Notes (Signed)
Reason for visit: Myasthenia gravis  Edward [name] is an 73 y.o. male  History of present illness:  Edward [name] is a 73 year old right-handed white male with a history of ocular myasthenia gravis.  He has come down to 8 mg daily dose of prednisone and has tolerated this well.  He unfortunately now is having some problems with right hip and right thigh pain and some sensation of weakness in that leg.  He reports numbness in the feet that has been gone for several years.  He does have some gait instability, he may stumble on occasion but he has not fallen.  He has not had any recent evaluation of his low back.  At times, he may have severe cramps in the right thigh medially that may result in severe pain and partial collapse of the leg.  He comes to this office for an evaluation.  He is not having much in the way of double vision or droopy eyelids.  Past Medical History:  Diagnosis Date  . Allergic rhinitis   . 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 Chronic kidney disease    kidney stones  . Colon polyps   . Diplopia 10/15/2015  . Diverticulosis   . Erectile dysfunction   . GERD (gastroesophageal reflux disease)    with Barrett's esophagus  . Gout   . Hearing loss    hearing aides  . HTN (hypertension)   . Hypercholesteremia   . Migraine headache   . Myasthenia gravis ([place])   . Obesity   . OSA (obstructive sleep apnea)    AHI 43/hr now on CPAP at 10cm H2O  . Right foot drop 12/03/2018    Past Surgical History:  Procedure Laterality Date  . laser assisted uvuloplasty      Family History  Problem Relation Age of Onset  . Hypertension Mother   . Heart attack Father   . Heart disease Father   . Hypertension Sister   . Heart disease Brother   . Hypertension Brother   . Hypertension Brother   . Anemia Brother   . Cancer Brother     Social history:  reports that he has never smoked. He has never used  smokeless tobacco. He reports current alcohol use. He reports that he does not use drugs.   No Known Allergies  Medications:  Prior to Admission medications   Medication Sig Start Date End Date Taking? Authorizing Provider  allopurinol (ZYLOPRIM) 300 MG tablet Take 300 mg by mouth daily.   Yes [provider]  amLODipine-benazepril (LOTREL) 5-10 MG per capsule Take 1 capsule by mouth daily.   Yes [provider]  atorvastatin (LIPITOR) 20 MG tablet Take 20 mg by mouth daily. 03/26/17  Yes [provider]  Biotin 1000 MCG tablet Take 2,500 mcg by mouth daily.    Yes [provider]  cetirizine (ZYRTEC) 10 MG tablet Take 10 mg by mouth daily.   Yes [provider]  diclofenac (VOLTAREN) 75 MG EC tablet Take 1 tablet (75 mg total) by mouth 2 (two) times daily. 11/27/13  Yes Regal, Tamala Fothergill, DPM  fish oil-omega-3 fatty acids 1000 MG capsule Take 1 g by mouth 2 (two) times daily.    Yes [provider]  indomethacin (INDOCIN) 50 MG capsule Take 50 mg by mouth daily as needed (pain).    Yes [provider]  Multiple Vitamin (MULTIVITAMIN)  capsule Take 1 capsule by mouth daily.   Yes [provider]  omeprazole (PRILOSEC) 20 MG capsule Take 20 mg by mouth daily.   Yes [provider]  predniSONE (DELTASONE) 1 MG tablet Take 4 tablets daily for 2 months, then take 3 tablets a day 05/22/18  Yes York SpanielWillis, Maquita Sandoval K, MD  predniSONE (DELTASONE) 5 MG tablet TAKE 1 TABLET BY MOUTH EVERY DAY 05/22/18  Yes York SpanielWillis, Louie Flenner K, MD  pyridostigmine (MESTINON) 60 MG tablet TAKE 1/2 TABLET 3 TIMES DAILY 11/29/18  Yes York SpanielWillis, Aleigha Gilani K, MD  Rivaroxaban 15 & 20 MG TBPK Take as directed on package: Start with one 15mg  tablet by mouth twice a day with food. On Day 22, switch to one 20mg  tablet once a day with food. 08/01/17  Yes Antony MaduraHumes, Kelly, PA-C  XARELTO 20 MG TABS tablet  03/10/18  Yes [provider]    ROS:  Out of a complete 14  system review of symptoms, the patient complains only of the following symptoms, and all other reviewed systems are negative.  Back pain, right leg pain Muscle cramps  Blood pressure (!) 142/64, pulse (!) 59, temperature 97.8 F (36.6 C), temperature source Temporal, height 5\' 9"  (1.753 m), weight 222 lb (100.7 kg), SpO2 96 %.  Physical Exam  General: The patient is alert and cooperative at the time of the examination.  The patient is moderately obese.  Neuromuscular: The patient is able to flex the low back to about 110 degrees.  Skin: No significant peripheral edema is noted.   Neurologic Exam  Mental status: The patient is alert and oriented x 3 at the time of the examination. The patient has apparent normal recent and remote memory, with an apparently normal attention span and concentration ability.   Cranial nerves: Facial symmetry is present. Speech is normal, no aphasia or dysarthria is noted. Extraocular movements are full. Visual fields are full.  Motor: The patient has good strength in all 4 extremities, with exception of a right foot drop associated with weakness with eversion of the foot.  Sensory examination: Soft touch sensation is symmetric on the face, arms, and legs.  Coordination: The patient has good finger-nose-finger and heel-to-shin bilaterally.  Gait and station: The patient has a slightly wide-based gait.  Tandem gait is unsteady.  Romberg is negative.  Reflexes: Deep tendon reflexes are symmetric.   Assessment/Plan:  1.  Low back pain, right leg pain  2.  Ocular myasthenia gravis  3.  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  He may have a peripheral neuropathy that is leading to some gait instability.  He is having cramps in the right thigh, he is having some right leg pain.  The patient will be sent for nerve conduction studies on both legs and EMG on the right  leg.  He will have x-ray of the low back.  He will continue the prednisone taper going to 7 mg of prednisone daily for 2 months, 6 mg for 2 months and then go to 5 mg daily.  He will follow-up in 6 months.  Marlan Palau. Keith Renner Sebald MD 12/03/2018 10:41 AM  [facility] [address] [place] [address]  Phone [phone] Fax [phone]

## 2018-12-03 NOTE — Telephone Encounter (Signed)
I called the patient.  X-ray of the lumbar spine shows multilevel arthritic changes and mild retrolisthesis, this could be the source of some of his back and leg pain.  We will try a Lidoderm patch but the insurance may not pay for this, I have indicated that to the patient.  EMG and nerve conduction studies pending.   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  Mild grade 1 retrolisthesis at all lumbar levels.  Abdominal aortic atherosclerosis.

## 2019-01-04 ENCOUNTER — Other Ambulatory Visit: Payer: Self-pay | Admitting: Family Medicine

## 2019-01-04 ENCOUNTER — Other Ambulatory Visit: Payer: Self-pay

## 2019-01-04 ENCOUNTER — Ambulatory Visit (INDEPENDENT_AMBULATORY_CARE_PROVIDER_SITE_OTHER): Payer: Medicare Other

## 2019-01-04 DIAGNOSIS — R05 Cough: Secondary | ICD-10-CM

## 2019-01-04 DIAGNOSIS — R059 Cough, unspecified: Secondary | ICD-10-CM

## 2019-01-14 ENCOUNTER — Encounter: Payer: Self-pay | Admitting: Neurology

## 2019-01-14 ENCOUNTER — Other Ambulatory Visit: Payer: Self-pay

## 2019-01-14 ENCOUNTER — Encounter: Payer: Medicare Other | Admitting: Neurology

## 2019-01-14 ENCOUNTER — Ambulatory Visit (INDEPENDENT_AMBULATORY_CARE_PROVIDER_SITE_OTHER): Payer: Medicare Other | Admitting: Neurology

## 2019-01-14 DIAGNOSIS — G603 Idiopathic progressive neuropathy: Secondary | ICD-10-CM | POA: Diagnosis not present

## 2019-01-14 DIAGNOSIS — G629 Polyneuropathy, unspecified: Secondary | ICD-10-CM

## 2019-01-14 DIAGNOSIS — G8929 Other chronic pain: Secondary | ICD-10-CM

## 2019-01-14 DIAGNOSIS — Z0289 Encounter for other administrative examinations: Secondary | ICD-10-CM

## 2019-01-14 DIAGNOSIS — G7000 Myasthenia gravis without (acute) exacerbation: Secondary | ICD-10-CM

## 2019-01-14 DIAGNOSIS — E538 Deficiency of other specified B group vitamins: Secondary | ICD-10-CM

## 2019-01-14 HISTORY — DX: Polyneuropathy, unspecified: G62.9

## 2019-01-14 NOTE — Procedures (Signed)
     HISTORY:  Edward [name] is a 73 year old gentleman with a history of a right foot drop, he reports some back pain and right-sided leg pain.  The patient is being evaluated for possible neuropathy or a lumbosacral radiculopathy.  NERVE CONDUCTION STUDIES:  Nerve conduction studies were performed on both lower extremities.  The response for the right peroneal nerve was absent, the distal motor latencies for the left peroneal nerve and for the posterior tibial nerves bilaterally were normal but with very low motor amplitudes.  Slowing was seen for the left peroneal nerve and for the posterior tibial nerves bilaterally.  No response was seen for the sural nerves or for the peroneal nerves bilaterally.  The F-wave latencies for the posterior tibial nerves were prolonged bilaterally.  EMG STUDIES:  EMG study was performed on the right lower extremity:  The tibialis anterior muscle reveals 2 to 4K motor units with decreased recruitment. 2+ fibrillations and positive waves were seen. The peroneus tertius muscle reveals 2 to 4K motor units with decreased recruitment. 2+ fibrillations and positive waves were seen. The medial gastrocnemius muscle reveals 1 to 5K motor units with decreased recruitment. 1+ fibrillations and positive waves were seen. The vastus lateralis muscle reveals 2 to 4K motor units with full recruitment. No fibrillations or positive waves were seen. The iliopsoas muscle reveals 2 to 4K motor units with full recruitment. No fibrillations or positive waves were seen. The biceps femoris muscle (long head) reveals 2 to 4K motor units with full recruitment. No fibrillations or positive waves were seen. The lumbosacral paraspinal muscles were tested at 3 levels, and revealed no abnormalities of insertional activity at all 3 levels tested. There was good relaxation.   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  EMG of the right lower extremity shows distal acute and chronic neuropathic signs of denervation consistent with the diagnosis of peripheral neuropathy.  No clear evidence of an overlying lumbosacral radiculopathy was seen.  Jill Alexanders MD 01/14/2019 4:11 PM  [facility] [address] [place],  [address]  Phone [phone] Fax [phone]

## 2019-01-14 NOTE — Progress Notes (Addendum)
Patient comes in for EMG nerve conduction study evaluation.  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  EMG of the right leg shows acute and chronic distal signs of neuropathic denervation consistent with the peripheral neuropathy.  No clear evidence of an overlying lumbosacral radiculopathy was noted.  Blood work will be done today to look for other causes of neuropathy.  The patient does have some low back pain and right leg discomfort.      [place]    Nerve / Sites Muscle Latency Ref. Amplitude Ref. Rel Amp Segments Distance Velocity Ref. Area    ms ms mV mV %  cm m/s m/s mVms  R Peroneal - EDB     Ankle EDB NR ?6.5 NR ?2.0 NR Ankle - EDB 9   NR     Fib head EDB NR  NR  NR Fib head - Ankle 29 NR ?44 NR  L Peroneal - EDB     Ankle EDB 6.3 ?6.5 0.5 ?2.0 100 Ankle - EDB 9   1.7     Fib head EDB 14.9  0.5  95.8 Fib head - Ankle 30 34 ?44 1.5     Pop fossa EDB 17.9  0.5  94.1 Pop fossa - Fib head 10 34 ?44 1.3         Pop fossa - Ankle      R Tibial - AH     Ankle AH 4.4 ?5.8 0.9 ?4.0 100 Ankle - AH 9   3.5     Pop fossa AH 17.1  0.2  21.8 Pop fossa - Ankle 44 35 ?41 1.0  L Tibial - AH     Ankle AH 4.5 ?5.8 1.0 ?4.0 100 Ankle - AH 9   3.2     Pop fossa AH 15.4  0.4  37.4 Pop fossa - Ankle 42 39 ?41 0.9             SNC    Nerve / Sites Rec. Site Peak Lat Ref.  Amp Ref. Segments Distance    ms ms V V  cm  R Sural - Ankle (Calf)     Calf Ankle NR ?4.4 NR ?6 Calf - Ankle 14  L Sural - Ankle (Calf)     Calf Ankle NR ?4.4 NR ?6 Calf - Ankle 14  R Superficial peroneal - Ankle     Lat leg Ankle NR ?4.4 NR ?6 Lat leg - Ankle 14  L Superficial peroneal - Ankle     Lat leg Ankle NR ?4.4 NR ?6 Lat leg - Ankle 14             F  Wave    Nerve F Lat Ref.   ms ms  R Tibial - AH 74.5 ?56.0  L Tibial - AH 60.5 ?56.0

## 2019-01-14 NOTE — Progress Notes (Signed)
Please refer to EMG and nerve conduction procedure note.  

## 2019-01-17 LAB — MULTIPLE MYELOMA PANEL, SERUM
Albumin SerPl Elph-Mcnc: 3.7 g/dL (ref 2.9–4.4)
Albumin/Glob SerPl: 1.5 (ref 0.7–1.7)
Alpha 1: 0.2 g/dL (ref 0.0–0.4)
Alpha2 Glob SerPl Elph-Mcnc: 0.7 g/dL (ref 0.4–1.0)
B-Globulin SerPl Elph-Mcnc: 0.8 g/dL (ref 0.7–1.3)
Gamma Glob SerPl Elph-Mcnc: 0.8 g/dL (ref 0.4–1.8)
Globulin, Total: 2.6 g/dL (ref 2.2–3.9)
IgA/Immunoglobulin A, Serum: 195 mg/dL (ref 61–437)
IgG (Immunoglobin G), Serum: 998 mg/dL (ref 603–1613)
IgM (Immunoglobulin M), Srm: 56 mg/dL (ref 15–143)
Total Protein: 6.3 g/dL (ref 6.0–8.5)

## 2019-01-17 LAB — B. BURGDORFI ANTIBODIES: Lyme IgG/IgM Ab: <0.91 {ISR} (ref 0.00–0.90)

## 2019-01-17 LAB — ANA W/REFLEX: Anti Nuclear Antibody (ANA): NEGATIVE

## 2019-01-17 LAB — ANGIOTENSIN CONVERTING ENZYME: Angio Convert Enzyme: 28 U/L (ref 14–82)

## 2019-01-17 LAB — VITAMIN B12: Vitamin B-12: 432 pg/mL (ref 232–1245)

## 2019-02-09 ENCOUNTER — Other Ambulatory Visit: Payer: Self-pay | Admitting: Neurology

## 2019-05-23 ENCOUNTER — Other Ambulatory Visit: Payer: Self-pay | Admitting: Neurology

## 2019-06-04 NOTE — Progress Notes (Signed)
PATIENT: Edward [name] DOB: Sep 27, 1945  REASON FOR VISIT: follow up HISTORY FROM: patient  HISTORY OF PRESENT ILLNESS: Today 06/05/19  Edward [name] is a 74 year old male with history of ocular myasthenia gravis. He has been able to decrease his dose of prednisone and has tolerated this well.  He is currently taking 6 mg daily.  He does report that he notices if he concentrates on something for too long, such as his woodworking, after about 2 hours, he may have blurry vision.  This may also happen when doing puzzles or watching TV.  This has not gotten worse with the dose reduction of prednisone, rather has remained constant.  He remains on Mestinon.  He denies any trouble swallowing or chewing.  At his last visit, he complained of right hip and leg pain.  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  Laboratory evaluation, B12, ANA, B burgdorferi, multiple myeloma panel, angiotensin-converting enzyme was normal.  He went to see a chiropractor for the back pain, and has since improved.  He is not complaining of any back or leg pain.  X-ray of the lumbar spine in August showed multilevel arthritic changes, and mild retrolisthesis.  He does report he may have some numbness in his feet.  He has not fallen, but may stumble.  He said he sprained his right ankle last week, never remembered doing it.  He is not able to lead with the right leg when walking upstairs.  He has complained of some pain to his left mid arm, around the elbow.  He never took the gabapentin he was prescribed.  He presents today for evaluation accompanied by his wife.  HISTORY 12/03/2018 Dr. Jannifer Franklin: Edward [name] is a 74 year old right-handed white male with a history of ocular myasthenia gravis.  He has come down to 8 mg daily dose of prednisone and has tolerated this well.  He unfortunately now is having some problems with right hip and right thigh pain and some  sensation of weakness in that leg.  He reports numbness in the feet that has been gone for several years.  He does have some gait instability, he may stumble on occasion but he has not fallen.  He has not had any recent evaluation of his low back.  At times, he may have severe cramps in the right thigh medially that may result in severe pain and partial collapse of the leg.  He comes to this office for an evaluation.  He is not having much in the way of double vision or droopy eyelids.   REVIEW OF SYSTEMS: Out of a complete 14 system review of symptoms, the patient complains only of the following symptoms, and all other reviewed systems are negative.  Blurry vision, numbness  ALLERGIES: No Known Allergies  HOME MEDICATIONS: Outpatient Medications Prior to Visit  Medication Sig Dispense Refill  . allopurinol (ZYLOPRIM) 300 MG tablet Take 300 mg by mouth daily.    Marland Kitchen amLODipine-benazepril (LOTREL) 5-10 MG per capsule Take 1 capsule by mouth daily.    Marland Kitchen atorvastatin (LIPITOR) 20 MG tablet Take 20 mg by mouth daily.  0  . Biotin 1000 MCG tablet Take 2,500 mcg by mouth daily.     . cetirizine (ZYRTEC) 10 MG tablet Take 10 mg by mouth daily.    . diclofenac (VOLTAREN) 75 MG EC tablet Take 1 tablet (75 mg total) by mouth 2 (two) times daily. 50 tablet 2  .  fish oil-omega-3 fatty acids 1000 MG capsule Take 1 g by mouth 2 (two) times daily.     . Multiple Vitamin (MULTIVITAMIN) capsule Take 1 capsule by mouth daily.    Marland Kitchen omeprazole (PRILOSEC) 20 MG capsule Take 20 mg by mouth daily.    . Rivaroxaban 15 & 20 MG TBPK Take as directed on package: Start with one '15mg'$  tablet by mouth twice a day with food. On Day 22, switch to one '20mg'$  tablet once a day with food. 51 each 0  . XARELTO 20 MG TABS tablet     . predniSONE (DELTASONE) 5 MG tablet TAKE 1 TABLET BY MOUTH EVERY DAY 90 tablet 2  . pyridostigmine (MESTINON) 60 MG tablet TAKE 1/2 TABLET 3 TIMES DAILY 135 tablet 1  . gabapentin (NEURONTIN) 100 MG  capsule Take 1 capsule (100 mg total) by mouth 3 (three) times daily. 90 capsule 3  . indomethacin (INDOCIN) 50 MG capsule Take 50 mg by mouth daily as needed (pain).     Marland Kitchen lidocaine (LIDODERM) 5 % Place 1 patch onto the skin daily. Remove & Discard patch within 12 hours or as directed by MD 30 patch 1  . predniSONE (DELTASONE) 1 MG tablet TAKE 4 TABLETS DAILY FOR 2 MONTHS, THEN TAKE 3 TABLETS A DAY 360 tablet 2   No facility-administered medications prior to visit.    PAST MEDICAL HISTORY: Past Medical History:  Diagnosis Date  . Allergic rhinitis   . 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 Chronic kidney disease    kidney stones  . Colon polyps   . Diplopia 10/15/2015  . Diverticulosis   . Erectile dysfunction   . GERD (gastroesophageal reflux disease)    with Barrett's esophagus  . Gout   . Hearing loss    hearing aides  . HTN (hypertension)   . Hypercholesteremia   . Migraine headache   . Myasthenia gravis ([place] [place])   . Obesity   . OSA (obstructive sleep apnea)    AHI 43/hr now on CPAP at 10cm H2O  . Peripheral neuropathy 01/14/2019  . Right foot drop 12/03/2018    PAST SURGICAL HISTORY: Past Surgical History:  Procedure Laterality Date  . laser assisted uvuloplasty      FAMILY HISTORY: Family History  Problem Relation Age of Onset  . Hypertension Mother   . Heart attack Father   . Heart disease Father   . Hypertension Sister   . Heart disease Brother   . Hypertension Brother   . Hypertension Brother   . Anemia Brother   . Cancer Brother     SOCIAL HISTORY: Social History   Socioeconomic History  . Marital status: Married    Spouse name: Not on file  . Number of children: 2  . Years of education: College  . Highest education level: Not on file  Occupational History  . Occupation: Retired  Tobacco Use  . Smoking status: Never Smoker  . Smokeless tobacco: Never Used  Substance and  Sexual Activity  . Alcohol use: Yes    Alcohol/week: 0.0 standard drinks    Comment: rare wine  . Drug use: No  . Sexual activity: Not on file  Other Topics Concern  . Not on file  Social History Narrative   Lives home w/ his wife   Right-handed    Drinks about 3 cups of coffee per day   Social Determinants of Health  Financial Resource Strain:   . Difficulty of Paying Living Expenses: Not on file  Food Insecurity:   . Worried About Charity fundraiser in the Last Year: Not on file  . Ran Out of Food in the Last Year: Not on file  Transportation Needs:   . Lack of Transportation (Medical): Not on file  . Lack of Transportation (Non-Medical): Not on file  Physical Activity:   . Days of Exercise per Week: Not on file  . Minutes of Exercise per Session: Not on file  Stress:   . Feeling of Stress : Not on file  Social Connections:   . Frequency of Communication with Friends and Family: Not on file  . Frequency of Social Gatherings with Friends and Family: Not on file  . Attends Religious Services: Not on file  . Active Member of Clubs or Organizations: Not on file  . Attends Archivist Meetings: Not on file  . Marital Status: Not on file  Intimate Partner Violence:   . Fear of Current or Ex-Partner: Not on file  . Emotionally Abused: Not on file  . Physically Abused: Not on file  . Sexually Abused: Not on file   PHYSICAL EXAM  Vitals:   06/05/19 0943  BP: 110/70  Pulse: 77  Temp: (!) 93.1 F (33.9 C)  Weight: 222 lb 6.4 oz (100.9 kg)  Height: _0  (1.753 m)   Body mass index is 32.84 kg/m.  Generalized: Well developed, in no acute distress   Neurological examination  Mentation: Alert oriented to time, place, history taking. Follows all commands speech and language fluent Cranial nerve II-XII: Pupils were equal round reactive to light. Extraocular movements were full, visual field were full on confrontational test. Facial sensation and strength were  normal. Head turning and shoulder shrug were normal and symmetric.  With superior gaze for 1 minute, no subjective diplopia. Motor: Good strength of all extremities, right foot, weakness with dorsiflexion Sensory: Stocking pattern sensory deficit to pinprick bilaterally Coordination: Cerebellar testing reveals good finger-nose-finger and heel-to-shin bilaterally.  Gait and station: Able to rise from seated position without pushoff, gait is somewhat wide-based, limp on the right Reflexes: Deep tendon reflexes are symmetric  DIAGNOSTIC DATA (LABS, IMAGING, TESTING) - I reviewed patient records, labs, notes, testing and imaging myself where available.  Lab Results  Component Value Date   WBC 7.7 10/15/2015   HGB 14.5 10/15/2015   HCT 43.9 10/15/2015   MCV 88 10/15/2015   PLT 214 10/15/2015      Component Value Date/Time   NA 141 10/15/2015 1019   K 5.2 10/15/2015 1019   CL 102 10/15/2015 1019   CO2 21 10/15/2015 1019   GLUCOSE 111 (H) 10/15/2015 1019   BUN 30 (H) 10/15/2015 1019   CREATININE 1.16 10/15/2015 1019   CALCIUM 9.7 10/15/2015 1019   PROT 6.3 01/14/2019 1640   ALBUMIN 4.3 10/15/2015 1019   AST 25 10/15/2015 1019   ALT 21 10/15/2015 1019   ALKPHOS 54 10/15/2015 1019   BILITOT 0.3 10/15/2015 1019   GFRNONAA 64 10/15/2015 1019   GFRAA 74 10/15/2015 1019   No results found for: CHOL, HDL, LDLCALC, LDLDIRECT, TRIG, CHOLHDL No results found for: HGBA1C Lab Results  Component Value Date   ZYSAYTKZ60 109 01/14/2019   Lab Results  Component Value Date   TSH 1.500 10/15/2015      ASSESSMENT AND PLAN 74 y.o. year old male  has a past medical history of Allergic rhinitis, Aortic  valve disease (07/27/2016), Chronic kidney disease, Colon polyps, Diplopia (10/15/2015), Diverticulosis, Erectile dysfunction, GERD (gastroesophageal reflux disease), Gout, Hearing loss, HTN (hypertension), Hypercholesteremia, Migraine headache, Myasthenia gravis ([place]), Obesity, OSA (obstructive  sleep apnea), Peripheral neuropathy (01/14/2019), and Right foot drop (12/03/2018). here with:  1.  Ocular myasthenia gravis 2.  Peripheral neuropathy, relatively severe severity 3.  Right foot drop  He will continue to try dose reduction of prednisone to 5 mg daily.  He does report some fatigue of his eyes, with prolonged concentration during activities after 2 hours.  This has remained constant during his course of myasthenia.  He will remain on Mestinon.  If he has any worsening symptoms, he will contact our office.  Nerve conduction evaluation showed evidence of a diffuse peripheral neuropathy of relatively severe severity, no evidence of overlying lumbosacral radiculopathy was noted.  Laboratory evaluation did not show any etiology for the neuropathy.  He is not reporting any low back or right leg pain.  X-ray of the lumbar spine showed multilevel arthritic changes, and mild retrolisthesis.  The neuropathy symptoms do not seem to be especially bothersome at this time.  He should be very careful not to fall, be aware of the numbness in is feet for injury prevention.  He will follow-up here in 6 months or sooner if needed with Dr. Jannifer Franklin.  I did advise if his symptoms worsen or if he develops any new symptoms he should let us know.  I spent 25 minutes with the patient. 50% of this time was spent discussing his plan of care.  Butler Denmark, AGNP-C, DNP 06/05/2019, 11:49 AM [facility] [address], [place], [place] [address] [phone]

## 2019-06-05 ENCOUNTER — Ambulatory Visit (INDEPENDENT_AMBULATORY_CARE_PROVIDER_SITE_OTHER): Payer: Medicare PPO | Admitting: Neurology

## 2019-06-05 ENCOUNTER — Encounter: Payer: Self-pay | Admitting: Neurology

## 2019-06-05 ENCOUNTER — Other Ambulatory Visit: Payer: Self-pay

## 2019-06-05 VITALS — BP 110/70 | HR 77 | Temp 93.1°F | Ht 69.0 in | Wt 222.4 lb

## 2019-06-05 DIAGNOSIS — G7000 Myasthenia gravis without (acute) exacerbation: Secondary | ICD-10-CM

## 2019-06-05 DIAGNOSIS — G603 Idiopathic progressive neuropathy: Secondary | ICD-10-CM

## 2019-06-05 MED ORDER — PREDNISONE 5 MG PO TABS: ORAL_TABLET | ORAL | 2 refills | Status: DC

## 2019-06-05 MED ORDER — PYRIDOSTIGMINE BROMIDE 60 MG PO TABS: ORAL_TABLET | ORAL | 1 refills | Status: DC

## 2019-06-05 NOTE — Progress Notes (Signed)
I have read the note, and I agree with the clinical assessment and plan.  Ovadia Lopp K Goldman Birchall   

## 2019-06-05 NOTE — Patient Instructions (Signed)
You can try prednisone 5 mg daily, let us know if you have any problems.

## 2019-06-08 ENCOUNTER — Ambulatory Visit: Payer: TRICARE For Life (TFL) | Attending: Internal Medicine

## 2019-06-08 DIAGNOSIS — Z23 Encounter for immunization: Secondary | ICD-10-CM | POA: Insufficient documentation

## 2019-06-08 NOTE — Progress Notes (Signed)
   Covid-19 Vaccination Clinic  Name:  Edward [name]    MRN: [id number] DOB: October 25, 1945  06/08/2019  [name] was observed post Covid-19 immunization for 15 minutes without incidence. He was provided with Vaccine Information Sheet and instruction to access the V-Safe system.   [name] was instructed to call 911 with any severe reactions post vaccine: Marland Kitchen Difficulty breathing  . Swelling of your face and throat  . A fast heartbeat  . A bad rash all over your body  . Dizziness and weakness    Immunizations Administered    Name Date Dose VIS Date Route   Pfizer COVID-19 Vaccine 06/08/2019  2:16 PM 0.3 mL 04/05/2019 Intramuscular   Manufacturer: [employer], [employer]   Lot: [id number]   NDC: [address]-2

## 2019-07-01 ENCOUNTER — Ambulatory Visit: Payer: TRICARE For Life (TFL) | Attending: Internal Medicine

## 2019-07-01 DIAGNOSIS — Z23 Encounter for immunization: Secondary | ICD-10-CM

## 2019-07-01 NOTE — Progress Notes (Signed)
   Covid-19 Vaccination Clinic  Name:  Edward [name]    MRN: [id number] DOB: 1945/10/04  07/01/2019  [name] was observed post Covid-19 immunization for 15 minutes without incident. He was provided with Vaccine Information Sheet and instruction to access the V-Safe system.   [name] was instructed to call 911 with any severe reactions post vaccine: Marland Kitchen Difficulty breathing  . Swelling of face and throat  . A fast heartbeat  . A bad rash all over body  . Dizziness and weakness   Immunizations Administered    Name Date Dose VIS Date Route   Pfizer COVID-19 Vaccine 07/01/2019 12:02 PM 0.3 mL 04/05/2019 Intramuscular   Manufacturer: [employer], [employer]   Lot: [id number]   NDC: [address]-2

## 2019-10-24 DIAGNOSIS — M7752 Other enthesopathy of left foot: Secondary | ICD-10-CM | POA: Diagnosis not present

## 2019-10-24 DIAGNOSIS — M109 Gout, unspecified: Secondary | ICD-10-CM | POA: Diagnosis not present

## 2019-11-08 DIAGNOSIS — G4733 Obstructive sleep apnea (adult) (pediatric): Secondary | ICD-10-CM | POA: Diagnosis not present

## 2019-12-09 DIAGNOSIS — G4733 Obstructive sleep apnea (adult) (pediatric): Secondary | ICD-10-CM | POA: Diagnosis not present

## 2019-12-12 ENCOUNTER — Telehealth: Payer: Self-pay | Admitting: Neurology

## 2019-12-12 ENCOUNTER — Ambulatory Visit (INDEPENDENT_AMBULATORY_CARE_PROVIDER_SITE_OTHER): Payer: Medicare PPO | Admitting: Neurology

## 2019-12-12 ENCOUNTER — Encounter: Payer: Self-pay | Admitting: Neurology

## 2019-12-12 VITALS — BP 118/72 | HR 67 | Ht 69.0 in | Wt 224.0 lb

## 2019-12-12 DIAGNOSIS — G7000 Myasthenia gravis without (acute) exacerbation: Secondary | ICD-10-CM | POA: Diagnosis not present

## 2019-12-12 DIAGNOSIS — G603 Idiopathic progressive neuropathy: Secondary | ICD-10-CM | POA: Diagnosis not present

## 2019-12-12 DIAGNOSIS — Z5181 Encounter for therapeutic drug level monitoring: Secondary | ICD-10-CM

## 2019-12-12 DIAGNOSIS — M5431 Sciatica, right side: Secondary | ICD-10-CM

## 2019-12-12 DIAGNOSIS — M21371 Foot drop, right foot: Secondary | ICD-10-CM

## 2019-12-12 MED ORDER — MYCOPHENOLATE MOFETIL 250 MG PO CAPS: 250.0000 mg | ORAL_CAPSULE | Freq: Two times a day (BID) | ORAL | 5 refills | Status: DC

## 2019-12-12 NOTE — Patient Instructions (Signed)
We will start Cellcept for the myasthenia.  

## 2019-12-12 NOTE — Telephone Encounter (Signed)
Edward [name]: [id number] (exp. 12/12/19 to 01/11/20)/tricare order sent to [facility]. They will reach out to the patient to schedule.

## 2019-12-12 NOTE — Progress Notes (Signed)
Reason for visit: Myasthenia gravis, low back pain  Edward [name] is an 74 y.o. male     History of present illness:  Edward [name] is a 74 year old right-handed white male with a history of myasthenia gravis primarily with ocular features.  He has a history of sleep apnea on CPAP, he has a documented peripheral neuropathy with right greater than left foot drops.  He has some gait instability associated with this.  He also has low back pain and sciatica type pain down the right leg that will come and go.  He may have occasional problems with muscle cramps.  He has had increased fatigue, particularly in the hot summer months.  He has frequent episodes of blurred vision and he uses an ice pack while driving to improve this.  He denies any issues with chewing or swallowing.  The patient has been able to reduce his prednisone dose to 5 mg daily.  He remains on Mestinon.  He returns to the office today for further evaluation.  Past Medical History:  Diagnosis Date  . Allergic rhinitis   . 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 Chronic kidney disease    kidney stones  . Colon polyps   . Diplopia 10/15/2015  . Diverticulosis   . Erectile dysfunction   . GERD (gastroesophageal reflux disease)    with Barrett's esophagus  . Gout   . Hearing loss    hearing aides  . HTN (hypertension)   . Hypercholesteremia   . Migraine headache   . Myasthenia gravis ([facility])   . Obesity   . OSA (obstructive sleep apnea)    AHI 43/hr now on CPAP at 10cm H2O  . Peripheral neuropathy 01/14/2019  . Right foot drop 12/03/2018    Past Surgical History:  Procedure Laterality Date  . laser assisted uvuloplasty      Family History  Problem Relation Age of Onset  . Hypertension Mother   . Heart attack Father   . Heart disease Father   . Hypertension Sister   . Heart disease Brother   . Hypertension Brother   . Hypertension Brother   .  Anemia Brother   . Cancer Brother     Social history:  reports that he has never smoked. He has never used smokeless tobacco. He reports current alcohol use. He reports that he does not use drugs.   No Known Allergies  Medications:  Prior to Admission medications   Medication Sig Start Date End Date Taking? Authorizing Provider  allopurinol (ZYLOPRIM) 300 MG tablet Take 300 mg by mouth daily.   Yes [provider]  amLODipine-benazepril (LOTREL) 5-10 MG per capsule Take 1 capsule by mouth daily.   Yes [provider]  atorvastatin (LIPITOR) 20 MG tablet Take 20 mg by mouth daily. 03/26/17  Yes [provider]  Biotin 1000 MCG tablet Take 2,500 mcg by mouth daily.    Yes [provider]  cetirizine (ZYRTEC) 10 MG tablet Take 10 mg by mouth daily.   Yes [provider]  fish oil-omega-3 fatty acids 1000 MG capsule Take 1 g by mouth 2 (two) times daily.    Yes [provider]  lidocaine (LIDODERM) 5 % Place 1 patch onto the skin daily. Remove & Discard patch within 12 hours or as directed by MD 12/03/18  Yes York Spaniel, MD  Multiple Vitamin (MULTIVITAMIN) capsule Take 1 capsule  by mouth daily.   Yes [provider]  omeprazole (PRILOSEC) 20 MG capsule Take 20 mg by mouth daily.   Yes [provider]  predniSONE (DELTASONE) 5 MG tablet TAKE 1 TABLET BY MOUTH EVERY DAY 06/05/19  Yes Glean Salvo, NP  pyridostigmine (MESTINON) 60 MG tablet Take 1/2 tablet 3 times a day 06/05/19  Yes Glean Salvo, NP  Rivaroxaban 15 & 20 MG TBPK Take as directed on package: Start with one 15mg  tablet by mouth twice a day with food. On Day 22, switch to one 20mg  tablet once a day with food. 08/01/17  Yes , PA-C  XARELTO 20 MG TABS tablet  03/10/18  Yes [provider]    ROS:  Out of a complete 14 system review of symptoms, the patient complains only of the following symptoms, and all other reviewed systems are  negative.  Fatigue Blurred vision Weakness Back pain, right leg pain  Blood pressure 118/72, pulse 67, height 5\' 9"  (1.753 m), weight 224 lb (101.6 kg).  Physical Exam  General: The patient is alert and cooperative at the time of the examination.  The patient is moderately obese.  Skin: No significant peripheral edema is noted.   Neurologic Exam  Mental status: The patient is alert and oriented x 3 at the time of the examination. The patient has apparent normal recent and remote memory, with an apparently normal attention span and concentration ability.   Cranial nerves: Facial symmetry is present. Speech is normal, no aphasia or dysarthria is noted. Extraocular movements are full. Visual fields are full.  With superior gaze for 1 minute, the patient reports subjective double vision within 45 seconds.  No significant increase in ptosis is seen.  Motor: The patient has good strength in all 4 extremities, with exception of some slight weakness of intrinsic muscles of the hands bilaterally and bilateral foot drop, right greater than left.  With arms outstretched for 1 minute, no fatigable weakness of the deltoid muscles were noted.  Sensory examination: Soft touch sensation is symmetric on the face, arms, and legs.  Coordination: The patient has good finger-nose-finger and heel-to-shin bilaterally.  Gait and station: The patient has a slightly wide-based gait, tandem gait is unsteady but he is able to perform this.  Romberg is negative.  Reflexes: Deep tendon reflexes are symmetric, but are depressed.   Assessment/Plan:  1.  Myasthenia gravis  2.  Low back pain, right-sided sciatica  3.  Peripheral neuropathy, bilateral foot drop  4.  Sleep apnea on CPAP  5.  History of recurrent DVT, chronic anticoagulation  6.  Gout  The patient is having ongoing symptoms with blurring and double vision.  He is on low-dose prednisone taking 5 mg daily.  He will continue the Mestinon, we  will add low-dose CellCept, we will check blood work today as a baseline.  The patient will be set up for MRI of the lumbar spine.  He will follow up here in about 6 months.  Antony Madura MD 12/12/2019 9:04 AM  [facility] [address] [place], 12/14/2019 [address]  Phone [phone] Fax [phone]

## 2019-12-13 LAB — COMPREHENSIVE METABOLIC PANEL
ALT: 25 IU/L (ref 0–44)
AST: 36 IU/L (ref 0–40)
Albumin/Globulin Ratio: 1.6 (ref 1.2–2.2)
Albumin: 4.1 g/dL (ref 3.7–4.7)
Alkaline Phosphatase: 59 IU/L (ref 48–121)
BUN/Creatinine Ratio: 21 (ref 10–24)
BUN: 26 mg/dL (ref 8–27)
Bilirubin Total: 0.6 mg/dL (ref 0.0–1.2)
CO2: 24 mmol/L (ref 20–29)
Calcium: 9.3 mg/dL (ref 8.6–10.2)
Chloride: 106 mmol/L (ref 96–106)
Creatinine, Ser: 1.26 mg/dL (ref 0.76–1.27)
GFR calc Af Amer: 65 mL/min/{1.73_m2} (ref >59)
GFR calc non Af Amer: 56 mL/min/{1.73_m2} — ABNORMAL LOW (ref >59)
Globulin, Total: 2.5 g/dL (ref 1.5–4.5)
Glucose: 110 mg/dL — ABNORMAL HIGH (ref 65–99)
Potassium: 4.8 mmol/L (ref 3.5–5.2)
Sodium: 141 mmol/L (ref 134–144)
Total Protein: 6.6 g/dL (ref 6.0–8.5)

## 2019-12-13 LAB — CBC WITH DIFFERENTIAL/PLATELET
Basophils Absolute: 0.1 10*3/uL (ref 0.0–0.2)
Basos: 1 %
EOS (ABSOLUTE): 0.2 10*3/uL (ref 0.0–0.4)
Eos: 3 %
Hematocrit: 42.5 % (ref 37.5–51.0)
Hemoglobin: 15 g/dL (ref 13.0–17.7)
Immature Grans (Abs): 0 10*3/uL (ref 0.0–0.1)
Immature Granulocytes: 0 %
Lymphocytes Absolute: 1.1 10*3/uL (ref 0.7–3.1)
Lymphs: 15 %
MCH: 31.6 pg (ref 26.6–33.0)
MCHC: 35.3 g/dL (ref 31.5–35.7)
MCV: 90 fL (ref 79–97)
Monocytes Absolute: 0.8 10*3/uL (ref 0.1–0.9)
Monocytes: 11 %
Neutrophils Absolute: 5.5 10*3/uL (ref 1.4–7.0)
Neutrophils: 70 %
Platelets: 208 10*3/uL (ref 150–450)
RBC: 4.75 x10E6/uL (ref 4.14–5.80)
RDW: 13.2 % (ref 11.6–15.4)
WBC: 7.8 10*3/uL (ref 3.4–10.8)

## 2019-12-14 ENCOUNTER — Ambulatory Visit
Admission: RE | Admit: 2019-12-14 | Discharge: 2019-12-14 | Disposition: A | Discharge status: Home / Self Care | Payer: Medicare PPO | Source: Ambulatory Visit | Attending: Neurology | Admitting: Neurology

## 2019-12-14 ENCOUNTER — Other Ambulatory Visit: Payer: Self-pay

## 2019-12-14 DIAGNOSIS — M5431 Sciatica, right side: Secondary | ICD-10-CM

## 2019-12-18 ENCOUNTER — Telehealth: Payer: Self-pay | Admitting: Neurology

## 2019-12-18 DIAGNOSIS — M5431 Sciatica, right side: Secondary | ICD-10-CM

## 2019-12-18 NOTE — Telephone Encounter (Signed)
I called the patient spoke with his wife, based on MRI of lumbar spine findings. Will refer the patient for neurosurgical evaluation. He continues to complain of low back pain, radiating down the right leg.   IMPRESSION: This MRI of the lumbar spine without contrast shows the following: 1.    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  There is moderate right foraminal narrowing and moderate right lateral recess stenosis but no definite nerve root compression. 2.    At L5-S1, there are degenerative changes including mild retrolisthesis, advanced endplate spurring, severe loss of disc height and minimal facet hypertrophy.  These cause moderately severe right foraminal narrowing that could lead to right L5 nerve root compression. 3.    There are milder degenerative changes at L1-L2, L2-L3 and L3-L4 as detailed above that do not lead to nerve root compression or spinal stenosis.

## 2019-12-19 NOTE — Telephone Encounter (Signed)
Agree with plan, MRI shows potential for right L5 nerve root impingement, the patient is complaining of right-sided sciatica.

## 2020-01-07 DIAGNOSIS — G4733 Obstructive sleep apnea (adult) (pediatric): Secondary | ICD-10-CM | POA: Diagnosis not present

## 2020-01-09 DIAGNOSIS — H903 Sensorineural hearing loss, bilateral: Secondary | ICD-10-CM | POA: Diagnosis not present

## 2020-01-09 DIAGNOSIS — G4733 Obstructive sleep apnea (adult) (pediatric): Secondary | ICD-10-CM | POA: Diagnosis not present

## 2020-01-15 DIAGNOSIS — M4310 Spondylolisthesis, site unspecified: Secondary | ICD-10-CM | POA: Diagnosis not present

## 2020-01-15 DIAGNOSIS — M48061 Spinal stenosis, lumbar region without neurogenic claudication: Secondary | ICD-10-CM | POA: Diagnosis not present

## 2020-01-18 DIAGNOSIS — S00522A Blister (nonthermal) of oral cavity, initial encounter: Secondary | ICD-10-CM | POA: Diagnosis not present

## 2020-02-08 DIAGNOSIS — G4733 Obstructive sleep apnea (adult) (pediatric): Secondary | ICD-10-CM | POA: Diagnosis not present

## 2020-03-03 ENCOUNTER — Other Ambulatory Visit: Payer: Self-pay | Admitting: Neurology

## 2020-03-10 DIAGNOSIS — G4733 Obstructive sleep apnea (adult) (pediatric): Secondary | ICD-10-CM | POA: Diagnosis not present

## 2020-03-14 ENCOUNTER — Telehealth: Payer: Self-pay | Admitting: Neurology

## 2020-03-14 DIAGNOSIS — Z5181 Encounter for therapeutic drug level monitoring: Secondary | ICD-10-CM

## 2020-03-14 NOTE — Telephone Encounter (Signed)
I called the patient and have him come in to get blood work on CellCept, he indicated that he never started the medication as he was afraid of the potential side effects.  We therefore will not get blood work.

## 2020-03-16 DIAGNOSIS — H6121 Impacted cerumen, right ear: Secondary | ICD-10-CM | POA: Diagnosis not present

## 2020-03-24 DIAGNOSIS — M48061 Spinal stenosis, lumbar region without neurogenic claudication: Secondary | ICD-10-CM | POA: Diagnosis not present

## 2020-03-24 DIAGNOSIS — Z6833 Body mass index (BMI) 33.0-33.9, adult: Secondary | ICD-10-CM | POA: Diagnosis not present

## 2020-03-24 DIAGNOSIS — I10 Essential (primary) hypertension: Secondary | ICD-10-CM | POA: Diagnosis not present

## 2020-04-02 DIAGNOSIS — L918 Other hypertrophic disorders of the skin: Secondary | ICD-10-CM | POA: Diagnosis not present

## 2020-04-02 DIAGNOSIS — Z85828 Personal history of other malignant neoplasm of skin: Secondary | ICD-10-CM | POA: Diagnosis not present

## 2020-04-02 DIAGNOSIS — L821 Other seborrheic keratosis: Secondary | ICD-10-CM | POA: Diagnosis not present

## 2020-04-02 DIAGNOSIS — L570 Actinic keratosis: Secondary | ICD-10-CM | POA: Diagnosis not present

## 2020-04-02 DIAGNOSIS — L820 Inflamed seborrheic keratosis: Secondary | ICD-10-CM | POA: Diagnosis not present

## 2020-04-09 DIAGNOSIS — G4733 Obstructive sleep apnea (adult) (pediatric): Secondary | ICD-10-CM | POA: Diagnosis not present

## 2020-04-15 DIAGNOSIS — K219 Gastro-esophageal reflux disease without esophagitis: Secondary | ICD-10-CM | POA: Diagnosis not present

## 2020-04-15 DIAGNOSIS — R7303 Prediabetes: Secondary | ICD-10-CM | POA: Diagnosis not present

## 2020-04-15 DIAGNOSIS — M109 Gout, unspecified: Secondary | ICD-10-CM | POA: Diagnosis not present

## 2020-04-15 DIAGNOSIS — Z0000 Encounter for general adult medical examination without abnormal findings: Secondary | ICD-10-CM | POA: Diagnosis not present

## 2020-04-15 DIAGNOSIS — E782 Mixed hyperlipidemia: Secondary | ICD-10-CM | POA: Diagnosis not present

## 2020-04-15 DIAGNOSIS — I825Z9 Chronic embolism and thrombosis of unspecified deep veins of unspecified distal lower extremity: Secondary | ICD-10-CM | POA: Diagnosis not present

## 2020-04-15 DIAGNOSIS — I10 Essential (primary) hypertension: Secondary | ICD-10-CM | POA: Diagnosis not present

## 2020-04-15 DIAGNOSIS — G4733 Obstructive sleep apnea (adult) (pediatric): Secondary | ICD-10-CM | POA: Diagnosis not present

## 2020-04-15 DIAGNOSIS — D509 Iron deficiency anemia, unspecified: Secondary | ICD-10-CM | POA: Diagnosis not present

## 2020-04-25 DIAGNOSIS — I2510 Atherosclerotic heart disease of native coronary artery without angina pectoris: Secondary | ICD-10-CM

## 2020-04-25 HISTORY — DX: Atherosclerotic heart disease of native coronary artery without angina pectoris: I25.10

## 2020-05-06 DIAGNOSIS — G4733 Obstructive sleep apnea (adult) (pediatric): Secondary | ICD-10-CM | POA: Diagnosis not present

## 2020-05-10 DIAGNOSIS — G4733 Obstructive sleep apnea (adult) (pediatric): Secondary | ICD-10-CM | POA: Diagnosis not present

## 2020-06-09 DIAGNOSIS — M65331 Trigger finger, right middle finger: Secondary | ICD-10-CM | POA: Diagnosis not present

## 2020-06-09 DIAGNOSIS — M65321 Trigger finger, right index finger: Secondary | ICD-10-CM | POA: Diagnosis not present

## 2020-06-09 DIAGNOSIS — M65322 Trigger finger, left index finger: Secondary | ICD-10-CM | POA: Diagnosis not present

## 2020-06-22 ENCOUNTER — Encounter: Payer: Self-pay | Admitting: Neurology

## 2020-06-22 ENCOUNTER — Other Ambulatory Visit: Payer: Self-pay

## 2020-06-22 ENCOUNTER — Ambulatory Visit (INDEPENDENT_AMBULATORY_CARE_PROVIDER_SITE_OTHER): Payer: Medicare PPO | Admitting: Neurology

## 2020-06-22 VITALS — BP 141/79 | HR 81 | Ht 69.0 in | Wt 219.0 lb

## 2020-06-22 DIAGNOSIS — G603 Idiopathic progressive neuropathy: Secondary | ICD-10-CM | POA: Diagnosis not present

## 2020-06-22 DIAGNOSIS — H532 Diplopia: Secondary | ICD-10-CM | POA: Diagnosis not present

## 2020-06-22 DIAGNOSIS — G7000 Myasthenia gravis without (acute) exacerbation: Secondary | ICD-10-CM | POA: Diagnosis not present

## 2020-06-22 NOTE — Progress Notes (Signed)
Reason for visit: Myasthenia gravis, peripheral neuropathy, gait disorder  Edward [name] is an 75 y.o. male  History of present illness:  Edward [name] is a 75 year old right-handed white male with a history of myasthenia gravis primarily with ocular features.  When last seen, we tried to add CellCept to his regimen as he was having increasing problems with double vision.  He decided not to take the medication, instead he increased his prednisone dose to 6 mg daily.  He still has some double vision, if he is performing a visual task for more than an hour he will have to stop.  He denies any significant ptosis.  He reports that he is having some issues with balance, he had a recent fall in part related to his peripheral neuropathy.  He also reports that he will move his jaw side to side at night, he does not do this during the daytime.  The patient does have some low back pain issues.  He returns the office today for further evaluation.  He denies problems with chewing or swallowing.  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 by echo 07/2017   Chronic kidney disease    kidney stones   Colon polyps    Diplopia 10/15/2015   Diverticulosis    Erectile dysfunction    GERD (gastroesophageal reflux disease)    with Barrett's esophagus   Gout    Hearing loss    hearing aides   HTN (hypertension)    Hypercholesteremia    Migraine headache    Myasthenia gravis (HCC)    Obesity    OSA (obstructive sleep apnea)    AHI 43/hr now on CPAP at 10cm H2O   Peripheral neuropathy 01/14/2019   Right foot drop 12/03/2018    Past Surgical History:  Procedure Laterality Date   laser assisted uvuloplasty      Family History  Problem Relation Age of Onset   Hypertension Mother    Heart attack Father    Heart disease Father    Hypertension Sister    Heart disease  Brother    Hypertension Brother    Hypertension Brother    Anemia Brother    Cancer Brother     Social history:  reports that he has never smoked. He has never used smokeless tobacco. He reports current alcohol use. He reports that he does not use drugs.   No Known Allergies  Medications:  Prior to Admission medications   Medication Sig Start Date End Date Taking? Authorizing Provider  allopurinol (ZYLOPRIM) 300 MG tablet Take 300 mg by mouth daily.   Yes [provider]  amLODipine-benazepril (LOTREL) 5-10 MG per capsule Take 1 capsule by mouth daily.   Yes [provider]  atorvastatin (LIPITOR) 20 MG tablet Take 20 mg by mouth daily. 03/26/17  Yes [provider]  Biotin 1000 MCG tablet Take 2,500 mcg by mouth daily.    Yes [provider]  cetirizine (ZYRTEC) 10 MG tablet Take 10 mg by mouth daily.   Yes [provider]  fish oil-omega-3 fatty acids 1000 MG capsule Take 1 g by mouth 2 (two) times daily.    Yes [provider]  lidocaine (LIDODERM) 5 % Place 1 patch onto the skin daily. Remove & Discard patch within 12 hours or as directed by MD 12/03/18  Yes York Spaniel,  MD  Multiple Vitamin (MULTIVITAMIN) capsule Take 1 capsule by mouth daily.   Yes [provider]  omeprazole (PRILOSEC) 20 MG capsule Take 20 mg by mouth daily.   Yes [provider]  predniSONE (DELTASONE) 5 MG tablet TAKE 1 TABLET BY MOUTH EVERY DAY 06/05/19  Yes Glean Salvo, NP  pyridostigmine (MESTINON) 60 MG tablet TAKE 1/2 TABLET 3 TIMES DAILY 03/03/20  Yes York Spaniel, MD  Rivaroxaban 15 & 20 MG TBPK Take as directed on package: Start with one 15mg  tablet by mouth twice a day with food. On Day 22, switch to one 20mg  tablet once a day with food. 08/01/17  Yes , PA-C  XARELTO 20 MG TABS tablet  03/10/18  Yes [provider]    ROS:  Out of a complete 14 system review of symptoms, the patient complains only  of the following symptoms, and all other reviewed systems are negative.  Walking difficulty Double vision  Blood pressure (!) 141/79, pulse 81, height 5\' 9"  (1.753 m), weight 219 lb (99.3 kg).  Physical Exam  General: The patient is alert and cooperative at the time of the examination.  The patient is moderately to markedly obese.  Skin: No significant peripheral edema is noted.   Neurologic Exam  Mental status: The patient is alert and oriented x 3 at the time of the examination. The patient has apparent normal recent and remote memory, with an apparently normal attention span and concentration ability.   Cranial nerves: Facial symmetry is present. Speech is normal, no aphasia or dysarthria is noted. Extraocular movements are full. Visual fields are full, with exception some restriction of superior gaze.  With superior gaze for 1 minute, the patient has some double vision at around 45 seconds.  No ptosis is seen.  Motor: The patient has good strength in all 4 extremities, with exception of some slight weakness in intrinsic muscles of the hands bilaterally.  With arms outstretched for 1 minute, no fatigable weakness of the deltoid muscles were noted.  Sensory examination: Soft touch sensation is symmetric on the face, arms, and legs.  Coordination: The patient has good finger-nose-finger and heel-to-shin bilaterally.  Gait and station: The patient has a slightly wide-based gait, he can walk independently.  Tandem gait is unsteady.  Romberg is negative but is somewhat unsteady.  Reflexes: Deep tendon reflexes are symmetric.   IMPRESSION: This MRI of the lumbar spine without contrast shows the following: 1. 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 There is moderate right foraminal narrowing and moderate right lateral recess stenosis but no definite nerve root compression. 2. At L5-S1, there are  degenerative changes including mild retrolisthesis, advanced endplate spurring, severe loss of disc height and minimal facet hypertrophy. These cause moderately severe right foraminal narrowing that could lead to right L5 nerve root compression. 3. There are milder degenerative changes at L1-L2, L2-L3 and L3-L4 as detailed above that do not lead to nerve root compression or spinal stenosis.    Assessment/Plan:  1.  Myasthenia gravis with ocular features  2.  Peripheral neuropathy with gait disturbance  The patient will currently continue on his 6 mg daily dose of prednisone, if he wishes to go up on the dose with worsening double vision he will call me.  He may take Mestinon as well, he does not wish to go on CellCept.  He will follow up here in about 6 months.  He  wishes to be seen by Dr. Marjory Lies in the future when I retire.  Marlan Palau MD 06/22/2020 12:02 PM  [facility] [address] [place], [place] [address]  Phone [phone] Fax [phone]

## 2020-06-24 DIAGNOSIS — I10 Essential (primary) hypertension: Secondary | ICD-10-CM | POA: Diagnosis not present

## 2020-06-24 DIAGNOSIS — Z6833 Body mass index (BMI) 33.0-33.9, adult: Secondary | ICD-10-CM | POA: Diagnosis not present

## 2020-06-24 DIAGNOSIS — M4310 Spondylolisthesis, site unspecified: Secondary | ICD-10-CM | POA: Diagnosis not present

## 2020-06-30 ENCOUNTER — Telehealth: Payer: Self-pay | Admitting: Neurology

## 2020-06-30 NOTE — Telephone Encounter (Signed)
Received a note from [facility] neurosurgery, office visit June 24, 2020 with Dr. Jordan Likes.  Follow-up for back pain.  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  Recommended to continue lumbar strengthening and flexibility program, follow-up in 6 months with Dr. Jordan Likes.

## 2020-07-29 DIAGNOSIS — K219 Gastro-esophageal reflux disease without esophagitis: Secondary | ICD-10-CM | POA: Diagnosis not present

## 2020-07-29 DIAGNOSIS — K2270 Barrett's esophagus without dysplasia: Secondary | ICD-10-CM | POA: Diagnosis not present

## 2020-08-03 DIAGNOSIS — R12 Heartburn: Secondary | ICD-10-CM | POA: Diagnosis not present

## 2020-08-03 DIAGNOSIS — R0602 Shortness of breath: Secondary | ICD-10-CM | POA: Diagnosis not present

## 2020-08-03 DIAGNOSIS — R5383 Other fatigue: Secondary | ICD-10-CM | POA: Diagnosis not present

## 2020-08-03 DIAGNOSIS — M79601 Pain in right arm: Secondary | ICD-10-CM | POA: Diagnosis not present

## 2020-08-06 DIAGNOSIS — H2513 Age-related nuclear cataract, bilateral: Secondary | ICD-10-CM | POA: Diagnosis not present

## 2020-08-06 DIAGNOSIS — H349 Unspecified retinal vascular occlusion: Secondary | ICD-10-CM | POA: Diagnosis not present

## 2020-08-12 DIAGNOSIS — G4733 Obstructive sleep apnea (adult) (pediatric): Secondary | ICD-10-CM | POA: Diagnosis not present

## 2020-08-23 ENCOUNTER — Other Ambulatory Visit: Payer: Self-pay | Admitting: Neurology

## 2020-09-07 ENCOUNTER — Other Ambulatory Visit: Payer: Self-pay | Admitting: Neurology

## 2020-09-29 ENCOUNTER — Ambulatory Visit (INDEPENDENT_AMBULATORY_CARE_PROVIDER_SITE_OTHER): Payer: Medicare PPO | Admitting: Cardiology

## 2020-09-29 ENCOUNTER — Other Ambulatory Visit: Payer: Self-pay

## 2020-09-29 ENCOUNTER — Other Ambulatory Visit: Payer: Self-pay | Admitting: Neurology

## 2020-09-29 VITALS — BP 132/68 | HR 84 | Ht 69.0 in | Wt 228.0 lb

## 2020-09-29 DIAGNOSIS — I350 Nonrheumatic aortic (valve) stenosis: Secondary | ICD-10-CM

## 2020-09-29 DIAGNOSIS — I10 Essential (primary) hypertension: Secondary | ICD-10-CM

## 2020-09-29 DIAGNOSIS — R079 Chest pain, unspecified: Secondary | ICD-10-CM

## 2020-09-29 DIAGNOSIS — R0600 Dyspnea, unspecified: Secondary | ICD-10-CM

## 2020-09-29 DIAGNOSIS — R0609 Other forms of dyspnea: Secondary | ICD-10-CM

## 2020-09-29 DIAGNOSIS — E785 Hyperlipidemia, unspecified: Secondary | ICD-10-CM

## 2020-09-29 NOTE — Patient Instructions (Signed)
Medication Instructions:  Your physician recommends that you continue on your current medications as directed. Please refer to the Current Medication list given to you today.  *If you need a refill on your cardiac medications before your next appointment, please call your pharmacy*  Testing/Procedures: Your physician has requested that you have an echocardiogram (in 1-2 weeks). Echocardiography is a painless test that uses sound waves to create images of your heart. It provides your doctor with information about the size and shape of your heart and how well your heart's chambers and valves are working. This procedure takes approximately one hour. There are no restrictions for this procedure.  This will be done at our [facility] location:  [address]  Follow-Up: At [facility], you and your health needs are our priority.  As part of our continuing mission to provide you with exceptional heart care, we have created designated Provider Care Teams.  These Care Teams include your primary Cardiologist (physician) and Advanced Practice Providers (APPs -  Physician Assistants and Nurse Practitioners) who all work together to provide you with the care you need, when you need it.  We recommend signing up for the patient portal called "MyChart".  Sign up information is provided on this After Visit Summary.  MyChart is used to connect with patients for Virtual Visits (Telemedicine).  Patients are able to view lab/test results, encounter notes, upcoming appointments, etc.  Non-urgent messages can be sent to your provider as well.   To learn more about what you can do with MyChart, go to [email or web address].    Your next appointment:    Monday, July 18th at 9:20 AM with Dr. Bjorn Pippin

## 2020-09-29 NOTE — Progress Notes (Addendum)
Cardiology Office Note:    Date:  09/29/2020   ID:  Edward NewerJohn A Agredano, DOB 01/20/1946, MRN [id number]  PCP:  Farris HasMorrow, Aaron, MD  Cardiologist:  None  Electrophysiologist:  None   Referring MD: Farris HasMorrow, Aaron, MD   Chief Complaint  Patient presents with   Aortic Stenosis    History of Present Illness:    Edward [name] is a 75 y.o. male with a hx of aortic stenosis, hypertension, hyperlipidemia, myasthenia gravis, OSA, DVT who is referred by Dr. Kateri PlummerMorrow for evaluation of dyspnea.  Most recent echocardiogram 07/27/2017 showed LVEF 60 to 65%, grade 1 diastolic dysfunction, functionally bicuspid aortic valve with mild to moderate aortic stenosis and mild aortic regurgitation, mild MR, mild TR.  Today, he is accompanied by his wife, who also provides some history. His main complaint is a burning sensation in his chest radiating to right arm. The chest discomfort occurs randomly, either at rest or exertion, and he would not describe it as "pain." He notes it did occur during times of emotional stress as well.  For exercise, he walks outside and gardens. [employer] work is the most strenuous activity for him, and he is sometimes short of breath during warmer weather. Also, he is lightheaded at times after standing up. He reports minor LE edema in his right leg, and he endorses a history of two thrombi behind his right knee. He has never been a smoker. In his family, his father died of a possible heart attack at 74 yo. He uses his CPAP every night. He denies any palpitations, headaches, or syncope. Also has no orthopnea or PND.   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 by echo 07/2017   Chronic kidney disease    kidney stones   Colon polyps    Diplopia 10/15/2015   Diverticulosis    Erectile dysfunction    GERD (gastroesophageal reflux disease)    with Barrett's esophagus   Gout    Hearing loss     hearing aides   HTN (hypertension)    Hypercholesteremia    Migraine headache    Myasthenia gravis (HCC)    Obesity    OSA (obstructive sleep apnea)    AHI 43/hr now on CPAP at 10cm H2O   Peripheral neuropathy 01/14/2019   Right foot drop 12/03/2018    Past Surgical History:  Procedure Laterality Date   laser assisted uvuloplasty      Current Medications: Current Meds  Medication Sig   allopurinol (ZYLOPRIM) 300 MG tablet Take 300 mg by mouth daily.   amLODipine-benazepril (LOTREL) 5-10 MG per capsule Take 1 capsule by mouth daily.   atorvastatin (LIPITOR) 20 MG tablet Take 20 mg by mouth daily.   Biotin 1 MG CAPS Take by mouth.   Biotin 1000 MCG tablet Take 2,500 mcg by mouth daily.    cetirizine (ZYRTEC) 10 MG tablet Take 10 mg by mouth daily.   fish oil-omega-3 fatty acids 1000 MG capsule Take 1 g by mouth 2 (two) times daily.    lidocaine (LIDODERM) 5 % Place 1 patch onto the skin daily. Remove & Discard patch within 12 hours or as directed by MD   losartan (COZAAR) 50 MG tablet Take 1 tablet by mouth daily.   Multiple Vitamin (MULTIVITAMIN) capsule Take 1 capsule by mouth daily.   Omega-3 Fatty Acids (FISH OIL OMEGA-3) 1000 MG CAPS  omeprazole (PRILOSEC OTC) 20 MG tablet    omeprazole (PRILOSEC) 20 MG capsule Take 20 mg by mouth daily.   predniSONE (DELTASONE) 1 MG tablet Take 1 tablet (1 mg total) by mouth daily.   predniSONE (DELTASONE) 5 MG tablet TAKE 1 TABLET BY MOUTH EVERY DAY   pyridostigmine (MESTINON) 60 MG tablet TAKE 1/2 TABLET 3 TIMES DAILY   XARELTO 20 MG TABS tablet      Allergies:   Patient has no known allergies.   Social History   Socioeconomic History   Marital status: Married    Spouse name: [name]   Number of children: 2   Years of education: College   Highest education level: Not on file  Occupational History   Occupation: Retired  Tobacco Use   Smoking status: Never Smoker   Smokeless tobacco: Never Used  Building services engineer Use:  Never used  Substance and Sexual Activity   Alcohol use: Yes    Alcohol/week: 0.0 standard drinks    Comment: rare wine   Drug use: No   Sexual activity: Not on file  Other Topics Concern   Not on file  Social History Narrative   Lives home w/ his wife   Right-handed    Drinks about 3 cups of caffeine per day   Social Determinants of Health   Financial Resource Strain: Not on file  Food Insecurity: Not on file  Transportation Needs: Not on file  Physical Activity: Not on file  Stress: Not on file  Social Connections: Not on file     Family History: The patient's family history includes Anemia in his brother; Cancer in his brother; Heart attack in his father; Heart disease in his brother and father; Hypertension in his brother, brother, mother, and sister.  ROS:   Please see the history of present illness.    (+) Chest discomfort, burning sensation (+) Right UE pain (+) Shortness of breath (+) Lightheadedness (+) LE edema (+) Stress All other systems reviewed and are negative.  EKGs/Labs/Other Studies Reviewed:    The following studies were reviewed today:  [place] LE Venous 08/01/2017: Final Interpretation:  Right: There is evidence of acute DVT in the Posterior Tibial veins.  Ultrasound is unable to distinguish whether obstruction in the Femoral  vein, and Popliteal vein is acute or chronic.   TTE 07/27/2017: - Left ventricle: The cavity size was normal. There was mild    concentric hypertrophy. Systolic function was normal. The    estimated ejection fraction was in the range of 60% to 65%. Wall    motion was normal; there were no regional wall motion    abnormalities. Doppler parameters are consistent with abnormal    left ventricular relaxation (grade 1 diastolic dysfunction).  - Aortic valve: Functionally bicuspid; moderately thickened,    moderately calcified leaflets. There was mild to moderate    stenosis. There was mild regurgitation. Peak gradient (S): 25 mm     Hg. Valve area (VTI): 1.24 cm^2. Valve area (Vmax): 1.42 cm^2.    Valve area (Vmean): 1.37 cm^2.  - Mitral valve: There was mild regurgitation.  - Left atrium: The atrium was at the upper limits of normal in    size.  - Right ventricle: The cavity size was normal. Wall thickness was    normal. Systolic function was normal.  - Tricuspid valve: There was mild regurgitation.  - Pulmonary arteries: Systolic pressure was within the normal    range.   US Venous LE Doppler  06/27/2016: 1. Positive for acute to subacute deep venous thrombosis extending from the popliteal vein into the common femoral vein. The common femoral and femoral venous thrombus is nonocclusive while the popliteal venous thrombus is occlusive.  Echo 08/31/2015: - Left ventricle: The cavity size was normal. Wall thickness was    normal. Systolic function was vigorous. The estimated ejection    fraction was in the range of 65% to 70%. Wall motion was normal;    there were no regional wall motion abnormalities. Doppler    parameters are consistent with abnormal left ventricular    relaxation (grade 1 diastolic dysfunction).  - Aortic valve: There was mild regurgitation. Mean gradient (S): 9    mm Hg. Peak gradient (S): 15 mm Hg. Valve area (VTI): 1.98 cm^2.    Valve area (Vmax): 2.07 cm^2. Valve area (Vmean): 1.87 cm^2.  - Pulmonary arteries: Systolic pressure was mildly increased. PA    peak pressure: 32 mm Hg (S).   EKG:   09/29/2020: NSR, PACs, PVCs, nonspecific T wave flattening, rate 84 bpm  Recent Labs: 12/12/2019: ALT 25; BUN 26; Creatinine, Ser 1.26; Hemoglobin 15.0; Platelets 208; Potassium 4.8; Sodium 141  Recent Lipid Panel No results found for: CHOL, TRIG, HDL, CHOLHDL, VLDL, LDLCALC, LDLDIRECT  Physical Exam:    VS:  BP 132/68   Pulse 84   Ht 5\' 9"  (1.753 m)   Wt 228 lb (103.4 kg)   SpO2 96%   BMI 33.67 kg/m     Wt Readings from Last 3 Encounters:  09/29/20 228 lb (103.4 kg)  06/22/20 219 lb (99.3  kg)  12/12/19 224 lb (101.6 kg)     GEN: Well nourished, well developed in no acute distress HEENT: Normal NECK: No JVD; No carotid bruits LYMPHATICS: No lymphadenopathy CARDIAC: RRR, 3/6 systolic murmur loudest at the RUSB, no rubs, no gallops RESPIRATORY:  Clear to auscultation without rales, wheezing or rhonchi  ABDOMEN: Soft, non-tender, non-distended MUSCULOSKELETAL:  Trace bilateral edema (R>L); No deformity  SKIN: Warm and dry NEUROLOGIC:  Alert and oriented x 3 PSYCHIATRIC:  Normal affect   ASSESSMENT:    1. Aortic valve stenosis, etiology of cardiac valve disease unspecified   2. Chest pain of uncertain etiology   3. DOE (dyspnea on exertion)   4. Hyperlipidemia, unspecified hyperlipidemia type   5. Essential hypertension    PLAN:    Chest pain/DOE: Reports atypical chest pain, in addition to dyspnea on exertion.  Had mild to moderate aortic stenosis on echocardiogram 3 years ago.  Significant murmur on exam, concerned that aortic stenosis has progressed.  Will repeat echocardiogram.  If aortic stenosis is severe, will plan LHC/RHC and referral to valve clinic.  If aortic stenosis is not severe, then AS is not the cause of his symptoms and will need ischemia evaluation, would plan for [facility] as not a good coronary CTA candidate given frequent ectopy  Aortic stenosis: Mild to moderate on echo 07/2017.  Will repeat echocardiogram.  Hyperlipidemia: On atorvastatin 20 mg daily LDL 94 on 04/15/2020.  Hypertension: On amlodipine- benazepril 5-10 mg daily.  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 Greek and he agrees to proceed.   Medication Adjustments/Labs and Tests Ordered: Current medicines are reviewed at length with the patient today.  Concerns regarding medicines are outlined above.  Orders Placed This Encounter  Procedures   EKG 12-Lead   ECHOCARDIOGRAM COMPLETE   No orders of the defined types were placed in this encounter.   Patient Instructions  Medication Instructions:  Your physician recommends that you continue on your current medications as directed. Please refer to the Current Medication list given to you today.  *If you need a refill on your cardiac medications before your next appointment, please call your pharmacy*  Testing/Procedures: Your physician has requested that you have an echocardiogram (in 1-2 weeks). Echocardiography is a painless test that uses sound waves to create images of your heart. It provides your doctor with information about the size and shape of your heart and how well your heart's chambers and valves are working. This procedure takes approximately one hour. There are no restrictions for this procedure.  This will be done at our [facility] location:  [address]  Follow-Up: At [facility], you and your health needs are our priority.  As part of our continuing mission to provide you with exceptional heart care, we have created designated Provider Care Teams.  These Care Teams include your primary Cardiologist (physician) and Advanced Practice Providers (APPs -  Physician Assistants and Nurse Practitioners) who all work together to provide you with the care you need, when you need it.  We recommend signing up for the patient portal called "MyChart".  Sign up information is provided on this After Visit Summary.   MyChart is used to connect with patients for Virtual Visits (Telemedicine).  Patients are able to view lab/test results, encounter notes, upcoming appointments, etc.  Non-urgent messages can be sent to your provider as well.   To learn more about what you can do with MyChart, go to [email or web address].    Your next appointment:    Monday, July 18th at 9:20 AM with Dr. Nathanial Rancher Stumpf,acting as a scribe for Little Ishikawa, MD.,have documented all relevant documentation on the behalf of Little Ishikawa, MD,as directed by  Little Ishikawa, MD while in the presence of Little Ishikawa, MD.  I, Little Ishikawa, MD, have reviewed all documentation for this visit. The documentation on 09/29/20 for the exam, diagnosis, procedures, and orders are all accurate and complete.   Signed, Little Ishikawa, MD  09/29/2020 3:42 PM    [facility]

## 2020-09-29 NOTE — Progress Notes (Deleted)
Cardiology Office Note:    Date:  09/29/2020   ID:  Edward [name], DOB 1946/03/08, MRN [id number]  PCP:  Farris Has, MD  Cardiologist:  None  Electrophysiologist:  None   Referring MD: Farris Has, MD   No chief complaint on file. ***  History of Present Illness:    Edward [name] is a 75 y.o. male with a hx of aortic stenosis, hypertension, hyperlipidemia, myasthenia gravis, OSA, DVT who is referred by Dr. Kateri Plummer for evaluation of dyspnea.  Most recent echocardiogram 07/27/2017 showed LVEF 60 to 65%, grade 1 diastolic dysfunction, functionally bicuspid aortic valve with mild to moderate aortic stenosis and mild aortic regurgitation, mild MR, mild TR.  Past Medical History:  Diagnosis Date  . Allergic rhinitis   . 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 Chronic kidney disease    kidney stones  . Colon polyps   . Diplopia 10/15/2015  . Diverticulosis   . Erectile dysfunction   . GERD (gastroesophageal reflux disease)    with Barrett's esophagus  . Gout   . Hearing loss    hearing aides  . HTN (hypertension)   . Hypercholesteremia   . Migraine headache   . Myasthenia gravis (HCC)   . Obesity   . OSA (obstructive sleep apnea)    AHI 43/hr now on CPAP at 10cm H2O  . Peripheral neuropathy 01/14/2019  . Right foot drop 12/03/2018    Past Surgical History:  Procedure Laterality Date  . laser assisted uvuloplasty      Current Medications: No outpatient medications have been marked as taking for the 09/29/20 encounter (Appointment) with Little Ishikawa, MD.     Allergies:   Patient has no known allergies.   Social History   Socioeconomic History  . Marital status: Married    Spouse name: [name]  . Number of children: 2  . Years of education: College  . Highest education level: Not on file  Occupational History  . Occupation: Retired  Tobacco Use  . Smoking status: Never Smoker  .  Smokeless tobacco: Never Used  Vaping Use  . Vaping Use: Never used  Substance and Sexual Activity  . Alcohol use: Yes    Alcohol/week: 0.0 standard drinks    Comment: rare wine  . Drug use: No  . Sexual activity: Not on file  Other Topics Concern  . Not on file  Social History Narrative   Lives home w/ his wife   Right-handed    Drinks about 3 cups of caffeine per day   Social Determinants of Health   Financial Resource Strain: Not on file  Food Insecurity: Not on file  Transportation Needs: Not on file  Physical Activity: Not on file  Stress: Not on file  Social Connections: Not on file     Family History: The patient's ***family history includes Anemia in his brother; Cancer in his brother; Heart attack in his father; Heart disease in his brother and father; Hypertension in his brother, brother, mother, and sister.  ROS:   Please see the history of present illness.    *** All other systems reviewed and are negative.  EKGs/Labs/Other Studies Reviewed:    The following studies were reviewed today: ***  EKG:  EKG is *** ordered today.  The ekg ordered today demonstrates ***  Recent Labs: 12/12/2019: ALT 25; BUN 26; Creatinine, Ser 1.26; Hemoglobin 15.0; Platelets 208; Potassium 4.8; Sodium  141  Recent Lipid Panel No results found for: CHOL, TRIG, HDL, CHOLHDL, VLDL, LDLCALC, LDLDIRECT  Physical Exam:    VS:  There were no vitals taken for this visit.    Wt Readings from Last 3 Encounters:  06/22/20 219 lb (99.3 kg)  12/12/19 224 lb (101.6 kg)  06/05/19 222 lb 6.4 oz (100.9 kg)     GEN: *** Well nourished, well developed in no acute distress HEENT: Normal NECK: No JVD; No carotid bruits LYMPHATICS: No lymphadenopathy CARDIAC: ***RRR, no murmurs, rubs, gallops RESPIRATORY:  Clear to auscultation without rales, wheezing or rhonchi  ABDOMEN: Soft, non-tender, non-distended MUSCULOSKELETAL:  No edema; No deformity  SKIN: Warm and dry NEUROLOGIC:  Alert and  oriented x 3 PSYCHIATRIC:  Normal affect   ASSESSMENT:    No diagnosis found. PLAN:    Aortic stenosis: Mild to moderate on echo 07/2017.  Will repeat echocardiogram.  Hyperlipidemia: On atorvastatin 20 mg daily  Hypertension: On amlodipine- benazepril 5-10 mg daily  DVT: On Xarelto  RTC in***  Medication Adjustments/Labs and Tests Ordered: Current medicines are reviewed at length with the patient today.  Concerns regarding medicines are outlined above.  No orders of the defined types were placed in this encounter.  No orders of the defined types were placed in this encounter.   There are no Patient Instructions on file for this visit.   Signed, Little Ishikawa, MD  09/29/2020 7:02 AM    [facility]

## 2020-10-13 DIAGNOSIS — E782 Mixed hyperlipidemia: Secondary | ICD-10-CM | POA: Diagnosis not present

## 2020-10-13 DIAGNOSIS — G629 Polyneuropathy, unspecified: Secondary | ICD-10-CM | POA: Diagnosis not present

## 2020-10-13 DIAGNOSIS — I10 Essential (primary) hypertension: Secondary | ICD-10-CM | POA: Diagnosis not present

## 2020-10-13 DIAGNOSIS — I825Z9 Chronic embolism and thrombosis of unspecified deep veins of unspecified distal lower extremity: Secondary | ICD-10-CM | POA: Diagnosis not present

## 2020-10-13 DIAGNOSIS — K219 Gastro-esophageal reflux disease without esophagitis: Secondary | ICD-10-CM | POA: Diagnosis not present

## 2020-10-13 DIAGNOSIS — R011 Cardiac murmur, unspecified: Secondary | ICD-10-CM | POA: Diagnosis not present

## 2020-10-13 DIAGNOSIS — R7303 Prediabetes: Secondary | ICD-10-CM | POA: Diagnosis not present

## 2020-10-13 DIAGNOSIS — K2270 Barrett's esophagus without dysplasia: Secondary | ICD-10-CM | POA: Diagnosis not present

## 2020-10-13 DIAGNOSIS — G7000 Myasthenia gravis without (acute) exacerbation: Secondary | ICD-10-CM | POA: Diagnosis not present

## 2020-10-14 ENCOUNTER — Other Ambulatory Visit: Payer: Self-pay

## 2020-10-14 ENCOUNTER — Ambulatory Visit (HOSPITAL_COMMUNITY)
Admission: RE | Admit: 2020-10-14 | Discharge: 2020-10-14 | Disposition: A | Discharge status: Home / Self Care | Payer: Medicare PPO | Source: Ambulatory Visit | Attending: Cardiology | Admitting: Cardiology

## 2020-10-14 DIAGNOSIS — I10 Essential (primary) hypertension: Secondary | ICD-10-CM | POA: Insufficient documentation

## 2020-10-14 DIAGNOSIS — E785 Hyperlipidemia, unspecified: Secondary | ICD-10-CM | POA: Insufficient documentation

## 2020-10-14 DIAGNOSIS — I083 Combined rheumatic disorders of mitral, aortic and tricuspid valves: Secondary | ICD-10-CM | POA: Insufficient documentation

## 2020-10-14 DIAGNOSIS — G4730 Sleep apnea, unspecified: Secondary | ICD-10-CM | POA: Insufficient documentation

## 2020-10-14 DIAGNOSIS — K449 Diaphragmatic hernia without obstruction or gangrene: Secondary | ICD-10-CM | POA: Diagnosis not present

## 2020-10-14 DIAGNOSIS — K2270 Barrett's esophagus without dysplasia: Secondary | ICD-10-CM | POA: Diagnosis not present

## 2020-10-14 DIAGNOSIS — K2950 Unspecified chronic gastritis without bleeding: Secondary | ICD-10-CM | POA: Diagnosis not present

## 2020-10-14 DIAGNOSIS — K209 Esophagitis, unspecified without bleeding: Secondary | ICD-10-CM | POA: Diagnosis not present

## 2020-10-14 DIAGNOSIS — K219 Gastro-esophageal reflux disease without esophagitis: Secondary | ICD-10-CM | POA: Diagnosis not present

## 2020-10-14 DIAGNOSIS — I350 Nonrheumatic aortic (valve) stenosis: Secondary | ICD-10-CM

## 2020-10-14 LAB — ECHOCARDIOGRAM COMPLETE
AR max vel: 0.98 cm2
AV Area VTI: 1.02 cm2
AV Area mean vel: 0.89 cm2
AV Mean grad: 12 mmHg
AV Peak grad: 20.6 mmHg
Ao pk vel: 2.27 m/s
Area-P 1/2: 3.12 cm2
P 1/2 time: 511 msec
S' Lateral: 3.2 cm

## 2020-10-14 NOTE — Progress Notes (Signed)
  Echocardiogram 2D Echocardiogram has been performed.  Delcie Roch 10/14/2020, 11:46 AM

## 2020-10-16 ENCOUNTER — Other Ambulatory Visit: Payer: Self-pay | Admitting: *Deleted

## 2020-10-16 DIAGNOSIS — R079 Chest pain, unspecified: Secondary | ICD-10-CM

## 2020-10-16 DIAGNOSIS — R0600 Dyspnea, unspecified: Secondary | ICD-10-CM

## 2020-10-16 DIAGNOSIS — R0609 Other forms of dyspnea: Secondary | ICD-10-CM

## 2020-10-20 DIAGNOSIS — K209 Esophagitis, unspecified without bleeding: Secondary | ICD-10-CM | POA: Diagnosis not present

## 2020-10-21 DIAGNOSIS — G4733 Obstructive sleep apnea (adult) (pediatric): Secondary | ICD-10-CM | POA: Diagnosis not present

## 2020-10-21 NOTE — Addendum Note (Signed)
Addended by: Epifanio Lesches on: 10/21/2020 02:17 PM   Modules accepted: Orders

## 2020-10-21 NOTE — Addendum Note (Signed)
Addended by: Johney Frame A on: 10/21/2020 02:15 PM   Modules accepted: Orders

## 2020-11-04 ENCOUNTER — Telehealth: Payer: Self-pay

## 2020-11-04 NOTE — Telephone Encounter (Signed)
Detailed instructions left on the patient's answering machine. Asked to call back with any questions. Edward [name] EMTP 

## 2020-11-05 ENCOUNTER — Other Ambulatory Visit: Payer: Self-pay

## 2020-11-05 ENCOUNTER — Ambulatory Visit (HOSPITAL_COMMUNITY): Payer: Medicare PPO | Attending: Cardiology

## 2020-11-05 DIAGNOSIS — R079 Chest pain, unspecified: Secondary | ICD-10-CM | POA: Diagnosis not present

## 2020-11-05 DIAGNOSIS — R0600 Dyspnea, unspecified: Secondary | ICD-10-CM | POA: Diagnosis not present

## 2020-11-05 DIAGNOSIS — R0609 Other forms of dyspnea: Secondary | ICD-10-CM

## 2020-11-05 LAB — MYOCARDIAL PERFUSION IMAGING
LV dias vol: 99 mL (ref 62–150)
LV sys vol: 48 mL
Peak HR: 75 {beats}/min
Rest HR: 63 {beats}/min
SDS: 10
SRS: 1
SSS: 11
TID: 0.98

## 2020-11-05 MED ORDER — TECHNETIUM TC 99M TETROFOSMIN IV KIT
9.8000 | PACK | Freq: Once | INTRAVENOUS | Status: AC | PRN
Start: 2020-11-05 — End: 2020-11-05
  Administered 2020-11-05: 9.8 via INTRAVENOUS
  Filled 2020-11-05: qty 10

## 2020-11-05 MED ORDER — REGADENOSON 0.4 MG/5ML IV SOLN
0.4000 mg | Freq: Once | INTRAVENOUS | Status: AC
  Administered 2020-11-05: 0.4 mg via INTRAVENOUS

## 2020-11-05 MED ORDER — TECHNETIUM TC 99M TETROFOSMIN IV KIT
32.2000 | PACK | Freq: Once | INTRAVENOUS | Status: AC | PRN
Start: 2020-11-05 — End: 2020-11-05
  Administered 2020-11-05: 32.2 via INTRAVENOUS
  Filled 2020-11-05: qty 33

## 2020-11-06 NOTE — Progress Notes (Signed)
Cardiology Office Note:    Date:  11/09/2020   ID:  Edward [name], DOB March 31, 1946, MRN [id number]  PCP:  Farris Has, MD  Cardiologist:  None  Electrophysiologist:  None   Referring MD: Farris Has, MD   Chief Complaint  Patient presents with   Aortic Stenosis     History of Present Illness:    Edward [name] is a 75 y.o. male with a hx of aortic stenosis, hypertension, hyperlipidemia, myasthenia gravis, OSA, DVT who presents for follow-up.  He was referred by Dr. Kateri Plummer for evaluation of dyspnea and chest pain, initially seen on 09/29/2020.  Echocardiogram 07/27/2017 showed LVEF 60 to 65%, grade 1 diastolic dysfunction, functionally bicuspid aortic valve with mild to moderate aortic stenosis and mild aortic regurgitation, mild MR, mild TR. Echocardiogram on 10/14/2020 showed normal biventricular function, mild LVH, mild to moderate aortic stenosis, mild aortic regurgitation, mild mitral regurgitation.  Lexiscan Myoview on 11/05/2020 showed fixed inferior defect likely representing artifact, no ischemia, EF 52%.  Today, he is accompanied by his wife, who also provides some history. His main complaint is a burning sensation in his chest radiating to right arm. The chest discomfort occurs randomly, either at rest or exertion, and he would not describe it as "pain." He notes it did occur during times of emotional stress as well.  For exercise, he walks outside and gardens. [employer] work is the most strenuous activity for him, and he is sometimes short of breath during warmer weather. Also, he is lightheaded at times after standing up. He reports minor LE edema in his right leg, and he endorses a history of two thrombi behind his right knee. He has never been a smoker. In his family, his father died of a possible heart attack at 74 yo. He uses his CPAP every night. He denies any palpitations, headaches, or syncope. Also has no orthopnea or PND.  Since last clinic visit, he reports doing okay. Patient has  occasional heart burn, shortness of breath and lightheadedness when doing daily activites. He also experiences occasional contusions on his arms and blood in stool, which he attributes to his hemorrhoids.   He includes when he walks to his mailbox, his heart burn and shoulder pain can occur. He believes the pains come from stress. Patient says after his stress test, he went to walk up a hill and experienced extreme fatigued, lightheadedness and exhaustion. Denies palpitations or syncope. Patient's wife says he always uses his CAP.     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 by echo 07/2017   Chronic kidney disease    kidney stones   Colon polyps    Diplopia 10/15/2015   Diverticulosis    Erectile dysfunction    GERD (gastroesophageal reflux disease)    with Barrett's esophagus   Gout    Hearing loss    hearing aides   HTN (hypertension)    Hypercholesteremia    Migraine headache    Myasthenia gravis (HCC)    Obesity    OSA (obstructive sleep apnea)    AHI 43/hr now on CPAP at 10cm H2O   Peripheral neuropathy 01/14/2019   Right foot drop 12/03/2018    Past Surgical History:  Procedure Laterality Date   laser assisted uvuloplasty      Current Medications: Current Meds  Medication Sig   allopurinol (ZYLOPRIM) 300 MG tablet Take  300 mg by mouth daily.   atorvastatin (LIPITOR) 20 MG tablet Take 20 mg by mouth daily.   Biotin 1000 MCG tablet Take 2,500 mcg by mouth daily.    cetirizine (ZYRTEC) 10 MG tablet Take 10 mg by mouth daily.   lidocaine (LIDODERM) 5 % Place 1 patch onto the skin daily. Remove & Discard patch within 12 hours or as directed by MD   losartan (COZAAR) 50 MG tablet Take 1 tablet by mouth daily.   Multiple Vitamin (MULTIVITAMIN) capsule Take 1 capsule by mouth daily.   Omega-3 Fatty Acids (FISH OIL OMEGA-3) 1000 MG CAPS    omeprazole (PRILOSEC) 20  MG capsule Take 20 mg by mouth daily.   predniSONE (DELTASONE) 1 MG tablet Take 1 tablet (1 mg total) by mouth daily.   predniSONE (DELTASONE) 5 MG tablet TAKE 1 TABLET BY MOUTH EVERY DAY   pyridostigmine (MESTINON) 60 MG tablet TAKE 1/2 TABLET 3 TIMES DAILY   XARELTO 20 MG TABS tablet    [DISCONTINUED] amLODipine-benazepril (LOTREL) 5-10 MG per capsule Take 1 capsule by mouth daily.     Allergies:   Patient has no known allergies.   Social History   Socioeconomic History   Marital status: Married    Spouse name: [name]   Number of children: 2   Years of education: [employer]   Highest education level: Not on file  Occupational History   Occupation: Retired  Tobacco Use   Smoking status: Never   Smokeless tobacco: Never  Vaping Use   Vaping Use: Never used  Substance and Sexual Activity   Alcohol use: Yes    Alcohol/week: 0.0 standard drinks    Comment: rare wine   Drug use: No   Sexual activity: Not on file  Other Topics Concern   Not on file  Social History Narrative   Lives home w/ his wife   Right-handed    Drinks about 3 cups of caffeine per day   Social Determinants of Health   Financial Resource Strain: Not on file  Food Insecurity: Not on file  Transportation Needs: Not on file  Physical Activity: Not on file  Stress: Not on file  Social Connections: Not on file     Family History: The patient's family history includes Anemia in his brother; Cancer in his brother; Heart attack in his father; Heart disease in his brother and father; Hypertension in his brother, brother, mother, and sister.  ROS:   Please see the history of present illness.   (+) Chest discomfort, burning sensation (+) Cough (+) Shortness of breath (+) Lightheadedness (+) fatigued (+)exhaustion (+) Stress All other systems reviewed and are negative.  EKGs/Labs/Other Studies Reviewed:    The following studies were reviewed today:  US LE Venous 08/01/2017: Final Interpretation:   Right: There is evidence of acute DVT in the Posterior Tibial veins.  Ultrasound is unable to distinguish whether obstruction in the Femoral  vein, and Popliteal vein is acute or chronic.   TTE 07/27/2017: - Left ventricle: The cavity size was normal. There was mild    concentric hypertrophy. Systolic function was normal. The    estimated ejection fraction was in the range of 60% to 65%. Wall    motion was normal; there were no regional wall motion    abnormalities. Doppler parameters are consistent with abnormal    left ventricular relaxation (grade 1 diastolic dysfunction).  - Aortic valve: Functionally bicuspid; moderately thickened,    moderately calcified leaflets. There was mild to moderate  stenosis. There was mild regurgitation. Peak gradient (S): 25 mm    Hg. Valve area (VTI): 1.24 cm^2. Valve area (Vmax): 1.42 cm^2.    Valve area (Vmean): 1.37 cm^2.  - Mitral valve: There was mild regurgitation.  - Left atrium: The atrium was at the upper limits of normal in    size.  - Right ventricle: The cavity size was normal. Wall thickness was    normal. Systolic function was normal.  - Tricuspid valve: There was mild regurgitation.  - Pulmonary arteries: Systolic pressure was within the normal    range.   US Venous LE Doppler 06/27/2016: 1. Positive for acute to subacute deep venous thrombosis extending from the popliteal vein into the common femoral vein. The common femoral and femoral venous thrombus is nonocclusive while the popliteal venous thrombus is occlusive.  Echo 08/31/2015: - Left ventricle: The cavity size was normal. Wall thickness was    normal. Systolic function was vigorous. The estimated ejection    fraction was in the range of 65% to 70%. Wall motion was normal;    there were no regional wall motion abnormalities. Doppler    parameters are consistent with abnormal left ventricular    relaxation (grade 1 diastolic dysfunction).  - Aortic valve: There was mild  regurgitation. Mean gradient (S): 9    mm Hg. Peak gradient (S): 15 mm Hg. Valve area (VTI): 1.98 cm^2.    Valve area (Vmax): 2.07 cm^2. Valve area (Vmean): 1.87 cm^2.  - Pulmonary arteries: Systolic pressure was mildly increased. PA    peak pressure: 32 mm Hg (S).   EKG:  11/09/2020: no ekg ordered today.  09/29/2020: NSR, PACs, PVCs, nonspecific T wave flattening, rate 84 bpm  Recent Labs: 11/09/2020: ALT 19; BUN 25; Creatinine, Ser 1.20; Hemoglobin 14.1; Platelets 219; Potassium 4.8; Sodium 140  Recent Lipid Panel    Component Value Date/Time   CHOL 163 11/09/2020 1028   TRIG 170 (H) 11/09/2020 1028   HDL 49 11/09/2020 1028   CHOLHDL 3.3 11/09/2020 1028   LDLCALC 85 11/09/2020 1028    Physical Exam:    VS:  BP 133/71   Pulse (!) 53   Ht 5\' 9"  (1.753 m)   Wt 223 lb 9.6 oz (101.4 kg)   SpO2 92%   BMI 33.02 kg/m     Wt Readings from Last 3 Encounters:  11/09/20 223 lb 9.6 oz (101.4 kg)  11/05/20 228 lb (103.4 kg)  09/29/20 228 lb (103.4 kg)     GEN: Well nourished, well developed in no acute distress HEENT: Normal NECK: No JVD; No carotid bruits LYMPHATICS: No lymphadenopathy CARDIAC: RRR, no murmurs, no rubs, no gallops RESPIRATORY:  Clear to auscultation without rales, wheezing or rhonchi  ABDOMEN: Soft, non-tender, non-distended MUSCULOSKELETAL:  Trace bilateral edema (R>L); No deformity  SKIN: Warm and dry NEUROLOGIC:  Alert and oriented x 3 PSYCHIATRIC:  Normal affect   ASSESSMENT:    1. Chest pain of uncertain etiology   2. DOE (dyspnea on exertion)   3. Aortic valve stenosis, etiology of cardiac valve disease unspecified   4. Essential hypertension   5. Hyperlipidemia, unspecified hyperlipidemia type    PLAN:    Chest pain/DOE/fatigue: Reports atypical chest pain, in addition to dyspnea on exertion and fatigue.  Lexiscan Myoview on 11/05/2020 showed fixed inferior defect likely representing artifact, no ischemia, EF 52%.  Echocardiogram on 10/14/2020  showed normal biventricular function, mild LVH, mild to moderate aortic stenosis, mild aortic regurgitation, mild mitral regurgitation -Continues  to have significant fatigue, will check CBC as he is on anticoagulation  Aortic stenosis: Mild to moderate on echo 07/2017.  Echocardiogram on 10/14/2020 showed normal biventricular function, mild LVH, mild to moderate aortic stenosis, mild aortic regurgitation, mild mitral regurgitation.  We will monitor  Hyperlipidemia: On atorvastatin 20 mg daily, LDL 94 on 04/15/2020.  Check calcium score to guide how aggressive to be in lowering cholesterol.  We will recheck lipid panel.  Hypertension: Has breath amlodipine- benazepril 5-10 mg daily and losartan 50 mg daily both on medication list.  He reports he was recently had a medication discontinued due to cough.  Suspect amlodipine-benazepril was changed to amlodipine only and losartan added.  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  Concerns regarding medicines are outlined above.  Orders Placed This Encounter  Procedures   CT CARDIAC SCORING (SELF PAY ONLY)   CBC   Comprehensive metabolic panel   Lipid panel   No orders of the defined types were placed in this encounter.   Patient Instructions  Medication Instructions:  Please call [phone] to verify medications once you get home.   *If you need a refill on your cardiac medications before your next appointment, please call your pharmacy*   Lab Work: CBC, CMET, LIPID today   If you have labs (blood work) drawn today and your tests are completely normal, you will receive your results only by: MyChart Message (if you have MyChart) OR A paper copy in the mail If you have any lab test that is abnormal or we need to change  your treatment, we will call you to review the results.   Testing/Procedures: CALCIUM SCORE   Follow-Up: At [facility], you and your health needs are our priority.  As part of our continuing mission to provide you with exceptional heart care, we have created designated Provider Care Teams.  These Care Teams include your primary Cardiologist (physician) and Advanced Practice Providers (APPs -  Physician Assistants and Nurse Practitioners) who all work together to provide you with the care you need, when you need it.  We recommend signing up for the patient portal called "MyChart".  Sign up information is provided on this After Visit Summary.  MyChart is used to connect with patients for Virtual Visits (Telemedicine).  Patients are able to view lab/test results, encounter notes, upcoming appointments, etc.  Non-urgent messages can be sent to your provider as well.   To learn more about what you can do with MyChart, go to [email or web address].    Your next appointment:   3 month(s)  The format for your next appointment:   In Person  Provider:   Epifanio Lesches, MD       Liz Beach as a scribe for Little Ishikawa, MD.,have documented all relevant documentation on the behalf of Little Ishikawa, MD,as directed by  Little Ishikawa, MD while in the presence of Little Ishikawa, MD.  I, Little Ishikawa, MD, have reviewed all documentation for this visit. The documentation on 11/09/20 for the exam, diagnosis, procedures, and orders are all accurate and complete.    Signed, Little Ishikawa, MD  11/09/2020 11:29 PM    [facility]

## 2020-11-09 ENCOUNTER — Telehealth: Payer: Self-pay | Admitting: Cardiology

## 2020-11-09 ENCOUNTER — Ambulatory Visit (INDEPENDENT_AMBULATORY_CARE_PROVIDER_SITE_OTHER): Payer: Medicare PPO | Admitting: Cardiology

## 2020-11-09 ENCOUNTER — Other Ambulatory Visit: Payer: Self-pay

## 2020-11-09 ENCOUNTER — Encounter: Payer: Self-pay | Admitting: Cardiology

## 2020-11-09 VITALS — BP 133/71 | HR 53 | Ht 69.0 in | Wt 223.6 lb

## 2020-11-09 DIAGNOSIS — R079 Chest pain, unspecified: Secondary | ICD-10-CM

## 2020-11-09 DIAGNOSIS — I10 Essential (primary) hypertension: Secondary | ICD-10-CM

## 2020-11-09 DIAGNOSIS — E785 Hyperlipidemia, unspecified: Secondary | ICD-10-CM

## 2020-11-09 DIAGNOSIS — I350 Nonrheumatic aortic (valve) stenosis: Secondary | ICD-10-CM | POA: Diagnosis not present

## 2020-11-09 DIAGNOSIS — R0609 Other forms of dyspnea: Secondary | ICD-10-CM

## 2020-11-09 DIAGNOSIS — R0600 Dyspnea, unspecified: Secondary | ICD-10-CM

## 2020-11-09 LAB — CBC
Hematocrit: 42.7 % (ref 37.5–51.0)
Hemoglobin: 14.1 g/dL (ref 13.0–17.7)
MCH: 29.1 pg (ref 26.6–33.0)
MCHC: 33 g/dL (ref 31.5–35.7)
MCV: 88 fL (ref 79–97)
Platelets: 219 10*3/uL (ref 150–450)
RBC: 4.85 x10E6/uL (ref 4.14–5.80)
RDW: 13.8 % (ref 11.6–15.4)
WBC: 9.6 10*3/uL (ref 3.4–10.8)

## 2020-11-09 LAB — COMPREHENSIVE METABOLIC PANEL
ALT: 19 IU/L (ref 0–44)
AST: 30 IU/L (ref 0–40)
Albumin/Globulin Ratio: 1.7 (ref 1.2–2.2)
Albumin: 4.3 g/dL (ref 3.7–4.7)
Alkaline Phosphatase: 51 IU/L (ref 44–121)
BUN/Creatinine Ratio: 21 (ref 10–24)
BUN: 25 mg/dL (ref 8–27)
Bilirubin Total: 0.5 mg/dL (ref 0.0–1.2)
CO2: 23 mmol/L (ref 20–29)
Calcium: 9.6 mg/dL (ref 8.6–10.2)
Chloride: 102 mmol/L (ref 96–106)
Creatinine, Ser: 1.2 mg/dL (ref 0.76–1.27)
Globulin, Total: 2.6 g/dL (ref 1.5–4.5)
Glucose: 111 mg/dL — ABNORMAL HIGH (ref 65–99)
Potassium: 4.8 mmol/L (ref 3.5–5.2)
Sodium: 140 mmol/L (ref 134–144)
Total Protein: 6.9 g/dL (ref 6.0–8.5)
eGFR: 64 mL/min/{1.73_m2} (ref >59)

## 2020-11-09 LAB — LIPID PANEL
Chol/HDL Ratio: 3.3 ratio (ref 0.0–5.0)
Cholesterol, Total: 163 mg/dL (ref 100–199)
HDL: 49 mg/dL (ref >39)
LDL Chol Calc (NIH): 85 mg/dL (ref 0–99)
Triglycerides: 170 mg/dL — ABNORMAL HIGH (ref 0–149)
VLDL Cholesterol Cal: 29 mg/dL (ref 5–40)

## 2020-11-09 MED ORDER — AMLODIPINE BESYLATE 5 MG PO TABS: 5.0000 mg | ORAL_TABLET | Freq: Every day | ORAL | 3 refills | Status: DC

## 2020-11-09 NOTE — Patient Instructions (Signed)
Medication Instructions:  Please call [phone] to verify medications once you get home.   *If you need a refill on your cardiac medications before your next appointment, please call your pharmacy*   Lab Work: CBC, CMET, LIPID today   If you have labs (blood work) drawn today and your tests are completely normal, you will receive your results only by: MyChart Message (if you have MyChart) OR A paper copy in the mail If you have any lab test that is abnormal or we need to change your treatment, we will call you to review the results.   Testing/Procedures: CALCIUM SCORE   Follow-Up: At [facility], you and your health needs are our priority.  As part of our continuing mission to provide you with exceptional heart care, we have created designated Provider Care Teams.  These Care Teams include your primary Cardiologist (physician) and Advanced Practice Providers (APPs -  Physician Assistants and Nurse Practitioners) who all work together to provide you with the care you need, when you need it.  We recommend signing up for the patient portal called "MyChart".  Sign up information is provided on this After Visit Summary.  MyChart is used to connect with patients for Virtual Visits (Telemedicine).  Patients are able to view lab/test results, encounter notes, upcoming appointments, etc.  Non-urgent messages can be sent to your provider as well.   To learn more about what you can do with MyChart, go to [email or web address].    Your next appointment:   3 month(s)  The format for your next appointment:   In Person  Provider:   Epifanio Lesches, MD

## 2020-11-09 NOTE — Telephone Encounter (Signed)
Pt c/o medication issue:  1. Name of Medication:  amLODipine-benazepril (LOTREL) 5-10 MG per capsule  2. How are you currently taking this medication (dosage and times per day)? Not currently taking  3. Are you having a reaction (difficulty breathing--STAT)? no  4. What is your medication issue? Patient's wife states the patient is not taking amlodipine-benazepril. She states he switched a while ago to amlodipine besylate 5 mg tablet daily. She would like this updated in his record.

## 2020-11-09 NOTE — Telephone Encounter (Signed)
Noted and changed in EPIC 

## 2020-11-13 DIAGNOSIS — G4733 Obstructive sleep apnea (adult) (pediatric): Secondary | ICD-10-CM | POA: Diagnosis not present

## 2020-11-30 ENCOUNTER — Other Ambulatory Visit: Payer: Self-pay

## 2020-11-30 ENCOUNTER — Ambulatory Visit (INDEPENDENT_AMBULATORY_CARE_PROVIDER_SITE_OTHER)
Admission: RE | Admit: 2020-11-30 | Discharge: 2020-11-30 | Disposition: A | Discharge status: Home / Self Care | Payer: Self-pay | Source: Ambulatory Visit | Attending: Cardiology | Admitting: Cardiology

## 2020-11-30 DIAGNOSIS — I10 Essential (primary) hypertension: Secondary | ICD-10-CM

## 2020-12-01 ENCOUNTER — Other Ambulatory Visit: Payer: Self-pay | Admitting: *Deleted

## 2020-12-01 MED ORDER — ATORVASTATIN CALCIUM 40 MG PO TABS: 40.0000 mg | ORAL_TABLET | Freq: Every day | ORAL | 3 refills | Status: DC

## 2020-12-03 NOTE — Telephone Encounter (Signed)
Can we add-on sometime in next few weeks to discuss?

## 2020-12-07 NOTE — Progress Notes (Signed)
Cardiology Office Note:    Date:  12/08/2020   ID:  Edward [name], DOB 24-Dec-1945, MRN [id number]  PCP:  Farris Has, MD  Cardiologist:  None  Electrophysiologist:  None   Referring MD: Farris Has, MD   Chief Complaint  Patient presents with   Follow-up   Chest Pain      History of Present Illness:    Edward [name] is a 75 y.o. male with a hx of aortic stenosis, hypertension, hyperlipidemia, myasthenia gravis, OSA, DVT who presents for follow-up.  He was referred by Dr. Kateri Plummer for evaluation of dyspnea and chest pain, initially seen on 09/29/2020.  Echocardiogram 07/27/2017 showed LVEF 60 to 65%, grade 1 diastolic dysfunction, functionally bicuspid aortic valve with mild to moderate aortic stenosis and mild aortic regurgitation, mild MR, mild TR. Echocardiogram on 10/14/2020 showed normal biventricular function, mild LVH, mild to moderate aortic stenosis, mild aortic regurgitation, mild mitral regurgitation.  Lexiscan Myoview on 11/05/2020 showed fixed inferior defect likely representing artifact, no ischemia, EF 52%.  Calcium score in 11/30/2020 was 674 (72nd percentile)  Since last clinic visit, reports is doing okay.  Continue to have issues with heartburn and shoulder pain, reports occurs with stress and sometimes with exertion.  Also reports intermittent leg cramping.  Has not noticed worsening since increasing atorvastatin.    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 by echo 07/2017   Chronic kidney disease    kidney stones   Colon polyps    Diplopia 10/15/2015   Diverticulosis    Erectile dysfunction    GERD (gastroesophageal reflux disease)    with Barrett's esophagus   Gout    Hearing loss    hearing aides   HTN (hypertension)    Hypercholesteremia    Migraine headache    Myasthenia gravis (HCC)    Obesity    OSA (obstructive sleep apnea)    AHI 43/hr  now on CPAP at 10cm H2O   Peripheral neuropathy 01/14/2019   Right foot drop 12/03/2018    Past Surgical History:  Procedure Laterality Date   laser assisted uvuloplasty      Current Medications: Current Meds  Medication Sig   allopurinol (ZYLOPRIM) 300 MG tablet Take 300 mg by mouth daily.   amLODipine (NORVASC) 5 MG tablet Take 1 tablet (5 mg total) by mouth daily.   atorvastatin (LIPITOR) 40 MG tablet Take 1 tablet (40 mg total) by mouth daily.   Biotin 1 MG CAPS Take by mouth.   Biotin 1000 MCG tablet Take 2,500 mcg by mouth daily.    cetirizine (ZYRTEC) 10 MG tablet Take 10 mg by mouth daily.   fish oil-omega-3 fatty acids 1000 MG capsule Take 1 g by mouth 2 (two) times daily.   isosorbide mononitrate (IMDUR) 30 MG 24 hr tablet Take 1 tablet (30 mg total) by mouth daily.   lidocaine (LIDODERM) 5 % Place 1 patch onto the skin daily. Remove & Discard patch within 12 hours or as directed by MD   losartan (COZAAR) 50 MG tablet Take 1 tablet by mouth daily.   Multiple Vitamin (MULTIVITAMIN) capsule Take 1 capsule by mouth daily.   nitroGLYCERIN (NITROSTAT) 0.4 MG SL tablet Place 1 tablet (0.4 mg total) under the tongue every 5 (five) minutes as needed.   Omega-3 Fatty Acids (FISH OIL OMEGA-3) 1000 MG CAPS    omeprazole (PRILOSEC  OTC) 20 MG tablet    omeprazole (PRILOSEC) 20 MG capsule Take 20 mg by mouth daily.   predniSONE (DELTASONE) 1 MG tablet Take 1 tablet (1 mg total) by mouth daily.   predniSONE (DELTASONE) 5 MG tablet TAKE 1 TABLET BY MOUTH EVERY DAY   pyridostigmine (MESTINON) 60 MG tablet TAKE 1/2 TABLET 3 TIMES DAILY   XARELTO 20 MG TABS tablet      Allergies:   Patient has no known allergies.   Social History   Socioeconomic History   Marital status: Married    Spouse name: [name]   Number of children: 2   Years of education: [employer]   Highest education level: Not on file  Occupational History   Occupation: Retired  Tobacco Use   Smoking status: Never    Smokeless tobacco: Never  Vaping Use   Vaping Use: Never used  Substance and Sexual Activity   Alcohol use: Yes    Alcohol/week: 0.0 standard drinks    Comment: rare wine   Drug use: No   Sexual activity: Not on file  Other Topics Concern   Not on file  Social History Narrative   Lives home w/ his wife   Right-handed    Drinks about 3 cups of caffeine per day   Social Determinants of Health   Financial Resource Strain: Not on file  Food Insecurity: Not on file  Transportation Needs: Not on file  Physical Activity: Not on file  Stress: Not on file  Social Connections: Not on file     Family History: The patient's family history includes Anemia in his brother; Cancer in his brother; Heart attack in his father; Heart disease in his brother and father; Hypertension in his brother, brother, mother, and sister.  ROS:   Please see the history of present illness.   (+) Chest discomfort, burning sensation (+) Cough (+) Shortness of breath (+) Lightheadedness (+) fatigued (+)exhaustion (+) Stress All other systems reviewed and are negative.  EKGs/Labs/Other Studies Reviewed:    The following studies were reviewed today:  US LE Venous 08/01/2017: Final Interpretation:  Right: There is evidence of acute DVT in the Posterior Tibial veins.  Ultrasound is unable to distinguish whether obstruction in the Femoral  vein, and Popliteal vein is acute or chronic.   TTE 07/27/2017: - Left ventricle: The cavity size was normal. There was mild    concentric hypertrophy. Systolic function was normal. The    estimated ejection fraction was in the range of 60% to 65%. Wall    motion was normal; there were no regional wall motion    abnormalities. Doppler parameters are consistent with abnormal    left ventricular relaxation (grade 1 diastolic dysfunction).  - Aortic valve: Functionally bicuspid; moderately thickened,    moderately calcified leaflets. There was mild to moderate     stenosis. There was mild regurgitation. Peak gradient (S): 25 mm    Hg. Valve area (VTI): 1.24 cm^2. Valve area (Vmax): 1.42 cm^2.    Valve area (Vmean): 1.37 cm^2.  - Mitral valve: There was mild regurgitation.  - Left atrium: The atrium was at the upper limits of normal in    size.  - Right ventricle: The cavity size was normal. Wall thickness was    normal. Systolic function was normal.  - Tricuspid valve: There was mild regurgitation.  - Pulmonary arteries: Systolic pressure was within the normal    range.   US Venous LE Doppler 06/27/2016: 1. Positive for acute to subacute  deep venous thrombosis extending from the popliteal vein into the common femoral vein. The common femoral and femoral venous thrombus is nonocclusive while the popliteal venous thrombus is occlusive.  Echo 08/31/2015: - Left ventricle: The cavity size was normal. Wall thickness was    normal. Systolic function was vigorous. The estimated ejection    fraction was in the range of 65% to 70%. Wall motion was normal;    there were no regional wall motion abnormalities. Doppler    parameters are consistent with abnormal left ventricular    relaxation (grade 1 diastolic dysfunction).  - Aortic valve: There was mild regurgitation. Mean gradient (S): 9    mm Hg. Peak gradient (S): 15 mm Hg. Valve area (VTI): 1.98 cm^2.    Valve area (Vmax): 2.07 cm^2. Valve area (Vmean): 1.87 cm^2.  - Pulmonary arteries: Systolic pressure was mildly increased. PA    peak pressure: 32 mm Hg (S).   EKG:  12/08/20: Sinus rhythm, PVCs, rate 71, Q waves in lead III 11/09/2020: no ekg ordered today.  09/29/2020: NSR, PACs, PVCs, nonspecific T wave flattening, rate 84 bpm   Recent Labs: 11/09/2020: ALT 19; BUN 25; Creatinine, Ser 1.20; Hemoglobin 14.1; Platelets 219; Potassium 4.8; Sodium 140  Recent Lipid Panel    Component Value Date/Time   CHOL 163 11/09/2020 1028   TRIG 170 (H) 11/09/2020 1028   HDL 49 11/09/2020 1028   CHOLHDL 3.3  11/09/2020 1028   LDLCALC 85 11/09/2020 1028    Physical Exam:    VS:  BP (!) 126/58 (BP Location: Right Arm, Patient Position: Sitting, Cuff Size: Large)   Pulse 71   Ht 5\' 9"  (1.753 m)   Wt 213 lb (96.6 kg)   BMI 31.45 kg/m     Wt Readings from Last 3 Encounters:  12/08/20 213 lb (96.6 kg)  11/09/20 223 lb 9.6 oz (101.4 kg)  11/05/20 228 lb (103.4 kg)     GEN: Well nourished, well developed in no acute distress HEENT: Normal NECK: No JVD; No carotid bruits CARDIAC: RRR, 2/6 systolic murmur RESPIRATORY:  Clear to auscultation without rales, wheezing or rhonchi  ABDOMEN: Soft, non-tender, non-distended MUSCULOSKELETAL:  Trace bilateral edema  SKIN: Warm and dry NEUROLOGIC:  Alert and oriented x 3 PSYCHIATRIC:  Normal affect   ASSESSMENT:    1. Chest pain of uncertain etiology   2. DOE (dyspnea on exertion)   3. PVC's (premature ventricular contractions)   4. Aortic valve stenosis, etiology of cardiac valve disease unspecified   5. Essential hypertension   6. Hyperlipidemia, unspecified hyperlipidemia type     PLAN:    Chest pain/DOE/fatigue: Reports chest pain, in addition to dyspnea on exertion and fatigue.  Lexiscan Myoview on 11/05/2020 showed fixed inferior defect likely representing artifact, no ischemia, EF 52%.  Echocardiogram on 10/14/2020 showed normal biventricular function, mild LVH, mild to moderate aortic stenosis, mild aortic regurgitation, mild mitral regurgitation.  Calcium score in 11/30/2020 was 674 (72nd percentile) -Continues to have chest pain, which is somewhat atypical as describes burning sensation but does seem to occur with stress/exertion.  Stress test is low risk.  Discussed that could pursue heart catheterization for definitive diagnosis or can trial medical management.  He would prefer trial of medical management.  Will start Imdur 30 mg daily  Aortic stenosis: Mild to moderate on echo 07/2017.  Echocardiogram on 10/14/2020 showed normal  biventricular function, mild LVH, mild to moderate aortic stenosis, mild aortic regurgitation, mild mitral regurgitation.  We will monitor  Hyperlipidemia: On atorvastatin 20 mg daily, LDL 94 on 04/15/2020.  Calcium score in 11/30/2020 was 674 (72nd percentile).  Atorvastatin dose increased to 40 mg daily  Hypertension: On amlodipine 5 mg daily and losartan 50 mg daily.  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  Check ZIO patch x3 days to quantify PVC burden  RTC in 3 months   Medication Adjustments/Labs and Tests Ordered: Current medicines are reviewed at length with the patient today.  Concerns regarding medicines are outlined above.  Orders Placed This Encounter  Procedures   LONG TERM MONITOR (3-14 DAYS)   EKG 12-Lead    Meds ordered this encounter  Medications   nitroGLYCERIN (NITROSTAT) 0.4 MG SL tablet    Sig: Place 1 tablet (0.4 mg total) under the tongue every 5 (five) minutes as needed.    Dispense:  25 tablet    Refill:  3   isosorbide mononitrate (IMDUR) 30 MG 24 hr tablet    Sig: Take 1 tablet (30 mg total) by mouth daily.    Dispense:  90 tablet    Refill:  3     Patient Instructions  Medication Instructions:  START isosorbide (Imdur) 30 mg once daily  Take sublingual nitroglycerin AS NEEDED for chest pain  *If you need a refill on your cardiac medications before your next appointment, please call your pharmacy*  Testing/Procedures: ZIO XT- Long Term Monitor Instructions   Your physician has requested you wear a ZIO patch monitor for _3__ days.  This is a single patch monitor.   [employer] supplies one patch monitor per enrollment. Additional stickers are not available. Please do not apply patch if you will be having a Nuclear Stress Test, Echocardiogram, Cardiac CT, MRI, or Chest Xray during the period you would be wearing the monitor. The patch cannot be worn during  these tests. You cannot remove and re-apply the ZIO XT patch monitor.  Your ZIO patch monitor will be sent Fed Ex from [employer] directly to your home address. It may take 3-5 days to receive your monitor after you have been enrolled.  Once you have received your monitor, please review the enclosed instructions. Your monitor has already been registered assigning a specific monitor serial # to you.  Billing and Patient Assistance Program Information   We have supplied [employer] with any of your insurance information on file for billing purposes. [employer] offers a sliding scale Patient Assistance Program for patients that do not have insurance, or whose insurance does not completely cover the cost of the ZIO monitor.   You must apply for the Patient Assistance Program to qualify for this discounted rate.     To apply, please call [employer] at [phone], select option 4, then select option 2, and ask to apply for Patient Assistance Program.  Meredeth Ide will ask your household income, and how many people are in your household.  They will quote your out-of-pocket cost based on that information.  [employer] will also be able to set up a 96-month, interest-free payment plan if needed.  Applying the monitor   Shave hair from upper left chest.  Hold abrader disc by orange tab. Rub abrader in 40 strokes over the upper left chest as indicated in your monitor instructions.  Clean area with 4 enclosed alcohol pads. Let dry.  Apply patch as indicated in monitor instructions. Patch will be placed under collarbone on left side of  chest with arrow pointing upward.  Rub patch adhesive wings for 2 minutes. Remove white label marked "1". Remove the white label marked "2". Rub patch adhesive wings for 2 additional minutes.  While looking in a mirror, press and release button in center of patch. A small green light will flash 3-4 times. This will be your only indicator that the monitor has been turned on. ?  Do not  shower for the first 24 hours. You may shower after the first 24 hours.  Press the button if you feel a symptom. You will hear a small click. Record Date, Time and Symptom in the Patient Logbook.  When you are ready to remove the patch, follow instructions on the last 2 pages of the Patient Logbook. Stick patch monitor onto the last page of Patient Logbook.  Place Patient Logbook in the blue and white box.  Use locking tab on box and tape box closed securely.  The blue and white box has prepaid postage on it. Please place it in the mailbox as soon as possible. Your physician should have your test results approximately 7 days after the monitor has been mailed back to [facility].  Call [facility] Customer Care at [phone] if you have questions regarding your ZIO XT patch monitor. Call them immediately if you see an orange light blinking on your monitor.  If your monitor falls off in less than 4 days, contact our Monitor department at [phone]. ?If your monitor becomes loose or falls off after 4 days call [employer] at [phone] for suggestions on securing your monitor.?  Follow-Up: At [facility], you and your health needs are our priority.  As part of our continuing mission to provide you with exceptional heart care, we have created designated Provider Care Teams.  These Care Teams include your primary Cardiologist (physician) and Advanced Practice Providers (APPs -  Physician Assistants and Nurse Practitioners) who all work together to provide you with the care you need, when you need it.  We recommend signing up for the patient portal called "MyChart".  Sign up information is provided on this After Visit Summary.  MyChart is used to connect with patients for Virtual Visits (Telemedicine).  Patients are able to view lab/test results, encounter notes, upcoming appointments, etc.  Non-urgent messages can be sent to your provider as well.   To learn more about what you can do with  MyChart, go to [email or web address].    Your next appointment:   As scheduled with Dr. Bjorn Pippin       Signed, Little Ishikawa, MD  12/08/2020 5:52 PM    [facility]

## 2020-12-08 ENCOUNTER — Other Ambulatory Visit: Payer: Self-pay

## 2020-12-08 ENCOUNTER — Ambulatory Visit (INDEPENDENT_AMBULATORY_CARE_PROVIDER_SITE_OTHER): Payer: Medicare PPO

## 2020-12-08 ENCOUNTER — Ambulatory Visit (INDEPENDENT_AMBULATORY_CARE_PROVIDER_SITE_OTHER): Payer: Medicare PPO | Admitting: Cardiology

## 2020-12-08 ENCOUNTER — Encounter: Payer: Self-pay | Admitting: Cardiology

## 2020-12-08 VITALS — BP 126/58 | HR 71 | Ht 69.0 in | Wt 213.0 lb

## 2020-12-08 DIAGNOSIS — E785 Hyperlipidemia, unspecified: Secondary | ICD-10-CM | POA: Diagnosis not present

## 2020-12-08 DIAGNOSIS — I10 Essential (primary) hypertension: Secondary | ICD-10-CM

## 2020-12-08 DIAGNOSIS — R079 Chest pain, unspecified: Secondary | ICD-10-CM

## 2020-12-08 DIAGNOSIS — I493 Ventricular premature depolarization: Secondary | ICD-10-CM

## 2020-12-08 DIAGNOSIS — R0600 Dyspnea, unspecified: Secondary | ICD-10-CM

## 2020-12-08 DIAGNOSIS — I350 Nonrheumatic aortic (valve) stenosis: Secondary | ICD-10-CM | POA: Diagnosis not present

## 2020-12-08 DIAGNOSIS — R0609 Other forms of dyspnea: Secondary | ICD-10-CM

## 2020-12-08 MED ORDER — ISOSORBIDE MONONITRATE ER 30 MG PO TB24: 30.0000 mg | ORAL_TABLET | Freq: Every day | ORAL | 3 refills | Status: DC

## 2020-12-08 MED ORDER — NITROGLYCERIN 0.4 MG SL SUBL: 0.4000 mg | SUBLINGUAL_TABLET | SUBLINGUAL | 3 refills | Status: DC | PRN

## 2020-12-08 NOTE — Progress Notes (Unsigned)
Enrolled patient for a 3 day Zio XT monitor to be mailed to patients home  

## 2020-12-08 NOTE — Patient Instructions (Signed)
Medication Instructions:  START isosorbide (Imdur) 30 mg once daily  Take sublingual nitroglycerin AS NEEDED for chest pain  *If you need a refill on your cardiac medications before your next appointment, please call your pharmacy*  Testing/Procedures: ZIO XT- Long Term Monitor Instructions   Your physician has requested you wear a ZIO patch monitor for _3__ days.  This is a single patch monitor.   [employer] supplies one patch monitor per enrollment. Additional stickers are not available. Please do not apply patch if you will be having a Nuclear Stress Test, Echocardiogram, Cardiac CT, MRI, or Chest Xray during the period you would be wearing the monitor. The patch cannot be worn during these tests. You cannot remove and re-apply the ZIO XT patch monitor.  Your ZIO patch monitor will be sent Fed Ex from [employer] directly to your home address. It may take 3-5 days to receive your monitor after you have been enrolled.  Once you have received your monitor, please review the enclosed instructions. Your monitor has already been registered assigning a specific monitor serial # to you.  Billing and Patient Assistance Program Information   We have supplied [employer] with any of your insurance information on file for billing purposes. [employer] offers a sliding scale Patient Assistance Program for patients that do not have insurance, or whose insurance does not completely cover the cost of the ZIO monitor.   You must apply for the Patient Assistance Program to qualify for this discounted rate.     To apply, please call [employer] at [phone], select option 4, then select option 2, and ask to apply for Patient Assistance Program.  Meredeth Ide will ask your household income, and how many people are in your household.  They will quote your out-of-pocket cost based on that information.  [employer] will also be able to set up a 19-month, interest-free payment plan if needed.  Applying the monitor   Shave  hair from upper left chest.  Hold abrader disc by orange tab. Rub abrader in 40 strokes over the upper left chest as indicated in your monitor instructions.  Clean area with 4 enclosed alcohol pads. Let dry.  Apply patch as indicated in monitor instructions. Patch will be placed under collarbone on left side of chest with arrow pointing upward.  Rub patch adhesive wings for 2 minutes. Remove white label marked "1". Remove the white label marked "2". Rub patch adhesive wings for 2 additional minutes.  While looking in a mirror, press and release button in center of patch. A small green light will flash 3-4 times. This will be your only indicator that the monitor has been turned on. ?  Do not shower for the first 24 hours. You may shower after the first 24 hours.  Press the button if you feel a symptom. You will hear a small click. Record Date, Time and Symptom in the Patient Logbook.  When you are ready to remove the patch, follow instructions on the last 2 pages of the Patient Logbook. Stick patch monitor onto the last page of Patient Logbook.  Place Patient Logbook in the blue and white box.  Use locking tab on box and tape box closed securely.  The blue and white box has prepaid postage on it. Please place it in the mailbox as soon as possible. Your physician should have your test results approximately 7 days after the monitor has been mailed back to [facility].  Call [facility] Customer Care at [phone] if you have questions regarding  your ZIO XT patch monitor. Call them immediately if you see an orange light blinking on your monitor.  If your monitor falls off in less than 4 days, contact our Monitor department at [phone]. ?If your monitor becomes loose or falls off after 4 days call [employer] at [phone] for suggestions on securing your monitor.?  Follow-Up: At [facility], you and your health needs are our priority.  As part of our continuing mission to provide you with  exceptional heart care, we have created designated Provider Care Teams.  These Care Teams include your primary Cardiologist (physician) and Advanced Practice Providers (APPs -  Physician Assistants and Nurse Practitioners) who all work together to provide you with the care you need, when you need it.  We recommend signing up for the patient portal called "MyChart".  Sign up information is provided on this After Visit Summary.  MyChart is used to connect with patients for Virtual Visits (Telemedicine).  Patients are able to view lab/test results, encounter notes, upcoming appointments, etc.  Non-urgent messages can be sent to your provider as well.   To learn more about what you can do with MyChart, go to [email or web address].    Your next appointment:   As scheduled with Dr. Bjorn Pippin

## 2020-12-11 DIAGNOSIS — I493 Ventricular premature depolarization: Secondary | ICD-10-CM | POA: Diagnosis not present

## 2020-12-16 DIAGNOSIS — I493 Ventricular premature depolarization: Secondary | ICD-10-CM | POA: Diagnosis not present

## 2020-12-24 DIAGNOSIS — M4312 Spondylolisthesis, cervical region: Secondary | ICD-10-CM | POA: Diagnosis not present

## 2020-12-30 ENCOUNTER — Telehealth: Payer: Self-pay | Admitting: Cardiology

## 2020-12-30 DIAGNOSIS — I493 Ventricular premature depolarization: Secondary | ICD-10-CM

## 2020-12-30 MED ORDER — METOPROLOL SUCCINATE ER 25 MG PO TB24: 25.0000 mg | ORAL_TABLET | Freq: Every day | ORAL | 3 refills | Status: DC

## 2020-12-30 NOTE — Telephone Encounter (Signed)
The patient's wife has been made aware and verbalized her understanding.     Little Ishikawa, MD  12/28/2020  5:20 PM EDT     Frequent PVCs and episodes of NSVT.  Recommend starting Toprol-XL 25 mg daily and referral to EP for evaluation

## 2020-12-30 NOTE — Telephone Encounter (Signed)
   Pt's wife calling to get heart monitor result

## 2021-01-05 DIAGNOSIS — M5412 Radiculopathy, cervical region: Secondary | ICD-10-CM | POA: Diagnosis not present

## 2021-01-05 DIAGNOSIS — M4722 Other spondylosis with radiculopathy, cervical region: Secondary | ICD-10-CM | POA: Diagnosis not present

## 2021-01-06 ENCOUNTER — Encounter: Payer: Self-pay | Admitting: Neurology

## 2021-01-06 ENCOUNTER — Ambulatory Visit (INDEPENDENT_AMBULATORY_CARE_PROVIDER_SITE_OTHER): Payer: Medicare PPO | Admitting: Neurology

## 2021-01-06 ENCOUNTER — Other Ambulatory Visit: Payer: Self-pay

## 2021-01-06 VITALS — BP 118/65 | HR 65 | Ht 69.0 in | Wt 217.0 lb

## 2021-01-06 DIAGNOSIS — M5412 Radiculopathy, cervical region: Secondary | ICD-10-CM | POA: Diagnosis not present

## 2021-01-06 DIAGNOSIS — G603 Idiopathic progressive neuropathy: Secondary | ICD-10-CM

## 2021-01-06 DIAGNOSIS — G7000 Myasthenia gravis without (acute) exacerbation: Secondary | ICD-10-CM

## 2021-01-06 DIAGNOSIS — H532 Diplopia: Secondary | ICD-10-CM

## 2021-01-06 MED ORDER — TRAZODONE HCL 50 MG PO TABS: 50.0000 mg | ORAL_TABLET | Freq: Every day | ORAL | 3 refills | Status: DC

## 2021-01-06 MED ORDER — PYRIDOSTIGMINE BROMIDE 60 MG PO TABS: ORAL_TABLET | ORAL | 3 refills | Status: DC

## 2021-01-06 NOTE — Progress Notes (Signed)
Reason for visit: Myasthenia gravis, peripheral neuropathy, bilateral foot drop  Edward [name] is an 74 y.o. male  History of present illness:  Edward [name] is a 75 year old right-handed white male with a history of myasthenia gravis primarily with ocular features.  The patient has a significant peripheral neuropathy associated with bilateral foot drops and some gait instability.  Within the last 6 months he has reported some heartburn type symptoms but he also he has had some right shoulder and neck discomfort with pain down the right arm.  He has seen Dr. Dutch Quint from neurosurgery, MRI of the cervical spine was done recently, I do not have the report of this.  The patient has had some medication alterations through cardiology.  He currently is on prednisone 6 mg daily and he takes Mestinon 60 mg, 1/2 tablet 3 times daily.  He is not sleeping well.  He reports some occasional dizziness when he stands up off and on.  The heat of the summer has worsened some of his myasthenic symptoms, he does have some generalized fatigue.  He reports occasional double vision and slight ptosis.  He reports no weakness of the arms or legs or difficulties with chewing or swallowing.  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 by echo 07/2017   Chronic kidney disease    kidney stones   Colon polyps    Diplopia 10/15/2015   Diverticulosis    Erectile dysfunction    GERD (gastroesophageal reflux disease)    with Barrett's esophagus   Gout    Hearing loss    hearing aides   HTN (hypertension)    Hypercholesteremia    Migraine headache    Myasthenia gravis (HCC)    Obesity    OSA (obstructive sleep apnea)    AHI 43/hr now on CPAP at 10cm H2O   Peripheral neuropathy 01/14/2019   Right foot drop 12/03/2018    Past Surgical History:  Procedure Laterality Date   laser assisted uvuloplasty       Family History  Problem Relation Age of Onset   Hypertension Mother    Heart attack Father    Heart disease Father    Hypertension Sister    Heart disease Brother    Hypertension Brother    Hypertension Brother    Anemia Brother    Cancer Brother     Social history:  reports that he has never smoked. He has never used smokeless tobacco. He reports current alcohol use. He reports that he does not use drugs.   No Known Allergies  Medications:  Prior to Admission medications   Medication Sig Start Date End Date Taking? Authorizing Provider  allopurinol (ZYLOPRIM) 300 MG tablet Take 300 mg by mouth daily.   Yes [provider]  amLODipine (NORVASC) 5 MG tablet Take 1 tablet (5 mg total) by mouth daily. 11/09/20 02/07/21 Yes Little Ishikawa, MD  atorvastatin (LIPITOR) 40 MG tablet Take 1 tablet (40 mg total) by mouth daily. 12/01/20  Yes Little Ishikawa, MD  Biotin 1 MG CAPS Take by mouth.   Yes [provider]  Biotin 1000 MCG tablet Take 2,500 mcg by mouth daily.    Yes [provider]  cetirizine (ZYRTEC) 10 MG tablet Take 10 mg by mouth daily.   Yes [provider]  fish oil-omega-3 fatty acids 1000 MG capsule Take  1 g by mouth 2 (two) times daily.   Yes [provider]  isosorbide mononitrate (IMDUR) 30 MG 24 hr tablet Take 1 tablet (30 mg total) by mouth daily. 12/08/20  Yes Little Ishikawa, MD  lidocaine (LIDODERM) 5 % Place 1 patch onto the skin daily. Remove & Discard patch within 12 hours or as directed by MD 12/03/18  Yes York Spaniel, MD  losartan (COZAAR) 50 MG tablet Take 1 tablet by mouth daily.   Yes [provider]  metoprolol succinate (TOPROL-XL) 25 MG 24 hr tablet Take 1 tablet (25 mg total) by mouth daily. Take with or immediately following a meal. 12/30/20 03/30/21 Yes Little Ishikawa, MD  Multiple Vitamin (MULTIVITAMIN) capsule Take 1 capsule by mouth daily.   Yes [provider]  nitroGLYCERIN (NITROSTAT) 0.4 MG SL tablet Place 1 tablet (0.4 mg total) under the tongue every 5 (five) minutes as needed. 12/08/20 03/08/21 Yes Little Ishikawa, MD  Omega-3 Fatty Acids (FISH OIL OMEGA-3) 1000 MG CAPS    Yes [provider]  omeprazole (PRILOSEC OTC) 20 MG tablet    Yes [provider]  omeprazole (PRILOSEC) 20 MG capsule Take 20 mg by mouth daily.   Yes [provider]  predniSONE (DELTASONE) 1 MG tablet Take 1 tablet (1 mg total) by mouth daily. 09/08/20  Yes York Spaniel, MD  predniSONE (DELTASONE) 5 MG tablet TAKE 1 TABLET BY MOUTH EVERY DAY 09/29/20  Yes York Spaniel, MD  pyridostigmine (MESTINON) 60 MG tablet TAKE 1/2 TABLET 3 TIMES DAILY 08/24/20  Yes York Spaniel, MD  XARELTO 20 MG TABS tablet  03/10/18  Yes [provider]    ROS:  Out of a complete 14 system review of symptoms, the patient complains only of the following symptoms, and all other reviewed systems are negative.  Double vision Fatigue Heartburn Neck pain, right arm pain  Blood pressure 118/65, pulse 65, height 5\' 9"  (1.753 m), weight 217 lb (98.4 kg).  Physical Exam  General: The patient is alert and cooperative at the time of the examination.  The patient is moderately to markedly obese.  Skin: No significant peripheral edema is noted.   Neurologic Exam  Mental status: The patient is alert and oriented x 3 at the time of the examination. The patient has apparent normal recent and remote memory, with an apparently normal attention span and concentration ability.   Cranial nerves: Facial symmetry is present. Speech is normal, no aphasia or dysarthria is noted. Extraocular movements are full. Visual fields are full.  With superior gaze for 1 minute, some double vision was subjectively reported in 25 seconds, no increasing ptosis was seen.  Motor: The patient has good strength in all 4 extremities.  With arms outstretched for  1 minute, no fatigable weakness of the deltoid muscles was seen on either side.  The patient has prominent bilateral foot drop.  Sensory examination: Soft touch sensation is symmetric on the face, arms, and legs.  Coordination: The patient has good finger-nose-finger and heel-to-shin bilaterally.  Gait and station: The patient has a slightly wide-based gait, he walks without a cane.  Tandem gait is unsteady.  Romberg is negative but is slightly unsteady.  Reflexes: Deep tendon reflexes are symmetric, but are depressed.   Assessment/Plan:  1.  Myasthenia gravis  2.  Peripheral neuropathy  The patient has reported some heartburn type symptoms.  He is to ensure that he takes the prednisone with meals.  The patient will drop down to 5 mg of prednisone daily.  He is having chronic problems with insomnia, I will add low-dose trazodone in the evening.  He will follow-up here in 6 months, in the future he can be followed through Dr. Marjory Lies.  Marlan Palau MD 01/06/2021 10:06 AM  [facility] [address] [place], [place] [address]  Phone [phone] Fax [phone]

## 2021-02-01 ENCOUNTER — Other Ambulatory Visit: Payer: Self-pay | Admitting: Neurology

## 2021-02-05 ENCOUNTER — Other Ambulatory Visit: Payer: Self-pay

## 2021-02-05 ENCOUNTER — Encounter: Payer: Self-pay | Admitting: Cardiology

## 2021-02-05 ENCOUNTER — Ambulatory Visit (INDEPENDENT_AMBULATORY_CARE_PROVIDER_SITE_OTHER): Payer: Medicare PPO | Admitting: Cardiology

## 2021-02-05 VITALS — BP 154/70 | HR 54 | Ht 69.0 in | Wt 216.0 lb

## 2021-02-05 DIAGNOSIS — I350 Nonrheumatic aortic (valve) stenosis: Secondary | ICD-10-CM

## 2021-02-05 DIAGNOSIS — I493 Ventricular premature depolarization: Secondary | ICD-10-CM

## 2021-02-05 DIAGNOSIS — Q231 Congenital insufficiency of aortic valve: Secondary | ICD-10-CM

## 2021-02-05 DIAGNOSIS — I209 Angina pectoris, unspecified: Secondary | ICD-10-CM | POA: Diagnosis not present

## 2021-02-05 DIAGNOSIS — R079 Chest pain, unspecified: Secondary | ICD-10-CM | POA: Diagnosis not present

## 2021-02-05 MED ORDER — ISOSORBIDE MONONITRATE ER 30 MG PO TB24: 60.0000 mg | ORAL_TABLET | Freq: Every day | ORAL | 3 refills | Status: DC

## 2021-02-05 NOTE — Patient Instructions (Addendum)
Medication Instructions:  Your physician has recommended you make the following change in your medication:    INCREASE your IMDUR to 60 mg by mouth daily  *If you need a refill on your cardiac medications before your next appointment, please call your pharmacy*  Lab Work: You will get lab work today:  BMP and CBC If you have labs (blood work) drawn today and your tests are completely normal, you will receive your results only by: MyChart Message (if you have MyChart) OR A paper copy in the mail If you have any lab test that is abnormal or we need to change your treatment, we will call you to review the results.  Testing/Procedures: You will be scheduled for a cardiac catheterization.   [facility] CARDIOVASCULAR DIVISION [facility] [address] OFFICE [address] [place] [address] Dept: [phone] Loc: [phone]  PERRION DIESEL  02/05/2021  You are scheduled for a Cardiac Catheterization on Thursday, October 20 with Dr. Peter [place].  1. Please arrive at the [facility] (Main Entrance A) at [facility]: [address] [place], [place] [address] at 7:00 AM (This time is two hours before your procedure to ensure your preparation). Free valet parking service is available.   Special note: Every effort is made to have your procedure done on time. Please understand that emergencies sometimes delay scheduled procedures.  2. Diet: Do not eat solid foods after midnight.  The patient may have clear liquids until 5am upon the day of the procedure.  3. Labs:today  4. Medication instructions in preparation for your procedure:  Do NOT take your Xarelto for 2 days prior to your procedure.  Your last dose will be February 08, 2021 your PM dose.  On the morning of your procedure, take your Aspirin, mestinon, metoprolol, amlodipine and your IMDUR with sips of water.  5. Plan for one night stay--bring personal belongings. 6. Bring a  current list of your medications and current insurance cards. 7. You MUST have a responsible person to drive you home. 8. Someone MUST be with you the first 24 hours after you arrive home or your discharge will be delayed. 9. Please wear clothes that are easy to get on and off and wear slip-on shoes.  Thank you for allowing [place] to care for you!   -- [facility]   Coronary Angiogram With Stent Coronary angiogram with stent placement is a procedure to widen or open a narrow blood vessel of the heart (coronary artery). Arteries may become blocked by cholesterol buildup (plaques) in the lining of the artery wall. When a coronary artery becomes partially blocked, blood flow to that area decreases. This may lead to chest pain or a heart attack (myocardial infarction). A stent is a small piece of metal that looks like mesh or spring. Stent placement may be done as treatment after a heart attack, or to prevent a heart attack if a blocked artery is found by a coronary angiogram. Let your health care provider know about: Any allergies you have, including allergies to medicines or contrast dye. All medicines you are taking, including vitamins, herbs, eye drops, creams, and over-the-counter medicines. Any problems you or family members have had with anesthetic medicines. Any blood disorders you have. Any surgeries you have had. Any medical conditions you have, including kidney problems or kidney failure. Whether you are pregnant or may be pregnant. Whether you are breastfeeding. What are the risks? Generally, this  is a safe procedure. However, serious problems may occur, including: Damage to nearby structures or organs, such as the heart, blood vessels, or kidneys. A return of blockage. Bleeding, infection, or bruising at the insertion site. A collection of blood under the skin (hematoma) at the insertion site. A blood clot in another part of the body. Allergic  reaction to medicines or dyes. Bleeding into the abdomen (retroperitoneal bleeding). Stroke (rare). Heart attack (rare). What happens before the procedure? Staying hydrated Follow instructions from your health care provider about hydration, which may include: Up to 2 hours before the procedure - you may continue to drink clear liquids, such as water, clear fruit juice, black coffee, and plain tea.  Eating and drinking restrictions Follow instructions from your health care provider about eating and drinking, which may include: 8 hours before the procedure - stop eating heavy meals or foods, such as meat, fried foods, or fatty foods. 6 hours before the procedure - stop eating light meals or foods, such as toast or cereal. 2 hours before the procedure - stop drinking clear liquids. Medicines Ask your health care provider about: Changing or stopping your regular medicines. This is especially important if you are taking diabetes medicines or blood thinners. Taking medicines such as aspirin and ibuprofen. These medicines can thin your blood. Do not take these medicines unless your health care provider tells you to take them. Generally, aspirin is recommended before a thin tube, called a catheter, is passed through a blood vessel and inserted into the heart (cardiac catheterization). Taking over-the-counter medicines, vitamins, herbs, and supplements. General instructions Do not use any products that contain nicotine or tobacco for at least 4 weeks before the procedure. These products include cigarettes, e-cigarettes, and chewing tobacco. If you need help quitting, ask your health care provider. Plan to have someone take you home from the hospital or clinic. If you will be going home right after the procedure, plan to have someone with you for 24 hours. You may have tests and imaging procedures. Ask your health care provider: How your insertion site will be marked. Ask which artery will be used  for the procedure. What steps will be taken to help prevent infection. These may include: Removing hair at the insertion site. Washing skin with a germ-killing soap. Taking antibiotic medicine. What happens during the procedure?  An IV will be inserted into one of your veins. Electrodes may be placed on your chest to monitor your heart rate during the procedure. You will be given one or more of the following: A medicine to help you relax (sedative). A medicine to numb the area (local anesthetic) for catheter insertion. A small incision will be made for catheter insertion. The catheter will be inserted into an artery using a guide wire. The location may be in your groin, your wrist, or the fold of your arm (near your elbow). An X-ray procedure (fluoroscopy) will be used to help guide the catheter to the opening of the heart arteries. A dye will be injected into the catheter. X-rays will be taken. The dye helps to show where any narrowing or blockages are located in the arteries. Tell your health care provider if you have chest pain or trouble breathing. A tiny wire will be guided to the blocked spot, and a balloon will be inflated to make the artery wider. The stent will be expanded to crush the plaques into the wall of the vessel. The stent will hold the area open and improve the blood  flow. Most stents have a drug coating to reduce the risk of the stent narrowing over time. The artery may be made wider using a drill, laser, or other tools that remove plaques. The catheter will be removed when the blood flow improves. The stent will stay where it was placed, and the lining of the artery will grow over it. A bandage (dressing) will be placed on the insertion site. Pressure will be applied to stop bleeding. The IV will be removed. This procedure may vary among health care providers and hospitals. What happens after the procedure? Your blood pressure, heart rate, breathing rate, and blood  oxygen level will be monitored until you leave the hospital or clinic. If the procedure is done through the leg, you will lie flat in bed for a few hours or for as long as told by your health care provider. You will be instructed not to bend or cross your legs. The insertion site and the pulse in your foot or wrist will be checked often. You may have more blood tests, X-rays, and a test that records the electrical activity of your heart (electrocardiogram, or ECG). Do not drive for 24 hours if you were given a sedative during your procedure. Summary Coronary angiogram with stent placement is a procedure to widen or open a narrowed coronary artery. This is done to treat heart problems. Before the procedure, let your health care provider know about all the medical conditions and surgeries you have or have had. This is a safe procedure. However, some problems may occur, including damage to nearby structures or organs, bleeding, blood clots, or allergies. Follow your health care provider's instructions about eating, drinking, medicines, and other lifestyle changes, such as quitting tobacco use before the procedure. This information is not intended to replace advice given to you by your health care provider. Make sure you discuss any questions you have with your health care provider. Document Revised: 10/31/2018 Document Reviewed: 10/31/2018 Elsevier Patient Education  2022 [employer].

## 2021-02-05 NOTE — Progress Notes (Signed)
Electrophysiology Office Note:    Date:  02/05/2021   ID:  Edward [name], DOB [birth year], MRN [id number]  PCP:  Farris Has, MD  [facility] Cardiologist:  None  [facility] Electrophysiologist:  Lanier Prude, MD   Referring MD: Little Ishikawa*   Chief Complaint: PVCs and chest pain  History of Present Illness:    Edward [name] is a 75 y.o. male who presents for an evaluation of PVCs and chest pain at the request of Dr. Bjorn Pippin.  Their medical history includes bicuspid aortic valve with at least moderate AAS, chronic kidney disease, GERD, hypertension, hyperlipidemia, myasthenia gravis, obesity, obstructive sleep apnea on CPAP. The patient was seen by Dr. Bjorn Pippin on December 08, 2020.  At that appointment he reported intermittent chest discomfort.  He reported right arm pain that coincided with the chest pain.  Left heart catheterization was discussed with the patient declined and they started Imdur metoprolol.  Since starting these medications the discomfort in his right arm and chest has become less severe but it is still clearly present.  He tells me that with any level of aerobic exertion he experiences chest discomfort.  When he moves a trash can out to the street he has significant chest discomfort.  The pain persists in his right arm.    The patient also has a history of PVCs that Dr. Bjorn Pippin evaluated.  On the day of his appointment, there were frequent PVCs on the EKG.  A Zio patch was ordered for 3 days.  The Zio patch showed at least a 20% burden of PVCs.  He thinks that the PVCs become less frequent since starting the metoprolol.  No syncope or presyncope.    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 by echo 07/2017   Chronic kidney disease    kidney stones   Colon polyps    Diplopia 10/15/2015   Diverticulosis    Erectile dysfunction    GERD  (gastroesophageal reflux disease)    with Barrett's esophagus   Gout    Hearing loss    hearing aides   HTN (hypertension)    Hypercholesteremia    Migraine headache    Myasthenia gravis (HCC)    Obesity    OSA (obstructive sleep apnea)    AHI 43/hr now on CPAP at 10cm H2O   Peripheral neuropathy 01/14/2019   Right foot drop 12/03/2018    Past Surgical History:  Procedure Laterality Date   laser assisted uvuloplasty      Current Medications: Current Meds  Medication Sig   allopurinol (ZYLOPRIM) 300 MG tablet Take 300 mg by mouth daily.   amLODipine (NORVASC) 5 MG tablet Take 1 tablet (5 mg total) by mouth daily.   atorvastatin (LIPITOR) 40 MG tablet Take 1 tablet (40 mg total) by mouth daily.   Biotin 1 MG CAPS Take by mouth.   Biotin 1000 MCG tablet Take 2,500 mcg by mouth daily.    cetirizine (ZYRTEC) 10 MG tablet Take 10 mg by mouth daily.   fish oil-omega-3 fatty acids 1000 MG capsule Take 1 g by mouth 2 (two) times daily.   lidocaine (LIDODERM) 5 % Place 1 patch onto the skin daily. Remove & Discard patch within 12 hours or as directed by MD   losartan (COZAAR) 50 MG tablet Take 1 tablet by mouth daily.   metoprolol succinate (  TOPROL-XL) 25 MG 24 hr tablet Take 1 tablet (25 mg total) by mouth daily. Take with or immediately following a meal.   Multiple Vitamin (MULTIVITAMIN) capsule Take 1 capsule by mouth daily.   nitroGLYCERIN (NITROSTAT) 0.4 MG SL tablet Place 1 tablet (0.4 mg total) under the tongue every 5 (five) minutes as needed.   Omega-3 Fatty Acids (FISH OIL OMEGA-3) 1000 MG CAPS    omeprazole (PRILOSEC OTC) 20 MG tablet    omeprazole (PRILOSEC) 20 MG capsule Take 20 mg by mouth daily.   predniSONE (DELTASONE) 1 MG tablet Take 1 tablet (1 mg total) by mouth daily.   predniSONE (DELTASONE) 5 MG tablet TAKE 1 TABLET BY MOUTH EVERY DAY   pyridostigmine (MESTINON) 60 MG tablet 1/2 tablet three times a day   traZODone (DESYREL) 50 MG tablet TAKE 1 TABLET BY MOUTH  EVERYDAY AT BEDTIME   XARELTO 20 MG TABS tablet    [DISCONTINUED] isosorbide mononitrate (IMDUR) 30 MG 24 hr tablet Take 1 tablet (30 mg total) by mouth daily.     Allergies:   Patient has no known allergies.   Social History   Socioeconomic History   Marital status: Married    Spouse name: Linda   Number of children: 2   Years of education: College   Highest education level: Not on file  Occupational History   Occupation: Retired  Tobacco Use   Smoking status: Never   Smokeless tobacco: Never  Vaping Use   Vaping Use: Never used  Substance and Sexual Activity   Alcohol use: Yes    Alcohol/week: 0.0 standard drinks    Comment: rare wine   Drug use: No   Sexual activity: Not on file  Other Topics Concern   Not on file  Social History Narrative   Lives home w/ his wife   Right-handed    Drinks about 3 cups of caffeine per day   Social Determinants of Health   Financial Resource Strain: Not on file  Food Insecurity: Not on file  Transportation Needs: Not on file  Physical Activity: Not on file  Stress: Not on file  Social Connections: Not on file     Family History: The patient's family history includes Anemia in his brother; Cancer in his brother; Heart attack in his father; Heart disease in his brother and father; Hypertension in his brother, brother, mother, and sister.  ROS:   Please see the history of present illness.    All other systems reviewed and are negative.  EKGs/Labs/Other Studies Reviewed:    The following studies were reviewed today: Prior notes  December 16, 2020 ZIO monitor personally reviewed Frequent PVCs, almost 20% 3 episodes of SVT, longest lasting 13 beats at a rate of 150 bpm Review of telemetry during SVT suggest atrial tachycardia No sustained arrhythmias   EKG:  The ekg ordered today demonstrates sinus rhythm with rare PVC.  PVC has a steeply inferior axis.  Precordial transition is in V3 which is similar to the sinus QRS  transition.   Recent Labs: 11/09/2020: ALT 19; BUN 25; Creatinine, Ser 1.20; Hemoglobin 14.1; Platelets 219; Potassium 4.8; Sodium 140  Recent Lipid Panel    Component Value Date/Time   CHOL 163 11/09/2020 1028   TRIG 170 (H) 11/09/2020 1028   HDL 49 11/09/2020 1028   CHOLHDL 3.3 11/09/2020 1028   LDLCALC 85 11/09/2020 1028    Physical Exam:    VS:  BP (!) 154/70   Pulse (!) 54   Ht   5' 9" (1.753 m)   Wt 216 lb (98 kg)   BMI 31.90 kg/m     Wt Readings from Last 3 Encounters:  02/05/21 216 lb (98 kg)  01/06/21 217 lb (98.4 kg)  12/08/20 213 lb (96.6 kg)     GEN: Uncomfortable appearing in no acute distress.  Obese HEENT: Normal NECK: No JVD; No carotid bruits LYMPHATICS: No lymphadenopathy CARDIAC: RRR, 3 out of 6 crescendo decrescendo systolic murmur.  S2 is not clearly audible.  No rubs or gallops.   RESPIRATORY:  Clear to auscultation without rales, wheezing or rhonchi  ABDOMEN: Soft, non-tender, non-distended MUSCULOSKELETAL:  No edema; No deformity  SKIN: Warm and dry NEUROLOGIC:  Alert and oriented x 3 PSYCHIATRIC:  Normal affect       ASSESSMENT:    1. Typical angina (HCC)   2. Bicuspid aortic valve   3. Nonrheumatic aortic valve stenosis   4. PVC (premature ventricular contraction)    PLAN:    In order of problems listed above:  1. Typical angina (HCC) The patient has very typical sounding angina.  The pain is reproducible with aerobic exertion and improves with rest.  The pain is centrally located in his chest and radiates to the right arm.  I am concerned that this may represent an obstructive coronary lesion.  Also possible is more critical aortic valve stenosis than we have previously recognized on transthoracic echo.  I think he needs to have a left heart catheterization as soon as possible.  We will also get a right heart catheterization and measure an aortic valve gradient during the heart cath.  I have advised him to start taking an aspirin 81 mg  by mouth once daily. I discussed the left heart catheterization and right heart catheterization procedures in detail including the risks and he wishes to proceed.  2. Bicuspid aortic valve Moderate stenosis noted on June echo.  We will measure the gradient across the valve during the heart catheterization to confirm it is not more severe.  3. Nonrheumatic aortic valve stenosis See #2  4. PVC (premature ventricular contraction) I do not think this is his primary issue at this time.  Also today's EKG and rhythm strip only showed 1 PVC.  It is possible the more frequent PVCs in the past were due to ischemia.  I would recommend management with beta-blockers at this time.  Follow-up based on the above evaluation.  Total time spent with patient today 65 minutes. This includes reviewing records, evaluating the patient and coordinating care.  Medication Adjustments/Labs and Tests Ordered: Current medicines are reviewed at length with the patient today.  Concerns regarding medicines are outlined above.  Orders Placed This Encounter  Procedures   Basic Metabolic Panel (BMET)   CBC w/Diff   EKG 12-Lead   Meds ordered this encounter  Medications   isosorbide mononitrate (IMDUR) 30 MG 24 hr tablet    Sig: Take 2 tablets (60 mg total) by mouth daily.    Dispense:  90 tablet    Refill:  3    Pt will call when he needs refills     Signed, Everlena Mackley T. Shardae Kleinman, MD, FACC, FHRS 02/05/2021 9:01 PM    Electrophysiology [facility]  

## 2021-02-05 NOTE — H&P (View-Only) (Signed)
Electrophysiology Office Note:    Date:  02/05/2021   ID:  Edward [name], DOB [birth year], MRN [id number]  PCP:  Farris Has, MD  [facility] Cardiologist:  None  [facility] Electrophysiologist:  Lanier Prude, MD   Referring MD: Little Ishikawa*   Chief Complaint: PVCs and chest pain  History of Present Illness:    Edward [name] is a 75 y.o. male who presents for an evaluation of PVCs and chest pain at the request of Dr. Bjorn Pippin.  Their medical history includes bicuspid aortic valve with at least moderate AAS, chronic kidney disease, GERD, hypertension, hyperlipidemia, myasthenia gravis, obesity, obstructive sleep apnea on CPAP. The patient was seen by Dr. Bjorn Pippin on December 08, 2020.  At that appointment he reported intermittent chest discomfort.  He reported right arm pain that coincided with the chest pain.  Left heart catheterization was discussed with the patient declined and they started Imdur metoprolol.  Since starting these medications the discomfort in his right arm and chest has become less severe but it is still clearly present.  He tells me that with any level of aerobic exertion he experiences chest discomfort.  When he moves a trash can out to the street he has significant chest discomfort.  The pain persists in his right arm.    The patient also has a history of PVCs that Dr. Bjorn Pippin evaluated.  On the day of his appointment, there were frequent PVCs on the EKG.  A Zio patch was ordered for 3 days.  The Zio patch showed at least a 20% burden of PVCs.  He thinks that the PVCs become less frequent since starting the metoprolol.  No syncope or presyncope.    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 by echo 07/2017   Chronic kidney disease    kidney stones   Colon polyps    Diplopia 10/15/2015   Diverticulosis    Erectile dysfunction    GERD  (gastroesophageal reflux disease)    with Barrett's esophagus   Gout    Hearing loss    hearing aides   HTN (hypertension)    Hypercholesteremia    Migraine headache    Myasthenia gravis (HCC)    Obesity    OSA (obstructive sleep apnea)    AHI 43/hr now on CPAP at 10cm H2O   Peripheral neuropathy 01/14/2019   Right foot drop 12/03/2018    Past Surgical History:  Procedure Laterality Date   laser assisted uvuloplasty      Current Medications: Current Meds  Medication Sig   allopurinol (ZYLOPRIM) 300 MG tablet Take 300 mg by mouth daily.   amLODipine (NORVASC) 5 MG tablet Take 1 tablet (5 mg total) by mouth daily.   atorvastatin (LIPITOR) 40 MG tablet Take 1 tablet (40 mg total) by mouth daily.   Biotin 1 MG CAPS Take by mouth.   Biotin 1000 MCG tablet Take 2,500 mcg by mouth daily.    cetirizine (ZYRTEC) 10 MG tablet Take 10 mg by mouth daily.   fish oil-omega-3 fatty acids 1000 MG capsule Take 1 g by mouth 2 (two) times daily.   lidocaine (LIDODERM) 5 % Place 1 patch onto the skin daily. Remove & Discard patch within 12 hours or as directed by MD   losartan (COZAAR) 50 MG tablet Take 1 tablet by mouth daily.   metoprolol succinate (  TOPROL-XL) 25 MG 24 hr tablet Take 1 tablet (25 mg total) by mouth daily. Take with or immediately following a meal.   Multiple Vitamin (MULTIVITAMIN) capsule Take 1 capsule by mouth daily.   nitroGLYCERIN (NITROSTAT) 0.4 MG SL tablet Place 1 tablet (0.4 mg total) under the tongue every 5 (five) minutes as needed.   Omega-3 Fatty Acids (FISH OIL OMEGA-3) 1000 MG CAPS    omeprazole (PRILOSEC OTC) 20 MG tablet    omeprazole (PRILOSEC) 20 MG capsule Take 20 mg by mouth daily.   predniSONE (DELTASONE) 1 MG tablet Take 1 tablet (1 mg total) by mouth daily.   predniSONE (DELTASONE) 5 MG tablet TAKE 1 TABLET BY MOUTH EVERY DAY   pyridostigmine (MESTINON) 60 MG tablet 1/2 tablet three times a day   traZODone (DESYREL) 50 MG tablet TAKE 1 TABLET BY MOUTH  EVERYDAY AT BEDTIME   XARELTO 20 MG TABS tablet    [DISCONTINUED] isosorbide mononitrate (IMDUR) 30 MG 24 hr tablet Take 1 tablet (30 mg total) by mouth daily.     Allergies:   Patient has no known allergies.   Social History   Socioeconomic History   Marital status: Married    Spouse name: [name]   Number of children: 2   Years of education: [employer] education level: Not on file  Occupational History   Occupation: Retired  Tobacco Use   Smoking status: Never   Smokeless tobacco: Never  Vaping Use   Vaping Use: Never used  Substance and Sexual Activity   Alcohol use: Yes    Alcohol/week: 0.0 standard drinks    Comment: rare wine   Drug use: No   Sexual activity: Not on file  Other Topics Concern   Not on file  Social History Narrative   Lives home w/ his wife   Right-handed    Drinks about 3 cups of caffeine per day   Social Determinants of Health   Financial Resource Strain: Not on file  Food Insecurity: Not on file  Transportation Needs: Not on file  Physical Activity: Not on file  Stress: Not on file  Social Connections: Not on file     Family History: The patient's family history includes Anemia in his brother; Cancer in his brother; Heart attack in his father; Heart disease in his brother and father; Hypertension in his brother, brother, mother, and sister.  ROS:   Please see the history of present illness.    All other systems reviewed and are negative.  EKGs/Labs/Other Studies Reviewed:    The following studies were reviewed today: Prior notes  December 16, 2020 ZIO monitor personally reviewed Frequent PVCs, almost 20% 3 episodes of SVT, longest lasting 13 beats at a rate of 150 bpm Review of telemetry during SVT suggest atrial tachycardia No sustained arrhythmias   EKG:  The ekg ordered today demonstrates sinus rhythm with rare PVC.  PVC has a steeply inferior axis.  Precordial transition is in V3 which is similar to the sinus QRS  transition.   Recent Labs: 11/09/2020: ALT 19; BUN 25; Creatinine, Ser 1.20; Hemoglobin 14.1; Platelets 219; Potassium 4.8; Sodium 140  Recent Lipid Panel    Component Value Date/Time   CHOL 163 11/09/2020 1028   TRIG 170 (H) 11/09/2020 1028   HDL 49 11/09/2020 1028   CHOLHDL 3.3 11/09/2020 1028   LDLCALC 85 11/09/2020 1028    Physical Exam:    VS:  BP (!) 154/70   Pulse (!) 54   Ht  5\' 9"  (1.753 m)   Wt 216 lb (98 kg)   BMI 31.90 kg/m     Wt Readings from Last 3 Encounters:  02/05/21 216 lb (98 kg)  01/06/21 217 lb (98.4 kg)  12/08/20 213 lb (96.6 kg)     GEN: Uncomfortable appearing in no acute distress.  Obese HEENT: Normal NECK: No JVD; No carotid bruits LYMPHATICS: No lymphadenopathy CARDIAC: RRR, 3 out of 6 crescendo decrescendo systolic murmur.  S2 is not clearly audible.  No rubs or gallops.   RESPIRATORY:  Clear to auscultation without rales, wheezing or rhonchi  ABDOMEN: Soft, non-tender, non-distended MUSCULOSKELETAL:  No edema; No deformity  SKIN: Warm and dry NEUROLOGIC:  Alert and oriented x 3 PSYCHIATRIC:  Normal affect       ASSESSMENT:    1. Typical angina (HCC)   2. Bicuspid aortic valve   3. Nonrheumatic aortic valve stenosis   4. PVC (premature ventricular contraction)    PLAN:    In order of problems listed above:  1. Typical angina [facility]) The patient has very typical sounding angina.  The pain is reproducible with aerobic exertion and improves with rest.  The pain is centrally located in his chest and radiates to the right arm.  I am concerned that this may represent an obstructive coronary lesion.  Also possible is more critical aortic valve stenosis than we have previously recognized on transthoracic echo.  I think he needs to have a left heart catheterization as soon as possible.  We will also get a right heart catheterization and measure an aortic valve gradient during the heart cath.  I have advised him to start taking an aspirin 81 mg  by mouth once daily. I discussed the left heart catheterization and right heart catheterization procedures in detail including the risks and he wishes to proceed.  2. Bicuspid aortic valve Moderate stenosis noted on June echo.  We will measure the gradient across the valve during the heart catheterization to confirm it is not more severe.  3. Nonrheumatic aortic valve stenosis See #2  4. PVC (premature ventricular contraction) I do not think this is his primary issue at this time.  Also today's EKG and rhythm strip only showed 1 PVC.  It is possible the more frequent PVCs in the past were due to ischemia.  I would recommend management with beta-blockers at this time.  Follow-up based on the above evaluation.  Total time spent with patient today 65 minutes. This includes reviewing records, evaluating the patient and coordinating care.  Medication Adjustments/Labs and Tests Ordered: Current medicines are reviewed at length with the patient today.  Concerns regarding medicines are outlined above.  Orders Placed This Encounter  Procedures   Basic Metabolic Panel (BMET)   CBC w/Diff   EKG 12-Lead   Meds ordered this encounter  Medications   isosorbide mononitrate (IMDUR) 30 MG 24 hr tablet    Sig: Take 2 tablets (60 mg total) by mouth daily.    Dispense:  90 tablet    Refill:  3    Pt will call when he needs refills     Signed, July T. Sheria Lang, MD, [facility], [facility] 02/05/2021 9:01 PM    Electrophysiology [facility]

## 2021-02-08 ENCOUNTER — Telehealth: Payer: Self-pay | Admitting: Cardiology

## 2021-02-08 NOTE — Telephone Encounter (Signed)
Left message to call back  

## 2021-02-08 NOTE — Telephone Encounter (Signed)
Edward [name] is calling requesting information on where they are supposed to go for Edward [name]'s upcoming procedure. She is also wanting to know if she will be able to stay overnight with him for this. Please advise.

## 2021-02-09 ENCOUNTER — Telehealth: Payer: Self-pay | Admitting: *Deleted

## 2021-02-09 DIAGNOSIS — I350 Nonrheumatic aortic (valve) stenosis: Secondary | ICD-10-CM

## 2021-02-09 DIAGNOSIS — Z01812 Encounter for preprocedural laboratory examination: Secondary | ICD-10-CM

## 2021-02-09 DIAGNOSIS — I209 Angina pectoris, unspecified: Secondary | ICD-10-CM

## 2021-02-09 LAB — BASIC METABOLIC PANEL

## 2021-02-09 LAB — CBC WITH DIFFERENTIAL/PLATELET
Basophils Absolute: 0.1 10*3/uL (ref 0.0–0.2)
Basos: 1 %
EOS (ABSOLUTE): 0.1 10*3/uL (ref 0.0–0.4)
Eos: 1 %
Hematocrit: 40.8 % (ref 37.5–51.0)
Hemoglobin: 14 g/dL (ref 13.0–17.7)
Immature Grans (Abs): 0 10*3/uL (ref 0.0–0.1)
Immature Granulocytes: 0 %
Lymphocytes Absolute: 1.2 10*3/uL (ref 0.7–3.1)
Lymphs: 13 %
MCH: 29.9 pg (ref 26.6–33.0)
MCHC: 34.3 g/dL (ref 31.5–35.7)
MCV: 87 fL (ref 79–97)
Monocytes Absolute: 0.7 10*3/uL (ref 0.1–0.9)
Monocytes: 8 %
Neutrophils Absolute: 7.5 10*3/uL — ABNORMAL HIGH (ref 1.4–7.0)
Neutrophils: 77 %
Platelets: 210 10*3/uL (ref 150–450)
RBC: 4.68 x10E6/uL (ref 4.14–5.80)
RDW: 13.8 % (ref 11.6–15.4)
WBC: 9.6 10*3/uL (ref 3.4–10.8)

## 2021-02-09 NOTE — Telephone Encounter (Signed)
LMTCB to review procedure instructions/needs repeat BMP.

## 2021-02-09 NOTE — Telephone Encounter (Addendum)
Patient's wife (DPR)  reports pt has prior commitment and cannot go for repeat BMP tomorrow, per lab too late to go today, will plan to get BMP on arrival to hospital 02/11/21.   Reviewed procedure/mask/visitor instructions with patient's wife (DPR).  Patient's wife is aware that if patient does stay overnight at hospital for observation, once patient is in hospital room, one adult support person may remain with the patient overnight and must be in the room by 8 PM.

## 2021-02-09 NOTE — Telephone Encounter (Addendum)
Cardiac catheterization scheduled at [facility] for: Thursday February 11, 2021 9 AM Arrive [facility] Main Entrance A [facility]) at: 7 AM   No solid food after midnight prior to cath, clear liquids until 5 AM day of procedure.  Medication instructions: Hold: Xarelto-none 02/09/21 until post procedure  Except hold medications usual morning medications can be taken pre-cath with sips of water including aspirin 81 mg.    Confirmed patient has responsible adult to drive home post procedure and be with patient first 24 hours after arriving home.  [facility] does allow one visitor to accompany you and wait in the hospital waiting room while you are there for your procedure. You and your visitor will be asked to wear a mask once you enter the hospital.   Patient reports does not currently have any symptoms concerning for COVID-19 and no household members with COVID-19 like illness.     *02/05/21 BMP no result/cancelled

## 2021-02-10 ENCOUNTER — Other Ambulatory Visit: Payer: Self-pay

## 2021-02-10 ENCOUNTER — Telehealth: Payer: Self-pay | Admitting: Cardiology

## 2021-02-10 ENCOUNTER — Other Ambulatory Visit: Payer: Medicare PPO | Admitting: *Deleted

## 2021-02-10 DIAGNOSIS — Z01812 Encounter for preprocedural laboratory examination: Secondary | ICD-10-CM

## 2021-02-10 DIAGNOSIS — I209 Angina pectoris, unspecified: Secondary | ICD-10-CM | POA: Diagnosis not present

## 2021-02-10 DIAGNOSIS — I350 Nonrheumatic aortic (valve) stenosis: Secondary | ICD-10-CM

## 2021-02-10 LAB — BASIC METABOLIC PANEL
BUN/Creatinine Ratio: 24 (ref 10–24)
BUN: 30 mg/dL — ABNORMAL HIGH (ref 8–27)
CO2: 26 mmol/L (ref 20–29)
Calcium: 9.9 mg/dL (ref 8.6–10.2)
Chloride: 106 mmol/L (ref 96–106)
Creatinine, Ser: 1.25 mg/dL (ref 0.76–1.27)
Glucose: 140 mg/dL — ABNORMAL HIGH (ref 70–99)
Potassium: 4.7 mmol/L (ref 3.5–5.2)
Sodium: 138 mmol/L (ref 134–144)
eGFR: 60 mL/min/{1.73_m2} (ref >59)

## 2021-02-10 NOTE — Telephone Encounter (Signed)
Patient's wife (DPR) called to say patient would be available to get BMP this morning. Patient plans to go to [facility] lab now for [employer], arrival time at [facility] for cath tomorrow will be 7 AM since patient getting BMP today.

## 2021-02-10 NOTE — Telephone Encounter (Signed)
Laith, Antonelli A - 02/10/2021  1:10 PM Bertram Gala  Sent: Wed February 10, 2021  2:42 PM  To: Loa Socks, LPN; Wiliam Ke, RN; [facility] Heart[facility] Pre Cert/Auth; Tommi Emery, Amanda          Message  Good Afternoon,    I was able to complete a conversation with the nurse and we should have a decision in the next couple hours.    Thank you,  Bertram Gala  ----- Message -----

## 2021-02-10 NOTE — Telephone Encounter (Signed)
Alexandra from [facility] requesting a peer to peer with Dr. Luisa Hart and Dr. Lalla Brothers or any other staff member about the cath request. Phone: [phone] Case number: [id number]

## 2021-02-10 NOTE — Addendum Note (Signed)
Addended by: Jacqlyn Krauss on: 02/10/2021 09:47 AM   Modules accepted: Orders

## 2021-02-11 ENCOUNTER — Ambulatory Visit (HOSPITAL_COMMUNITY)
Admission: RE | Admit: 2021-02-11 | Discharge: 2021-02-11 | Disposition: A | Discharge status: Home / Self Care | Payer: Medicare PPO | Attending: Cardiology | Admitting: Cardiology

## 2021-02-11 ENCOUNTER — Other Ambulatory Visit: Payer: Self-pay

## 2021-02-11 ENCOUNTER — Encounter (HOSPITAL_COMMUNITY)
Admission: RE | Disposition: A | Discharge status: Home / Self Care | Payer: Self-pay | Source: Home / Self Care | Attending: Cardiology

## 2021-02-11 DIAGNOSIS — Z7901 Long term (current) use of anticoagulants: Secondary | ICD-10-CM | POA: Insufficient documentation

## 2021-02-11 DIAGNOSIS — Z79899 Other long term (current) drug therapy: Secondary | ICD-10-CM | POA: Diagnosis not present

## 2021-02-11 DIAGNOSIS — N189 Chronic kidney disease, unspecified: Secondary | ICD-10-CM | POA: Insufficient documentation

## 2021-02-11 DIAGNOSIS — E669 Obesity, unspecified: Secondary | ICD-10-CM | POA: Insufficient documentation

## 2021-02-11 DIAGNOSIS — Z6831 Body mass index (BMI) 31.0-31.9, adult: Secondary | ICD-10-CM | POA: Diagnosis not present

## 2021-02-11 DIAGNOSIS — Q230 Congenital stenosis of aortic valve: Secondary | ICD-10-CM

## 2021-02-11 DIAGNOSIS — I25119 Atherosclerotic heart disease of native coronary artery with unspecified angina pectoris: Secondary | ICD-10-CM

## 2021-02-11 DIAGNOSIS — G4733 Obstructive sleep apnea (adult) (pediatric): Secondary | ICD-10-CM | POA: Diagnosis present

## 2021-02-11 DIAGNOSIS — Q231 Congenital insufficiency of aortic valve: Secondary | ICD-10-CM

## 2021-02-11 DIAGNOSIS — I350 Nonrheumatic aortic (valve) stenosis: Secondary | ICD-10-CM | POA: Insufficient documentation

## 2021-02-11 DIAGNOSIS — E7800 Pure hypercholesterolemia, unspecified: Secondary | ICD-10-CM | POA: Diagnosis not present

## 2021-02-11 DIAGNOSIS — I10 Essential (primary) hypertension: Secondary | ICD-10-CM | POA: Diagnosis present

## 2021-02-11 DIAGNOSIS — I493 Ventricular premature depolarization: Secondary | ICD-10-CM | POA: Diagnosis present

## 2021-02-11 DIAGNOSIS — G7000 Myasthenia gravis without (acute) exacerbation: Secondary | ICD-10-CM | POA: Insufficient documentation

## 2021-02-11 DIAGNOSIS — K219 Gastro-esophageal reflux disease without esophagitis: Secondary | ICD-10-CM | POA: Insufficient documentation

## 2021-02-11 DIAGNOSIS — Z8249 Family history of ischemic heart disease and other diseases of the circulatory system: Secondary | ICD-10-CM | POA: Diagnosis not present

## 2021-02-11 DIAGNOSIS — I129 Hypertensive chronic kidney disease with stage 1 through stage 4 chronic kidney disease, or unspecified chronic kidney disease: Secondary | ICD-10-CM | POA: Diagnosis not present

## 2021-02-11 DIAGNOSIS — I2582 Chronic total occlusion of coronary artery: Secondary | ICD-10-CM | POA: Diagnosis not present

## 2021-02-11 DIAGNOSIS — I25118 Atherosclerotic heart disease of native coronary artery with other forms of angina pectoris: Secondary | ICD-10-CM | POA: Diagnosis not present

## 2021-02-11 DIAGNOSIS — I209 Angina pectoris, unspecified: Secondary | ICD-10-CM | POA: Diagnosis present

## 2021-02-11 DIAGNOSIS — Q2381 Bicuspid aortic valve: Secondary | ICD-10-CM

## 2021-02-11 HISTORY — PX: RIGHT/LEFT HEART CATH AND CORONARY ANGIOGRAPHY: CATH118266

## 2021-02-11 LAB — POCT I-STAT EG7
Acid-base deficit: 2 mmol/L (ref 0.0–2.0)
Acid-base deficit: 2 mmol/L (ref 0.0–2.0)
Bicarbonate: 25.1 mmol/L (ref 20.0–28.0)
Bicarbonate: 25.3 mmol/L (ref 20.0–28.0)
Calcium, Ion: 1.28 mmol/L (ref 1.15–1.40)
Calcium, Ion: 1.26 mmol/L (ref 1.15–1.40)
HCT: 35 % — ABNORMAL LOW (ref 39.0–52.0)
HCT: 34 % — ABNORMAL LOW (ref 39.0–52.0)
Hemoglobin: 11.9 g/dL — ABNORMAL LOW (ref 13.0–17.0)
Hemoglobin: 11.6 g/dL — ABNORMAL LOW (ref 13.0–17.0)
O2 Saturation: 73 %
O2 Saturation: 75 %
Potassium: 3.8 mmol/L (ref 3.5–5.1)
Potassium: 3.7 mmol/L (ref 3.5–5.1)
Sodium: 143 mmol/L (ref 135–145)
Sodium: 142 mmol/L (ref 135–145)
TCO2: 27 mmol/L (ref 22–32)
TCO2: 27 mmol/L (ref 22–32)
pCO2, Ven: 50.9 mmHg (ref 44.0–60.0)
pCO2, Ven: 50.8 mmHg (ref 44.0–60.0)
pH, Ven: 7.302 (ref 7.250–7.430)
pH, Ven: 7.305 (ref 7.250–7.430)
pO2, Ven: 43 mmHg (ref 32.0–45.0)
pO2, Ven: 45 mmHg (ref 32.0–45.0)

## 2021-02-11 LAB — POCT I-STAT 7, (LYTES, BLD GAS, ICA,H+H)
Acid-base deficit: 2 mmol/L (ref 0.0–2.0)
Bicarbonate: 23.5 mmol/L (ref 20.0–28.0)
Calcium, Ion: 1.22 mmol/L (ref 1.15–1.40)
HCT: 34 % — ABNORMAL LOW (ref 39.0–52.0)
Hemoglobin: 11.6 g/dL — ABNORMAL LOW (ref 13.0–17.0)
O2 Saturation: 98 %
Potassium: 3.7 mmol/L (ref 3.5–5.1)
Sodium: 143 mmol/L (ref 135–145)
TCO2: 25 mmol/L (ref 22–32)
pCO2 arterial: 43.2 mmHg (ref 32.0–48.0)
pH, Arterial: 7.342 — ABNORMAL LOW (ref 7.350–7.450)
pO2, Arterial: 115 mmHg — ABNORMAL HIGH (ref 83.0–108.0)

## 2021-02-11 SURGERY — RIGHT/LEFT HEART CATH AND CORONARY ANGIOGRAPHY
Anesthesia: LOCAL

## 2021-02-11 MED ORDER — SODIUM CHLORIDE 0.9 % WEIGHT BASED INFUSION: 1.0000 mL/kg/h | INTRAVENOUS | Status: DC

## 2021-02-11 MED ORDER — VERAPAMIL HCL 2.5 MG/ML IV SOLN
INTRAVENOUS | Status: AC
  Filled 2021-02-11: qty 2

## 2021-02-11 MED ORDER — SODIUM CHLORIDE 0.9% FLUSH: 3.0000 mL | INTRAVENOUS | Status: DC | PRN

## 2021-02-11 MED ORDER — VERAPAMIL HCL 2.5 MG/ML IV SOLN
INTRAVENOUS | Status: DC | PRN
  Administered 2021-02-11: 10 mL via INTRA_ARTERIAL

## 2021-02-11 MED ORDER — LIDOCAINE HCL (PF) 1 % IJ SOLN
INTRAMUSCULAR | Status: AC
  Filled 2021-02-11: qty 30

## 2021-02-11 MED ORDER — SODIUM CHLORIDE 0.9 % IV SOLN: 250.0000 mL | INTRAVENOUS | Status: DC | PRN

## 2021-02-11 MED ORDER — SODIUM CHLORIDE 0.9% FLUSH: 3.0000 mL | Freq: Two times a day (BID) | INTRAVENOUS | Status: DC

## 2021-02-11 MED ORDER — ACETAMINOPHEN 325 MG PO TABS: 650.0000 mg | ORAL_TABLET | ORAL | Status: DC | PRN

## 2021-02-11 MED ORDER — MIDAZOLAM HCL 2 MG/2ML IJ SOLN
INTRAMUSCULAR | Status: AC
  Filled 2021-02-11: qty 2

## 2021-02-11 MED ORDER — ONDANSETRON HCL 4 MG/2ML IJ SOLN: 4.0000 mg | Freq: Four times a day (QID) | INTRAMUSCULAR | Status: DC | PRN

## 2021-02-11 MED ORDER — SODIUM CHLORIDE 0.9 % WEIGHT BASED INFUSION
3.0000 mL/kg/h | INTRAVENOUS | Status: AC
  Administered 2021-02-11: 3 mL/kg/h via INTRAVENOUS

## 2021-02-11 MED ORDER — HEPARIN (PORCINE) IN NACL 1000-0.9 UT/500ML-% IV SOLN
INTRAVENOUS | Status: AC
  Filled 2021-02-11: qty 500

## 2021-02-11 MED ORDER — HEPARIN SODIUM (PORCINE) 1000 UNIT/ML IJ SOLN
INTRAMUSCULAR | Status: DC | PRN
  Administered 2021-02-11: 5000 [IU] via INTRAVENOUS

## 2021-02-11 MED ORDER — MIDAZOLAM HCL 2 MG/2ML IJ SOLN
INTRAMUSCULAR | Status: DC | PRN
  Administered 2021-02-11: 2 mg via INTRAVENOUS

## 2021-02-11 MED ORDER — ATORVASTATIN CALCIUM 80 MG PO TABS: 80.0000 mg | ORAL_TABLET | Freq: Every day | ORAL | 11 refills | Status: DC

## 2021-02-11 MED ORDER — LIDOCAINE HCL (PF) 1 % IJ SOLN
INTRAMUSCULAR | Status: DC | PRN
  Administered 2021-02-11 (×2): 2 mL via SUBCUTANEOUS

## 2021-02-11 MED ORDER — ASPIRIN 81 MG PO CHEW: 81.0000 mg | CHEWABLE_TABLET | ORAL | Status: DC

## 2021-02-11 MED ORDER — IOHEXOL 350 MG/ML SOLN
INTRAVENOUS | Status: DC | PRN
  Administered 2021-02-11: 70 mL via INTRA_ARTERIAL

## 2021-02-11 MED ORDER — FENTANYL CITRATE (PF) 100 MCG/2ML IJ SOLN
INTRAMUSCULAR | Status: AC
  Filled 2021-02-11: qty 2

## 2021-02-11 MED ORDER — HEPARIN SODIUM (PORCINE) 1000 UNIT/ML IJ SOLN
INTRAMUSCULAR | Status: AC
  Filled 2021-02-11: qty 1

## 2021-02-11 MED ORDER — FENTANYL CITRATE (PF) 100 MCG/2ML IJ SOLN
INTRAMUSCULAR | Status: DC | PRN
  Administered 2021-02-11: 25 ug via INTRAVENOUS

## 2021-02-11 MED ORDER — HEPARIN (PORCINE) IN NACL 1000-0.9 UT/500ML-% IV SOLN
INTRAVENOUS | Status: DC | PRN
  Administered 2021-02-11 (×2): 500 mL

## 2021-02-11 SURGICAL SUPPLY — 11 items

## 2021-02-11 NOTE — Telephone Encounter (Signed)
Patient's wife spoke with Katina Dung, RN on 10/18 regarding procedure scheduled for 10/20.

## 2021-02-11 NOTE — Discharge Instructions (Addendum)
Increase lipitor to 80 mg daily

## 2021-02-11 NOTE — Progress Notes (Signed)
[facility] consulted for outpatient CABG/AVR. [facility] office will call pt with an appointment.

## 2021-02-11 NOTE — Interval H&P Note (Signed)
History and Physical Interval Note:  02/11/2021 8:48 AM  Edward [name]  has presented today for surgery, with the diagnosis of chest pain.  The various methods of treatment have been discussed with the patient and family. After consideration of risks, benefits and other options for treatment, the patient has consented to  Procedure(s): RIGHT/LEFT HEART CATH AND CORONARY ANGIOGRAPHY (N/A) as a surgical intervention.  The patient's history has been reviewed, patient examined, no change in status, stable for surgery.  I have reviewed the patient's chart and labs.  Questions were answered to the patient's satisfaction.   Cath Lab Visit (complete for each Cath Lab visit)  Clinical Evaluation Leading to the Procedure:   ACS: No.  Non-ACS:    Anginal Classification: CCS II  Anti-ischemic medical therapy: Maximal Therapy (2 or more classes of medications)  Non-Invasive Test Results: Low-risk stress test findings: cardiac mortality <1%/year  Prior CABG: No previous CABG        Theron Arista [facility] 02/11/2021 8:48 AM

## 2021-02-12 ENCOUNTER — Encounter (HOSPITAL_COMMUNITY): Payer: Self-pay | Admitting: Cardiology

## 2021-02-12 DIAGNOSIS — G4733 Obstructive sleep apnea (adult) (pediatric): Secondary | ICD-10-CM | POA: Diagnosis not present

## 2021-02-17 NOTE — Progress Notes (Unsigned)
[address]       [place] [address]             [phone]        [name] [facility] Medical Record #[id number] Date of Birth: Feb 03, 1946  Referring: [place], Peter M, MD Primary Care: Edward Has, MD Primary Cardiologist:None  Chief Complaint:   No chief complaint on file.   History of Present Illness:     ***   Past Medical and Surgical History: Previous Chest Surgery: *** Previous Chest Radiation: *** Diabetes Mellitus: ***.  HbA1C *** Creatinine: 1.25  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 by echo 07/2017   Chronic kidney disease    kidney stones   Colon polyps    Diplopia 10/15/2015   Diverticulosis    Erectile dysfunction    GERD (gastroesophageal reflux disease)    with Barrett's esophagus   Gout    Hearing loss    hearing aides   HTN (hypertension)    Hypercholesteremia    Migraine headache    Myasthenia gravis (HCC)    Obesity    OSA (obstructive sleep apnea)    AHI 43/hr now on CPAP at 10cm H2O   Peripheral neuropathy 01/14/2019   Right foot drop 12/03/2018    Past Surgical History:  Procedure Laterality Date   laser assisted uvuloplasty     RIGHT/LEFT HEART CATH AND CORONARY ANGIOGRAPHY N/A 02/11/2021   Procedure: RIGHT/LEFT HEART CATH AND CORONARY ANGIOGRAPHY;  Surgeon: [place], Peter M, MD;  Location: [facility];  Service: Cardiovascular;  Laterality: N/A;    Social History: Support: ***  Social History   Tobacco Use  Smoking Status Never  Smokeless Tobacco Never    Social History   Substance and Sexual Activity  Alcohol Use Yes   Alcohol/week: 0.0 standard drinks   Comment: rare wine     No Known Allergies  Medications: Asprin: *** Statin: *** Beta Blocker: *** Ace Inhibitor: *** Anti-Coagulation: ***  Current Outpatient Medications  Medication Sig Dispense Refill    acetaminophen (TYLENOL) 500 MG tablet Take 1,000 mg by mouth every 6 (six) hours as needed for moderate pain or mild pain.     allopurinol (ZYLOPRIM) 300 MG tablet Take 300 mg by mouth daily.     amLODipine (NORVASC) 5 MG tablet Take 1 tablet (5 mg total) by mouth daily. 180 tablet 3   aspirin EC 81 MG tablet Take 81 mg by mouth daily. Swallow whole.     atorvastatin (LIPITOR) 80 MG tablet Take 1 tablet (80 mg total) by mouth daily. 30 tablet 11   Biotin 1000 MCG tablet Take 1,000 mcg by mouth daily.     cetirizine (ZYRTEC) 10 MG tablet Take 10 mg by mouth daily.     fish oil-omega-3 fatty acids 1000 MG capsule Take 1 g by mouth at bedtime.     isosorbide mononitrate (IMDUR) 30 MG 24 hr tablet Take 2 tablets (60 mg total) by mouth daily. 90 tablet 3   lidocaine (LIDODERM) 5 % Place 1 patch onto the skin daily. Remove & Discard patch within 12 hours or as directed by MD (Patient taking differently: Place 1 patch onto the skin daily as needed (pain). Remove & Discard patch within 12 hours or as directed by MD) 30 patch 1   losartan (COZAAR) 50  MG tablet Take 50 mg by mouth at bedtime.     metoprolol succinate (TOPROL-XL) 25 MG 24 hr tablet Take 1 tablet (25 mg total) by mouth daily. Take with or immediately following a meal. 90 tablet 3   Multiple Vitamin (MULTIVITAMIN) capsule Take 1 capsule by mouth daily.     nitroGLYCERIN (NITROSTAT) 0.4 MG SL tablet Place 1 tablet (0.4 mg total) under the tongue every 5 (five) minutes as needed. 25 tablet 3   pantoprazole (PROTONIX) 40 MG tablet Take 40 mg by mouth daily.     predniSONE (DELTASONE) 1 MG tablet Take 1 tablet (1 mg total) by mouth daily. (Patient taking differently: Take 1 mg by mouth daily. Take with 5 mg for a total of 6 mg daily) 90 tablet 2   predniSONE (DELTASONE) 5 MG tablet TAKE 1 TABLET BY MOUTH EVERY DAY (Patient taking differently: Take 5 mg by mouth daily with breakfast. Take with 1 mg for a total of 6 mg daily) 90 tablet 2    pyridostigmine (MESTINON) 60 MG tablet 1/2 tablet three times a day 135 tablet 3   traZODone (DESYREL) 50 MG tablet TAKE 1 TABLET BY MOUTH EVERYDAY AT BEDTIME (Patient taking differently: Take 50 mg by mouth at bedtime as needed for sleep.) 90 tablet 2   XARELTO 20 MG TABS tablet Take 20 mg by mouth daily.     No current facility-administered medications for this visit.    (Not in a hospital admission)   Family History  Problem Relation Age of Onset   Hypertension Mother    Heart attack Father    Heart disease Father    Hypertension Sister    Heart disease Brother    Hypertension Brother    Hypertension Brother    Anemia Brother    Cancer Brother      Review of Systems:   ROS    Physical Exam: There were no vitals taken for this visit. Physical Exam    Diagnostic Studies & Laboratory data:    Left Heart Catherization: *** Echo: *** EKG: *** I have independently reviewed the above radiologic studies and discussed with the patient   Recent Lab Findings: Lab Results  Component Value Date   WBC 9.6 02/05/2021   HGB 11.9 (L) 02/11/2021   HGB 11.6 (L) 02/11/2021   HCT 35.0 (L) 02/11/2021   HCT 34.0 (L) 02/11/2021   PLT 210 02/05/2021   GLUCOSE 140 (H) 02/10/2021   CHOL 163 11/09/2020   TRIG 170 (H) 11/09/2020   HDL 49 11/09/2020   LDLCALC 85 11/09/2020   ALT 19 11/09/2020   AST 30 11/09/2020   NA 143 02/11/2021   NA 142 02/11/2021   K 3.8 02/11/2021   K 3.7 02/11/2021   CL 106 02/10/2021   CREATININE 1.25 02/10/2021   BUN 30 (H) 02/10/2021   CO2 26 02/10/2021   TSH 1.500 10/15/2015      Assessment / Plan:   This is a 75 year old gentleman that [name] severe three-vessel coronary artery disease, 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  In regards to the coronary artery disease.  I personally reviewed the left heart cath.  He does have good distal targets on all 4 walls.  The right  coronary is completely occluded and fills from left to right collaterals, however there does appear to be a good target on the PDA.  On the lateral wall there are 2 good targets that  we will plan to bypass.  The LAD is also a good target, and there is potentially a second diagonal vessel that will require bypassing.  His echocardiogram was done in June of this year.  His valve area measured 1.02 cm, and his mean gradient was 12 mmHg.  His aortic root diameter was 3.4 cm.  Based off of his left heart cath he [name] a mean gradient of 22 mmHg, and this was done in October 2022.  In regards to his myasthenia he is on prednisone and Mestinon, and apparently only [name] ocular symptoms.  I did review his calcium scoring CT scan from August of this year, and it does not appear to show a thymoma.  It does not include the entire chest and thus a dedicated CT chest will be beneficial for further evaluation of this and also accurate measurement of his ascending aorta.  I will also reach out to his neurologist, because given the strong likelihood of requiring aortic valve replacement and CABG x4 could also potentially perform a thymectomy at the same time.  We will also need to ensure that he can come off of his prednisone perioperatively.     I  spent {CHL ONC TIME VISIT - FMBWG:[id number]} counseling the patient face to face.   Edward [name] 02/17/2021 2:08 PM

## 2021-02-19 ENCOUNTER — Other Ambulatory Visit: Payer: Self-pay | Admitting: *Deleted

## 2021-02-19 ENCOUNTER — Encounter: Payer: Self-pay | Admitting: *Deleted

## 2021-02-19 ENCOUNTER — Other Ambulatory Visit: Payer: Self-pay

## 2021-02-19 ENCOUNTER — Encounter: Payer: Medicare PPO | Admitting: Thoracic Surgery (Cardiothoracic Vascular Surgery)

## 2021-02-19 ENCOUNTER — Institutional Professional Consult (permissible substitution) (INDEPENDENT_AMBULATORY_CARE_PROVIDER_SITE_OTHER): Payer: Medicare PPO | Admitting: Thoracic Surgery (Cardiothoracic Vascular Surgery)

## 2021-02-19 VITALS — BP 117/56 | HR 55 | Resp 20 | Ht 69.0 in | Wt 208.0 lb

## 2021-02-19 DIAGNOSIS — I2510 Atherosclerotic heart disease of native coronary artery without angina pectoris: Secondary | ICD-10-CM | POA: Diagnosis not present

## 2021-02-19 DIAGNOSIS — I350 Nonrheumatic aortic (valve) stenosis: Secondary | ICD-10-CM

## 2021-02-19 NOTE — H&P (View-Only) (Signed)
[address]       [place] [address]             [phone]        SUEO CULLEN [facility] Medical Record #[id number] Date of Birth: Apr 25, 1946  Referring: [place], Peter M, MD Primary Care: Farris Has, MD Primary Cardiologist:None  Chief Complaint:    Chief Complaint  Patient presents with   Coronary Artery Disease    Surgical consult, Cardiac Cath 02/11/21, ECHO 10/14/20, Cardiac CT 11/30/20    History of Present Illness:     75 year old male referred for surgical evaluation of severe three-vessel coronary artery disease moderate aortic stenosis due to bicuspid valve.  He also has a history of myasthenia gravis with mostly ocular symptoms.  He has had some progressive dyspnea, and underwent a left heart catheterization which identified the disease.  During the valve analysis is a gradients were consistent with moderate aortic stenosis.  In regards to his myasthenia he is on Mestinon as well as 6 mg of prednisone.  He is able to self titrate this based off of his symptoms.   Past Medical and Surgical History: Previous Chest Surgery: No Previous Chest Radiation: No Diabetes Mellitus: No.  HbA1C pending Creatinine: 1.25  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 by echo 07/2017   Chronic kidney disease    kidney stones   Colon polyps    Diplopia 10/15/2015   Diverticulosis    Erectile dysfunction    GERD (gastroesophageal reflux disease)    with Barrett's esophagus   Gout    Hearing loss    hearing aides   HTN (hypertension)    Hypercholesteremia    Migraine headache    Myasthenia gravis (HCC)    Obesity    OSA (obstructive sleep apnea)    AHI 43/hr now on CPAP at 10cm H2O   Peripheral neuropathy 01/14/2019   Right foot drop 12/03/2018    Past Surgical History:  Procedure Laterality Date   laser assisted uvuloplasty      RIGHT/LEFT HEART CATH AND CORONARY ANGIOGRAPHY N/A 02/11/2021   Procedure: RIGHT/LEFT HEART CATH AND CORONARY ANGIOGRAPHY;  Surgeon: [place], Peter M, MD;  Location: [facility];  Service: Cardiovascular;  Laterality: N/A;    Social History: Support: Lives with his wife  Social History   Tobacco Use  Smoking Status Never  Smokeless Tobacco Never    Social History   Substance and Sexual Activity  Alcohol Use Yes   Alcohol/week: 0.0 standard drinks   Comment: rare wine     No Known Allergies   Current Outpatient Medications  Medication Sig Dispense Refill   acetaminophen (TYLENOL) 500 MG tablet Take 1,000 mg by mouth every 6 (six) hours as needed for moderate pain or mild pain.     allopurinol (ZYLOPRIM) 300 MG tablet Take 300 mg by mouth daily.     aspirin EC 81 MG tablet Take 81 mg by mouth daily. Swallow whole.     atorvastatin (LIPITOR) 80 MG tablet Take 1 tablet (80 mg total) by mouth daily. 30 tablet 11   Biotin 1000 MCG tablet Take 1,000 mcg by mouth daily.     cetirizine (ZYRTEC) 10 MG tablet Take 10 mg by mouth daily.     fish oil-omega-3 fatty acids 1000 MG capsule Take 1 g by mouth  at bedtime.     isosorbide mononitrate (IMDUR) 30 MG 24 hr tablet Take 2 tablets (60 mg total) by mouth daily. 90 tablet 3   lidocaine (LIDODERM) 5 % Place 1 patch onto the skin daily. Remove & Discard patch within 12 hours or as directed by MD (Patient taking differently: Place 1 patch onto the skin daily as needed (pain). Remove & Discard patch within 12 hours or as directed by MD) 30 patch 1   losartan (COZAAR) 50 MG tablet Take 50 mg by mouth at bedtime.     metoprolol succinate (TOPROL-XL) 25 MG 24 hr tablet Take 1 tablet (25 mg total) by mouth daily. Take with or immediately following a meal. 90 tablet 3   Multiple Vitamin (MULTIVITAMIN) capsule Take 1 capsule by mouth daily.     nitroGLYCERIN (NITROSTAT) 0.4 MG SL tablet Place 1 tablet (0.4 mg total) under the tongue every 5  (five) minutes as needed. 25 tablet 3   pantoprazole (PROTONIX) 40 MG tablet Take 40 mg by mouth daily.     predniSONE (DELTASONE) 1 MG tablet Take 1 tablet (1 mg total) by mouth daily. (Patient taking differently: Take 1 mg by mouth daily. Take with 5 mg for a total of 6 mg daily) 90 tablet 2   predniSONE (DELTASONE) 5 MG tablet TAKE 1 TABLET BY MOUTH EVERY DAY (Patient taking differently: Take 5 mg by mouth daily with breakfast. Take with 1 mg for a total of 6 mg daily) 90 tablet 2   pyridostigmine (MESTINON) 60 MG tablet 1/2 tablet three times a day 135 tablet 3   traZODone (DESYREL) 50 MG tablet TAKE 1 TABLET BY MOUTH EVERYDAY AT BEDTIME (Patient taking differently: Take 50 mg by mouth at bedtime as needed for sleep.) 90 tablet 2   XARELTO 20 MG TABS tablet Take 20 mg by mouth daily.     amLODipine (NORVASC) 5 MG tablet Take 1 tablet (5 mg total) by mouth daily. 180 tablet 3   No current facility-administered medications for this visit.    (Not in a hospital admission)   Family History  Problem Relation Age of Onset   Hypertension Mother    Heart attack Father    Heart disease Father    Hypertension Sister    Heart disease Brother    Hypertension Brother    Hypertension Brother    Anemia Brother    Cancer Brother      Review of Systems:   Review of Systems  Constitutional:  Positive for malaise/fatigue.  Respiratory:  Positive for shortness of breath.   Cardiovascular:  Positive for chest pain and leg swelling. Negative for palpitations.  Gastrointestinal:  Positive for heartburn.     Physical Exam: BP (!) 117/56   Pulse (!) 55   Resp 20   Ht 5\' 9"  (1.753 m)   Wt 208 lb (94.3 kg)   SpO2 94% Comment: RA  BMI 30.72 kg/m  Physical Exam Constitutional:      General: He is not in acute distress.    Appearance: Normal appearance. He is obese. He is not ill-appearing.  HENT:     Head: Normocephalic and atraumatic.  Cardiovascular:     Rate and Rhythm: Bradycardia  present.  Pulmonary:     Effort: Pulmonary effort is normal. No respiratory distress.  Musculoskeletal:        General: Normal range of motion.     Cervical back: Normal range of motion.  Neurological:     Mental Status:  He is alert.      Diagnostic Studies & Laboratory data:    Left Heart Catherization: Severe three-vessel coronary artery disease with good distal targets. Echo: Preserved biventricular function.  Calcified aortic valve.  Mild to moderate aortic stenosis. EKG: Sinus with first-degree AV block I have independently reviewed the above radiologic studies and discussed with the patient   Recent Lab Findings: Lab Results  Component Value Date   WBC 9.6 02/05/2021   HGB 11.9 (L) 02/11/2021   HGB 11.6 (L) 02/11/2021   HCT 35.0 (L) 02/11/2021   HCT 34.0 (L) 02/11/2021   PLT 210 02/05/2021   GLUCOSE 140 (H) 02/10/2021   CHOL 163 11/09/2020   TRIG 170 (H) 11/09/2020   HDL 49 11/09/2020   LDLCALC 85 11/09/2020   ALT 19 11/09/2020   AST 30 11/09/2020   NA 143 02/11/2021   NA 142 02/11/2021   K 3.8 02/11/2021   K 3.7 02/11/2021   CL 106 02/10/2021   CREATININE 1.25 02/10/2021   BUN 30 (H) 02/10/2021   CO2 26 02/10/2021   TSH 1.500 10/15/2015      Assessment / Plan:   This is a 75 year old gentleman that has severe three-vessel coronary artery disease, 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  In regards to the coronary artery disease.  I personally reviewed the left heart cath.  He does have good distal targets on all 3 walls.  The right coronary is completely occluded and fills from left to right collaterals, however there does appear to be a good target on the PDA.  On the lateral wall there are 2 good targets that we will plan to bypass.  The LAD is also a good target, and there is potentially a second diagonal vessel that will require bypassing.  His echocardiogram was done in June of this year.   His valve area measured 1.02 cm, and his mean gradient was 12 mmHg.  His aortic root diameter was 3.4 cm.  Based off of his left heart cath he has a mean gradient of 22 mmHg, and this was done in October 2022.  In regards to his myasthenia he is on prednisone and Mestinon, and apparently only has ocular symptoms.  I did review his calcium scoring CT scan from August of this year, and it does not appear to show a thymoma.  I have discussed this case with his neurologist, there is no benefit of performing a thymectomy in the setting of ocular symptoms.  Will need to make plans to wean his prednisone prior to surgery.  He will also require stress test steroids in the perioperative period.  He is agreeable to proceed with a bioprosthetic aortic valve replacement along with a CABG.  He is tentatively scheduled for November 9.  His Xarelto will need to be held for 3 days prior to surgery.  He also will need dental clearance.    I  spent 55 minutes counseling the patient face to face.   Corliss Skains 02/19/2021 4:46 PM          CPAP Hearing aids

## 2021-02-19 NOTE — Progress Notes (Addendum)
    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  He also has a history of myasthenia gravis with mostly ocular symptoms.  He has had some progressive dyspnea, and underwent a left heart catheterization which identified the disease.  During the valve analysis is a gradients were consistent with moderate aortic stenosis.  In regards to his myasthenia he is on Mestinon as well as 6 mg of prednisone.  He is able to self titrate this based off of his symptoms.   Past Medical and Surgical History: Previous Chest Surgery: No Previous Chest Radiation: No Diabetes Mellitus: No.  HbA1C pending Creatinine: 1.25  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 by echo 07/2017   Chronic kidney disease    kidney stones   Colon polyps    Diplopia 10/15/2015   Diverticulosis    Erectile dysfunction    GERD (gastroesophageal reflux disease)    with Barrett's esophagus   Gout    Hearing loss    hearing aides   HTN (hypertension)    Hypercholesteremia    Migraine headache    Myasthenia gravis (HCC)    Obesity    OSA (obstructive sleep apnea)    AHI 43/hr now on CPAP at 10cm H2O   Peripheral neuropathy 01/14/2019   Right foot drop 12/03/2018    Past Surgical History:  Procedure Laterality Date   laser assisted uvuloplasty      RIGHT/LEFT HEART CATH AND CORONARY ANGIOGRAPHY N/A 02/11/2021   Procedure: RIGHT/LEFT HEART CATH AND CORONARY ANGIOGRAPHY;  Surgeon: Jordan, Peter M, MD;  Location: [facility];  Service: Cardiovascular;  Laterality: N/A;    Social History: Support: Lives with his wife  Social History   Tobacco Use  Smoking Status Never  Smokeless Tobacco Never    Social History   Substance and Sexual Activity  Alcohol Use Yes   Alcohol/week: 0.0 standard drinks   Comment: rare wine     No Known Allergies   Current Outpatient Medications  Medication Sig Dispense Refill   acetaminophen (TYLENOL) 500 MG tablet Take 1,000 mg by mouth every 6 (six) hours as needed for moderate pain or mild pain.     allopurinol (ZYLOPRIM) 300 MG tablet Take 300 mg by mouth daily.     aspirin EC 81 MG tablet Take 81 mg by mouth daily. Swallow whole.     atorvastatin (LIPITOR) 80 MG tablet Take 1 tablet (80 mg total) by mouth daily. 30 tablet 11   Biotin 1000 MCG tablet Take 1,000 mcg by mouth daily.     cetirizine (ZYRTEC) 10 MG tablet Take 10 mg by mouth daily.     fish oil-omega-3 fatty acids 1000 MG capsule Take 1 g by mouth   at bedtime.     isosorbide mononitrate (IMDUR) 30 MG 24 hr tablet Take 2 tablets (60 mg total) by mouth daily. 90 tablet 3   lidocaine (LIDODERM) 5 % Place 1 patch onto the skin daily. Remove & Discard patch within 12 hours or as directed by MD (Patient taking differently: Place 1 patch onto the skin daily as needed (pain). Remove & Discard patch within 12 hours or as directed by MD) 30 patch 1   losartan (COZAAR) 50 MG tablet Take 50 mg by mouth at bedtime.     metoprolol succinate (TOPROL-XL) 25 MG 24 hr tablet Take 1 tablet (25 mg total) by mouth daily. Take with or immediately following a meal. 90 tablet 3   Multiple Vitamin (MULTIVITAMIN) capsule Take 1 capsule by mouth daily.     nitroGLYCERIN (NITROSTAT) 0.4 MG SL tablet Place 1 tablet (0.4 mg total) under the tongue every 5  (five) minutes as needed. 25 tablet 3   pantoprazole (PROTONIX) 40 MG tablet Take 40 mg by mouth daily.     predniSONE (DELTASONE) 1 MG tablet Take 1 tablet (1 mg total) by mouth daily. (Patient taking differently: Take 1 mg by mouth daily. Take with 5 mg for a total of 6 mg daily) 90 tablet 2   predniSONE (DELTASONE) 5 MG tablet TAKE 1 TABLET BY MOUTH EVERY DAY (Patient taking differently: Take 5 mg by mouth daily with breakfast. Take with 1 mg for a total of 6 mg daily) 90 tablet 2   pyridostigmine (MESTINON) 60 MG tablet 1/2 tablet three times a day 135 tablet 3   traZODone (DESYREL) 50 MG tablet TAKE 1 TABLET BY MOUTH EVERYDAY AT BEDTIME (Patient taking differently: Take 50 mg by mouth at bedtime as needed for sleep.) 90 tablet 2   XARELTO 20 MG TABS tablet Take 20 mg by mouth daily.     amLODipine (NORVASC) 5 MG tablet Take 1 tablet (5 mg total) by mouth daily. 180 tablet 3   No current facility-administered medications for this visit.    (Not in a hospital admission)   Family History  Problem Relation Age of Onset   Hypertension Mother    Heart attack Father    Heart disease Father    Hypertension Sister    Heart disease Brother    Hypertension Brother    Hypertension Brother    Anemia Brother    Cancer Brother      Review of Systems:   Review of Systems  Constitutional:  Positive for malaise/fatigue.  Respiratory:  Positive for shortness of breath.   Cardiovascular:  Positive for chest pain and leg swelling. Negative for palpitations.  Gastrointestinal:  Positive for heartburn.     Physical Exam: BP (!) 117/56   Pulse (!) 55   Resp 20   Ht 5\' 9"  (1.753 m)   Wt 208 lb (94.3 kg)   SpO2 94% Comment: RA  BMI 30.72 kg/m  Physical Exam Constitutional:      General: He is not in acute distress.    Appearance: Normal appearance. He is obese. He is not ill-appearing.  HENT:     Head: Normocephalic and atraumatic.  Cardiovascular:     Rate and Rhythm: Bradycardia  present.  Pulmonary:     Effort: Pulmonary effort is normal. No respiratory distress.  Musculoskeletal:        General: Normal range of motion.     Cervical back: Normal range of motion.  Neurological:     Mental Status:  He is alert.      Diagnostic Studies & Laboratory data:    Left Heart Catherization: Severe three-vessel coronary artery disease with good distal targets. Echo: Preserved biventricular function.  Calcified aortic valve.  Mild to moderate aortic stenosis. EKG: Sinus with first-degree AV block I have independently reviewed the above radiologic studies and discussed with the patient   Recent Lab Findings: Lab Results  Component Value Date   WBC 9.6 02/05/2021   HGB 11.9 (L) 02/11/2021   HGB 11.6 (L) 02/11/2021   HCT 35.0 (L) 02/11/2021   HCT 34.0 (L) 02/11/2021   PLT 210 02/05/2021   GLUCOSE 140 (H) 02/10/2021   CHOL 163 11/09/2020   TRIG 170 (H) 11/09/2020   HDL 49 11/09/2020   LDLCALC 85 11/09/2020   ALT 19 11/09/2020   AST 30 11/09/2020   NA 143 02/11/2021   NA 142 02/11/2021   K 3.8 02/11/2021   K 3.7 02/11/2021   CL 106 02/10/2021   CREATININE 1.25 02/10/2021   BUN 30 (H) 02/10/2021   CO2 26 02/10/2021   TSH 1.500 10/15/2015      Assessment / Plan:   This is a 75-year-old gentleman that has severe three-vessel coronary artery disease, 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  In regards to the coronary artery disease.  I personally reviewed the left heart cath.  He does have good distal targets on all 3 walls.  The right coronary is completely occluded and fills from left to right collaterals, however there does appear to be a good target on the PDA.  On the lateral wall there are 2 good targets that we will plan to bypass.  The LAD is also a good target, and there is potentially a second diagonal vessel that will require bypassing.  His echocardiogram was done in June of this year.   His valve area measured 1.02 cm, and his mean gradient was 12 mmHg.  His aortic root diameter was 3.4 cm.  Based off of his left heart cath he has a mean gradient of 22 mmHg, and this was done in October 2022.  In regards to his myasthenia he is on prednisone and Mestinon, and apparently only has ocular symptoms.  I did review his calcium scoring CT scan from August of this year, and it does not appear to show a thymoma.  I have discussed this case with his neurologist, there is no benefit of performing a thymectomy in the setting of ocular symptoms.  Will need to make plans to wean his prednisone prior to surgery.  He will also require stress test steroids in the perioperative period.  He is agreeable to proceed with a bioprosthetic aortic valve replacement along with a CABG.  He is tentatively scheduled for November 9.  His Xarelto will need to be held for 3 days prior to surgery.  He also will need dental clearance.    I  spent 55 minutes counseling the patient face to face.   Khale Nigh O Leani Myron 02/19/2021 4:46 PM          CPAP Hearing aids 

## 2021-02-27 NOTE — Pre-Procedure Instructions (Signed)
Edward [name]  02/27/2021     Your procedure is scheduled on Wed., Nov. 9, 2022 from 8:30AM-3:09PM.  Report to [facility] Entrance "A" at 6:30AM  Call this number if you have problems the morning of surgery:  [phone]   Remember:  Do not eat or drink after midnight on Nov. 8th    Take these medicines the morning of surgery with A SIP OF WATER: Allopurinol (ZYLOPRIM) AmLODipine (NORVASC) Atorvastatin (LIPITOR) Cetirizine (ZYRTEC) Isosorbide mononitrate (IMDUR)  Metoprolol succinate (TOPROL-XL) Pantoprazole (PROTONIX PredniSONE (DELTASONE)  If Needed: Acetaminophen (TYLENOL) NitroGLYCERIN (NITROSTAT)  Take last dose of Aspirin on Tues. 03/02/21 Take last dose of Xarelto on Sat. 02/27/21   As of today, STOP taking all Other Aspirin containing products, Vitamins, Fish oils, and Herbal medications. Also stop all NSAIDS i.e. Advil, Ibuprofen, Motrin, Aleve, Anaprox, Naproxen, BC, Goody Powders, and all Supplements.    No Smoking of any kind, Tobacco/Vaping, or Alcohol products 24 hours prior to your procedure. If you use a Cpap at night, you may bring all equipment for your overnight stay.    Day of Surgery:  Do not wear jewelry.  Do not wear lotions, powders, or perfumes/colognes, or deodorant.  Do not shave 48 hours prior to surgery.  Men may shave face and neck.  Do not bring valuables to the hospital.  [facility] is not responsible for any belongings or valuables.  Contacts, dentures or bridgework may not be worn into surgery.    For patients admitted to the hospital, discharge time will be determined by your treatment team.  Patients discharged the day of surgery will not be allowed to drive home, and someone age 84 and over needs to stay with them for 24 hours.  Oral Hygiene is also important to reduce your risk of infection.  Remember - BRUSH YOUR TEETH THE MORNING OF SURGERY WITH YOUR REGULAR TOOTHPASTE  Special instructions:  [place]- Preparing  For Surgery  Before surgery, you can play an important role. Because skin is not sterile, your skin needs to be as free of germs as possible. You can reduce the number of germs on your skin by washing with CHG (chlorahexidine gluconate) Soap before surgery.  CHG is an antiseptic cleaner which kills germs and bonds with the skin to continue killing germs even after washing.    Please do not use if you have an allergy to CHG or antibacterial soaps. If your skin becomes reddened/irritated stop using the CHG.  Do not shave (including legs and underarms) for at least 48 hours prior to first CHG shower. It is OK to shave your face.  Please follow these instructions carefully.   Shower the NIGHT BEFORE SURGERY and the MORNING OF SURGERY with CHG.   If you chose to wash your hair, wash your hair first as usual with your normal shampoo.  After you shampoo, rinse your hair and body thoroughly to remove the shampoo.  Use CHG as you would any other liquid soap. You can apply CHG directly to the skin and wash gently with a scrungie or a clean washcloth.   Apply the CHG Soap to your body ONLY FROM THE NECK DOWN.  Do not use on open wounds or open sores. Avoid contact with your eyes, ears, mouth and genitals (private parts). Wash Face and genitals (private parts)  with your normal soap.  Wash thoroughly, paying special attention to the area where your surgery will be performed.  Thoroughly rinse your body with  warm water from the neck down.  DO NOT shower/wash with your normal soap after using and rinsing off the CHG Soap.  Pat yourself dry with a CLEAN TOWEL.  Wear CLEAN PAJAMAS to bed the night before surgery, wear comfortable clothes the morning of surgery  Place CLEAN SHEETS on your bed the night of your first shower and DO NOT SLEEP WITH PETS.  Reminders: Do not apply any deodorants/lotions.  Please wear clean clothes to the hospital/surgery center.   Remember to brush your teeth WITH YOUR  REGULAR TOOTHPASTE.  Please read over the following fact sheets that you were given.

## 2021-03-01 ENCOUNTER — Other Ambulatory Visit: Payer: Self-pay

## 2021-03-01 ENCOUNTER — Ambulatory Visit (HOSPITAL_COMMUNITY)
Admission: RE | Admit: 2021-03-01 | Discharge: 2021-03-01 | Disposition: A | Discharge status: Home / Self Care | Payer: Medicare PPO | Source: Ambulatory Visit | Attending: Thoracic Surgery (Cardiothoracic Vascular Surgery) | Admitting: Thoracic Surgery (Cardiothoracic Vascular Surgery)

## 2021-03-01 ENCOUNTER — Ambulatory Visit (HOSPITAL_BASED_OUTPATIENT_CLINIC_OR_DEPARTMENT_OTHER)
Admission: RE | Admit: 2021-03-01 | Discharge: 2021-03-01 | Disposition: A | Discharge status: Home / Self Care | Payer: Medicare PPO | Source: Ambulatory Visit | Attending: Thoracic Surgery (Cardiothoracic Vascular Surgery) | Admitting: Thoracic Surgery (Cardiothoracic Vascular Surgery)

## 2021-03-01 ENCOUNTER — Encounter (HOSPITAL_COMMUNITY)
Admission: RE | Admit: 2021-03-01 | Discharge: 2021-03-01 | Disposition: A | Discharge status: Home / Self Care | Payer: Medicare PPO | Source: Ambulatory Visit | Attending: Thoracic Surgery (Cardiothoracic Vascular Surgery) | Admitting: Thoracic Surgery (Cardiothoracic Vascular Surgery)

## 2021-03-01 ENCOUNTER — Encounter (HOSPITAL_COMMUNITY): Payer: Self-pay

## 2021-03-01 DIAGNOSIS — J95821 Acute postprocedural respiratory failure: Secondary | ICD-10-CM | POA: Diagnosis not present

## 2021-03-01 DIAGNOSIS — Y838 Other surgical procedures as the cause of abnormal reaction of the patient, or of later complication, without mention of misadventure at the time of the procedure: Secondary | ICD-10-CM | POA: Diagnosis not present

## 2021-03-01 DIAGNOSIS — R739 Hyperglycemia, unspecified: Secondary | ICD-10-CM | POA: Diagnosis not present

## 2021-03-01 DIAGNOSIS — I700 Atherosclerosis of aorta: Secondary | ICD-10-CM | POA: Diagnosis not present

## 2021-03-01 DIAGNOSIS — D6959 Other secondary thrombocytopenia: Secondary | ICD-10-CM | POA: Diagnosis not present

## 2021-03-01 DIAGNOSIS — N189 Chronic kidney disease, unspecified: Secondary | ICD-10-CM | POA: Diagnosis present

## 2021-03-01 DIAGNOSIS — R0602 Shortness of breath: Secondary | ICD-10-CM | POA: Diagnosis not present

## 2021-03-01 DIAGNOSIS — G4733 Obstructive sleep apnea (adult) (pediatric): Secondary | ICD-10-CM | POA: Diagnosis present

## 2021-03-01 DIAGNOSIS — D62 Acute posthemorrhagic anemia: Secondary | ICD-10-CM | POA: Diagnosis not present

## 2021-03-01 DIAGNOSIS — E8770 Fluid overload, unspecified: Secondary | ICD-10-CM | POA: Diagnosis not present

## 2021-03-01 DIAGNOSIS — Z6833 Body mass index (BMI) 33.0-33.9, adult: Secondary | ICD-10-CM | POA: Diagnosis not present

## 2021-03-01 DIAGNOSIS — Z01812 Encounter for preprocedural laboratory examination: Secondary | ICD-10-CM | POA: Insufficient documentation

## 2021-03-01 DIAGNOSIS — I517 Cardiomegaly: Secondary | ICD-10-CM | POA: Diagnosis not present

## 2021-03-01 DIAGNOSIS — E669 Obesity, unspecified: Secondary | ICD-10-CM | POA: Diagnosis present

## 2021-03-01 DIAGNOSIS — I10 Essential (primary) hypertension: Secondary | ICD-10-CM | POA: Diagnosis not present

## 2021-03-01 DIAGNOSIS — I2510 Atherosclerotic heart disease of native coronary artery without angina pectoris: Secondary | ICD-10-CM | POA: Diagnosis present

## 2021-03-01 DIAGNOSIS — I350 Nonrheumatic aortic (valve) stenosis: Secondary | ICD-10-CM

## 2021-03-01 DIAGNOSIS — G7000 Myasthenia gravis without (acute) exacerbation: Secondary | ICD-10-CM | POA: Diagnosis present

## 2021-03-01 DIAGNOSIS — Z01818 Encounter for other preprocedural examination: Secondary | ICD-10-CM | POA: Diagnosis not present

## 2021-03-01 DIAGNOSIS — R578 Other shock: Secondary | ICD-10-CM | POA: Diagnosis not present

## 2021-03-01 DIAGNOSIS — D696 Thrombocytopenia, unspecified: Secondary | ICD-10-CM | POA: Diagnosis not present

## 2021-03-01 DIAGNOSIS — Q231 Congenital insufficiency of aortic valve: Secondary | ICD-10-CM | POA: Diagnosis not present

## 2021-03-01 DIAGNOSIS — J90 Pleural effusion, not elsewhere classified: Secondary | ICD-10-CM | POA: Diagnosis not present

## 2021-03-01 DIAGNOSIS — Z951 Presence of aortocoronary bypass graft: Secondary | ICD-10-CM | POA: Diagnosis not present

## 2021-03-01 DIAGNOSIS — I358 Other nonrheumatic aortic valve disorders: Secondary | ICD-10-CM | POA: Diagnosis not present

## 2021-03-01 DIAGNOSIS — J9811 Atelectasis: Secondary | ICD-10-CM | POA: Diagnosis present

## 2021-03-01 DIAGNOSIS — I25119 Atherosclerotic heart disease of native coronary artery with unspecified angina pectoris: Secondary | ICD-10-CM | POA: Diagnosis not present

## 2021-03-01 DIAGNOSIS — Z8709 Personal history of other diseases of the respiratory system: Secondary | ICD-10-CM | POA: Diagnosis not present

## 2021-03-01 DIAGNOSIS — Z20822 Contact with and (suspected) exposure to covid-19: Secondary | ICD-10-CM | POA: Insufficient documentation

## 2021-03-01 DIAGNOSIS — D689 Coagulation defect, unspecified: Secondary | ICD-10-CM | POA: Diagnosis not present

## 2021-03-01 DIAGNOSIS — J9601 Acute respiratory failure with hypoxia: Secondary | ICD-10-CM | POA: Diagnosis not present

## 2021-03-01 DIAGNOSIS — Y92239 Unspecified place in hospital as the place of occurrence of the external cause: Secondary | ICD-10-CM | POA: Diagnosis not present

## 2021-03-01 DIAGNOSIS — E7800 Pure hypercholesterolemia, unspecified: Secondary | ICD-10-CM | POA: Diagnosis present

## 2021-03-01 DIAGNOSIS — J9602 Acute respiratory failure with hypercapnia: Secondary | ICD-10-CM | POA: Diagnosis not present

## 2021-03-01 DIAGNOSIS — Z4682 Encounter for fitting and adjustment of non-vascular catheter: Secondary | ICD-10-CM | POA: Diagnosis not present

## 2021-03-01 DIAGNOSIS — I129 Hypertensive chronic kidney disease with stage 1 through stage 4 chronic kidney disease, or unspecified chronic kidney disease: Secondary | ICD-10-CM | POA: Diagnosis present

## 2021-03-01 DIAGNOSIS — R570 Cardiogenic shock: Secondary | ICD-10-CM | POA: Diagnosis not present

## 2021-03-01 DIAGNOSIS — G629 Polyneuropathy, unspecified: Secondary | ICD-10-CM | POA: Diagnosis present

## 2021-03-01 DIAGNOSIS — H9190 Unspecified hearing loss, unspecified ear: Secondary | ICD-10-CM | POA: Diagnosis present

## 2021-03-01 DIAGNOSIS — I97190 Other postprocedural cardiac functional disturbances following cardiac surgery: Secondary | ICD-10-CM | POA: Diagnosis not present

## 2021-03-01 DIAGNOSIS — Z86718 Personal history of other venous thrombosis and embolism: Secondary | ICD-10-CM | POA: Diagnosis not present

## 2021-03-01 DIAGNOSIS — I080 Rheumatic disorders of both mitral and aortic valves: Secondary | ICD-10-CM | POA: Diagnosis not present

## 2021-03-01 DIAGNOSIS — I480 Paroxysmal atrial fibrillation: Secondary | ICD-10-CM | POA: Diagnosis not present

## 2021-03-01 DIAGNOSIS — K219 Gastro-esophageal reflux disease without esophagitis: Secondary | ICD-10-CM | POA: Diagnosis present

## 2021-03-01 DIAGNOSIS — T8111XA Postprocedural  cardiogenic shock, initial encounter: Secondary | ICD-10-CM | POA: Diagnosis not present

## 2021-03-01 HISTORY — DX: Acute embolism and thrombosis of unspecified deep veins of unspecified lower extremity: I82.409

## 2021-03-01 LAB — BLOOD GAS, ARTERIAL
Acid-base deficit: 1.9 mmol/L (ref 0.0–2.0)
Bicarbonate: 22.3 mmol/L (ref 20.0–28.0)
FIO2: 21
O2 Saturation: 97.5 %
Patient temperature: 37
pCO2 arterial: 38 mmHg (ref 32.0–48.0)
pH, Arterial: 7.387 (ref 7.350–7.450)
pO2, Arterial: 94.6 mmHg (ref 83.0–108.0)

## 2021-03-01 LAB — COMPREHENSIVE METABOLIC PANEL
ALT: 25 U/L (ref 0–44)
AST: 34 U/L (ref 15–41)
Albumin: 3.5 g/dL (ref 3.5–5.0)
Alkaline Phosphatase: 45 U/L (ref 38–126)
Anion gap: 10 (ref 5–15)
BUN: 26 mg/dL — ABNORMAL HIGH (ref 8–23)
CO2: 20 mmol/L — ABNORMAL LOW (ref 22–32)
Calcium: 9 mg/dL (ref 8.9–10.3)
Chloride: 108 mmol/L (ref 98–111)
Creatinine, Ser: 1.31 mg/dL — ABNORMAL HIGH (ref 0.61–1.24)
GFR, Estimated: 57 mL/min — ABNORMAL LOW (ref >60)
Glucose, Bld: 142 mg/dL — ABNORMAL HIGH (ref 70–99)
Potassium: 4.1 mmol/L (ref 3.5–5.1)
Sodium: 138 mmol/L (ref 135–145)
Total Bilirubin: 1 mg/dL (ref 0.3–1.2)
Total Protein: 6.4 g/dL — ABNORMAL LOW (ref 6.5–8.1)

## 2021-03-01 LAB — PROTIME-INR
INR: 1.1 (ref 0.8–1.2)
Prothrombin Time: 13.9 seconds (ref 11.4–15.2)

## 2021-03-01 LAB — URINALYSIS, ROUTINE W REFLEX MICROSCOPIC
Bilirubin Urine: NEGATIVE
Glucose, UA: NEGATIVE mg/dL
Hgb urine dipstick: NEGATIVE
Ketones, ur: NEGATIVE mg/dL
Leukocytes,Ua: NEGATIVE
Nitrite: NEGATIVE
Protein, ur: NEGATIVE mg/dL
Specific Gravity, Urine: 1.021 (ref 1.005–1.030)
pH: 5 (ref 5.0–8.0)

## 2021-03-01 LAB — SURGICAL PCR SCREEN
MRSA, PCR: NEGATIVE
Staphylococcus aureus: NEGATIVE

## 2021-03-01 LAB — CBC
HCT: 39.9 % (ref 39.0–52.0)
Hemoglobin: 12.8 g/dL — ABNORMAL LOW (ref 13.0–17.0)
MCH: 29.4 pg (ref 26.0–34.0)
MCHC: 32.1 g/dL (ref 30.0–36.0)
MCV: 91.5 fL (ref 80.0–100.0)
Platelets: 187 10*3/uL (ref 150–400)
RBC: 4.36 MIL/uL (ref 4.22–5.81)
RDW: 14.6 % (ref 11.5–15.5)
WBC: 9.1 10*3/uL (ref 4.0–10.5)
nRBC: 0 % (ref 0.0–0.2)

## 2021-03-01 LAB — APTT: aPTT: 29 seconds (ref 24–36)

## 2021-03-01 LAB — HEMOGLOBIN A1C
Hgb A1c MFr Bld: 6.2 % — ABNORMAL HIGH (ref 4.8–5.6)
Mean Plasma Glucose: 131.24 mg/dL

## 2021-03-01 NOTE — Progress Notes (Signed)
Pre cabg has been completed.   Preliminary results in CV Proc.   Aundra Millet Lawrance Wiedemann 03/01/2021 9:52 AM

## 2021-03-01 NOTE — Progress Notes (Signed)
PCP - Dr. Farris Has Cardiologist - Dr. Epifanio Lesches, per pt  PPM/ICD - denies   Chest x-ray - 03/01/21 at PAT  EKG - 02/11/21 Stress Test - 11/05/20 ECHO - 10/14/20 Cardiac Cath - 02/11/21  Sleep Study - 10/11/2005 CPAP - yes  DM- denies  Blood Thinner Instructions: pt stopped xarelto, per instructions on 11/6 Aspirin Instructions: pt instructed to continue ASA thru the day before surgery  ERAS Protcol - no, NPO   COVID TEST- 03/01/21 at PAT   Anesthesia review: yes, cardiac hx  Patient denies shortness of breath, fever, cough and chest pain at PAT appointment   All instructions explained to the patient, with a verbal understanding of the material. Patient agrees to go over the instructions while at home for a better understanding. Patient also instructed to wear a mask in public after being tested for COVID-19. The opportunity to ask questions was provided.

## 2021-03-02 ENCOUNTER — Encounter (HOSPITAL_COMMUNITY): Payer: Self-pay

## 2021-03-02 LAB — SARS CORONAVIRUS 2 (TAT 6-24 HRS): SARS Coronavirus 2: NEGATIVE

## 2021-03-02 MED ORDER — DEXMEDETOMIDINE HCL IN NACL 400 MCG/100ML IV SOLN
0.1000 ug/kg/h | INTRAVENOUS | Status: AC
  Administered 2021-03-03: .7 ug/kg/h via INTRAVENOUS
  Administered 2021-03-03: .3 ug/kg/h via INTRAVENOUS
  Filled 2021-03-02: qty 100

## 2021-03-02 MED ORDER — HEPARIN 30,000 UNITS/1000 ML (OHS) CELLSAVER SOLUTION
Status: DC
  Filled 2021-03-02: qty 1000

## 2021-03-02 MED ORDER — EPINEPHRINE HCL 5 MG/250ML IV SOLN IN NS
0.0000 ug/min | INTRAVENOUS | Status: DC
  Filled 2021-03-02: qty 250

## 2021-03-02 MED ORDER — NOREPINEPHRINE 4 MG/250ML-% IV SOLN
0.0000 ug/min | INTRAVENOUS | Status: DC
  Filled 2021-03-02: qty 250

## 2021-03-02 MED ORDER — CEFAZOLIN SODIUM-DEXTROSE 2-4 GM/100ML-% IV SOLN
2.0000 g | INTRAVENOUS | Status: AC
  Administered 2021-03-03: 2 g via INTRAVENOUS
  Filled 2021-03-02: qty 100

## 2021-03-02 MED ORDER — TRANEXAMIC ACID 1000 MG/10ML IV SOLN
1.5000 mg/kg/h | INTRAVENOUS | Status: AC
  Administered 2021-03-03: 1.5 mg/kg/h via INTRAVENOUS
  Filled 2021-03-02: qty 25

## 2021-03-02 MED ORDER — MANNITOL 20 % IV SOLN
INTRAVENOUS | Status: DC
  Filled 2021-03-02: qty 13

## 2021-03-02 MED ORDER — NITROGLYCERIN IN D5W 200-5 MCG/ML-% IV SOLN
2.0000 ug/min | INTRAVENOUS | Status: DC
  Filled 2021-03-02: qty 250

## 2021-03-02 MED ORDER — MILRINONE LACTATE IN DEXTROSE 20-5 MG/100ML-% IV SOLN
0.3000 ug/kg/min | INTRAVENOUS | Status: DC
  Filled 2021-03-02: qty 100

## 2021-03-02 MED ORDER — INSULIN REGULAR(HUMAN) IN NACL 100-0.9 UT/100ML-% IV SOLN
INTRAVENOUS | Status: AC
  Administered 2021-03-03: .8 [IU]/h via INTRAVENOUS
  Filled 2021-03-02: qty 100

## 2021-03-02 MED ORDER — PLASMA-LYTE A IV SOLN
INTRAVENOUS | Status: DC
  Filled 2021-03-02: qty 5

## 2021-03-02 MED ORDER — VANCOMYCIN HCL 1500 MG/300ML IV SOLN
1500.0000 mg | INTRAVENOUS | Status: AC
  Administered 2021-03-03: 1000 mg via INTRAVENOUS
  Filled 2021-03-02: qty 300

## 2021-03-02 MED ORDER — POTASSIUM CHLORIDE 2 MEQ/ML IV SOLN
80.0000 meq | INTRAVENOUS | Status: DC
  Filled 2021-03-02: qty 40

## 2021-03-02 MED ORDER — PHENYLEPHRINE HCL-NACL 20-0.9 MG/250ML-% IV SOLN
30.0000 ug/min | INTRAVENOUS | Status: AC
  Administered 2021-03-03: 20 ug/min via INTRAVENOUS
  Filled 2021-03-02: qty 250

## 2021-03-02 MED ORDER — TRANEXAMIC ACID (OHS) PUMP PRIME SOLUTION
2.0000 mg/kg | INTRAVENOUS | Status: DC
  Filled 2021-03-02: qty 1.93

## 2021-03-02 MED ORDER — TRANEXAMIC ACID (OHS) BOLUS VIA INFUSION
15.0000 mg/kg | INTRAVENOUS | Status: AC
  Administered 2021-03-03: 1446 mg via INTRAVENOUS
  Filled 2021-03-02: qty 1446

## 2021-03-03 ENCOUNTER — Inpatient Hospital Stay (HOSPITAL_COMMUNITY): Payer: Medicare PPO | Admitting: Anesthesiology

## 2021-03-03 ENCOUNTER — Other Ambulatory Visit: Payer: Self-pay

## 2021-03-03 ENCOUNTER — Inpatient Hospital Stay (HOSPITAL_COMMUNITY)
Admission: RE | Admit: 2021-03-03 | Discharge: 2021-03-12 | DRG: 219 | Disposition: A | Discharge status: Home / Self Care | Payer: Medicare PPO | Attending: Thoracic Surgery (Cardiothoracic Vascular Surgery) | Admitting: Thoracic Surgery (Cardiothoracic Vascular Surgery)

## 2021-03-03 ENCOUNTER — Inpatient Hospital Stay (HOSPITAL_COMMUNITY): Payer: Medicare PPO

## 2021-03-03 ENCOUNTER — Encounter (HOSPITAL_COMMUNITY)
Admission: RE | Disposition: A | Discharge status: Home / Self Care | Payer: Self-pay | Source: Home / Self Care | Attending: Thoracic Surgery (Cardiothoracic Vascular Surgery)

## 2021-03-03 ENCOUNTER — Encounter (HOSPITAL_COMMUNITY): Payer: Self-pay | Admitting: Thoracic Surgery (Cardiothoracic Vascular Surgery)

## 2021-03-03 ENCOUNTER — Inpatient Hospital Stay (HOSPITAL_COMMUNITY): Payer: Medicare PPO | Admitting: Physician Assistant

## 2021-03-03 DIAGNOSIS — J95821 Acute postprocedural respiratory failure: Secondary | ICD-10-CM | POA: Diagnosis not present

## 2021-03-03 DIAGNOSIS — E7800 Pure hypercholesterolemia, unspecified: Secondary | ICD-10-CM | POA: Diagnosis present

## 2021-03-03 DIAGNOSIS — Z09 Encounter for follow-up examination after completed treatment for conditions other than malignant neoplasm: Secondary | ICD-10-CM

## 2021-03-03 DIAGNOSIS — R578 Other shock: Secondary | ICD-10-CM | POA: Diagnosis not present

## 2021-03-03 DIAGNOSIS — D689 Coagulation defect, unspecified: Secondary | ICD-10-CM | POA: Diagnosis not present

## 2021-03-03 DIAGNOSIS — T380X5A Adverse effect of glucocorticoids and synthetic analogues, initial encounter: Secondary | ICD-10-CM | POA: Diagnosis not present

## 2021-03-03 DIAGNOSIS — I480 Paroxysmal atrial fibrillation: Secondary | ICD-10-CM | POA: Diagnosis not present

## 2021-03-03 DIAGNOSIS — G629 Polyneuropathy, unspecified: Secondary | ICD-10-CM | POA: Diagnosis not present

## 2021-03-03 DIAGNOSIS — I493 Ventricular premature depolarization: Secondary | ICD-10-CM | POA: Diagnosis not present

## 2021-03-03 DIAGNOSIS — K219 Gastro-esophageal reflux disease without esophagitis: Secondary | ICD-10-CM | POA: Diagnosis present

## 2021-03-03 DIAGNOSIS — Z6833 Body mass index (BMI) 33.0-33.9, adult: Secondary | ICD-10-CM | POA: Diagnosis not present

## 2021-03-03 DIAGNOSIS — I2510 Atherosclerotic heart disease of native coronary artery without angina pectoris: Principal | ICD-10-CM | POA: Diagnosis present

## 2021-03-03 DIAGNOSIS — G7000 Myasthenia gravis without (acute) exacerbation: Secondary | ICD-10-CM | POA: Diagnosis not present

## 2021-03-03 DIAGNOSIS — I2582 Chronic total occlusion of coronary artery: Secondary | ICD-10-CM | POA: Diagnosis present

## 2021-03-03 DIAGNOSIS — D62 Acute posthemorrhagic anemia: Secondary | ICD-10-CM | POA: Diagnosis not present

## 2021-03-03 DIAGNOSIS — J9601 Acute respiratory failure with hypoxia: Secondary | ICD-10-CM

## 2021-03-03 DIAGNOSIS — Z7982 Long term (current) use of aspirin: Secondary | ICD-10-CM

## 2021-03-03 DIAGNOSIS — D6959 Other secondary thrombocytopenia: Secondary | ICD-10-CM | POA: Diagnosis not present

## 2021-03-03 DIAGNOSIS — R739 Hyperglycemia, unspecified: Secondary | ICD-10-CM | POA: Diagnosis not present

## 2021-03-03 DIAGNOSIS — H9190 Unspecified hearing loss, unspecified ear: Secondary | ICD-10-CM | POA: Diagnosis present

## 2021-03-03 DIAGNOSIS — Z86718 Personal history of other venous thrombosis and embolism: Secondary | ICD-10-CM | POA: Diagnosis not present

## 2021-03-03 DIAGNOSIS — N189 Chronic kidney disease, unspecified: Secondary | ICD-10-CM | POA: Diagnosis present

## 2021-03-03 DIAGNOSIS — R7303 Prediabetes: Secondary | ICD-10-CM | POA: Diagnosis present

## 2021-03-03 DIAGNOSIS — I129 Hypertensive chronic kidney disease with stage 1 through stage 4 chronic kidney disease, or unspecified chronic kidney disease: Secondary | ICD-10-CM | POA: Diagnosis present

## 2021-03-03 DIAGNOSIS — I350 Nonrheumatic aortic (valve) stenosis: Secondary | ICD-10-CM

## 2021-03-03 DIAGNOSIS — Z79899 Other long term (current) drug therapy: Secondary | ICD-10-CM

## 2021-03-03 DIAGNOSIS — M21371 Foot drop, right foot: Secondary | ICD-10-CM | POA: Diagnosis present

## 2021-03-03 DIAGNOSIS — Y838 Other surgical procedures as the cause of abnormal reaction of the patient, or of later complication, without mention of misadventure at the time of the procedure: Secondary | ICD-10-CM | POA: Diagnosis not present

## 2021-03-03 DIAGNOSIS — Z20822 Contact with and (suspected) exposure to covid-19: Secondary | ICD-10-CM | POA: Diagnosis not present

## 2021-03-03 DIAGNOSIS — J9811 Atelectasis: Secondary | ICD-10-CM | POA: Diagnosis present

## 2021-03-03 DIAGNOSIS — Z952 Presence of prosthetic heart valve: Secondary | ICD-10-CM

## 2021-03-03 DIAGNOSIS — Z951 Presence of aortocoronary bypass graft: Secondary | ICD-10-CM | POA: Diagnosis not present

## 2021-03-03 DIAGNOSIS — G4733 Obstructive sleep apnea (adult) (pediatric): Secondary | ICD-10-CM | POA: Diagnosis present

## 2021-03-03 DIAGNOSIS — D696 Thrombocytopenia, unspecified: Secondary | ICD-10-CM | POA: Diagnosis not present

## 2021-03-03 DIAGNOSIS — R570 Cardiogenic shock: Secondary | ICD-10-CM | POA: Diagnosis not present

## 2021-03-03 DIAGNOSIS — T8111XA Postprocedural  cardiogenic shock, initial encounter: Secondary | ICD-10-CM | POA: Diagnosis not present

## 2021-03-03 DIAGNOSIS — E8770 Fluid overload, unspecified: Secondary | ICD-10-CM | POA: Diagnosis not present

## 2021-03-03 DIAGNOSIS — E669 Obesity, unspecified: Secondary | ICD-10-CM | POA: Diagnosis present

## 2021-03-03 DIAGNOSIS — Y92239 Unspecified place in hospital as the place of occurrence of the external cause: Secondary | ICD-10-CM | POA: Diagnosis not present

## 2021-03-03 DIAGNOSIS — J939 Pneumothorax, unspecified: Secondary | ICD-10-CM

## 2021-03-03 DIAGNOSIS — I97190 Other postprocedural cardiac functional disturbances following cardiac surgery: Secondary | ICD-10-CM | POA: Diagnosis not present

## 2021-03-03 DIAGNOSIS — J90 Pleural effusion, not elsewhere classified: Secondary | ICD-10-CM

## 2021-03-03 DIAGNOSIS — J9602 Acute respiratory failure with hypercapnia: Secondary | ICD-10-CM | POA: Diagnosis not present

## 2021-03-03 DIAGNOSIS — Z7901 Long term (current) use of anticoagulants: Secondary | ICD-10-CM

## 2021-03-03 DIAGNOSIS — Z8249 Family history of ischemic heart disease and other diseases of the circulatory system: Secondary | ICD-10-CM

## 2021-03-03 DIAGNOSIS — Q231 Congenital insufficiency of aortic valve: Secondary | ICD-10-CM

## 2021-03-03 DIAGNOSIS — Z7952 Long term (current) use of systemic steroids: Secondary | ICD-10-CM

## 2021-03-03 HISTORY — PX: CORONARY ARTERY BYPASS GRAFT: SHX141

## 2021-03-03 HISTORY — PX: TEE WITHOUT CARDIOVERSION: SHX5443

## 2021-03-03 HISTORY — PX: AORTIC VALVE REPLACEMENT: SHX41

## 2021-03-03 HISTORY — PX: ENDOVEIN HARVEST OF GREATER SAPHENOUS VEIN: SHX5059

## 2021-03-03 HISTORY — PX: MEDIASTINAL EXPLORATION: SHX5065

## 2021-03-03 LAB — POCT I-STAT, CHEM 8
BUN: 17 mg/dL (ref 8–23)
BUN: 16 mg/dL (ref 8–23)
BUN: 20 mg/dL (ref 8–23)
BUN: 16 mg/dL (ref 8–23)
BUN: 18 mg/dL (ref 8–23)
BUN: 14 mg/dL (ref 8–23)
BUN: 18 mg/dL (ref 8–23)
Calcium, Ion: 1.15 mmol/L (ref 1.15–1.40)
Calcium, Ion: 1.02 mmol/L — ABNORMAL LOW (ref 1.15–1.40)
Calcium, Ion: 1.22 mmol/L (ref 1.15–1.40)
Calcium, Ion: 1.07 mmol/L — ABNORMAL LOW (ref 1.15–1.40)
Calcium, Ion: 1.09 mmol/L — ABNORMAL LOW (ref 1.15–1.40)
Calcium, Ion: 1.02 mmol/L — ABNORMAL LOW (ref 1.15–1.40)
Calcium, Ion: 1.31 mmol/L (ref 1.15–1.40)
Chloride: 107 mmol/L (ref 98–111)
Chloride: 107 mmol/L (ref 98–111)
Chloride: 108 mmol/L (ref 98–111)
Chloride: 108 mmol/L (ref 98–111)
Chloride: 107 mmol/L (ref 98–111)
Chloride: 107 mmol/L (ref 98–111)
Chloride: 107 mmol/L (ref 98–111)
Creatinine, Ser: 1.3 mg/dL — ABNORMAL HIGH (ref 0.61–1.24)
Creatinine, Ser: 1 mg/dL (ref 0.61–1.24)
Creatinine, Ser: 1.2 mg/dL (ref 0.61–1.24)
Creatinine, Ser: 1.1 mg/dL (ref 0.61–1.24)
Creatinine, Ser: 1.1 mg/dL (ref 0.61–1.24)
Creatinine, Ser: 1.1 mg/dL (ref 0.61–1.24)
Creatinine, Ser: 1.1 mg/dL (ref 0.61–1.24)
Glucose, Bld: 96 mg/dL (ref 70–99)
Glucose, Bld: 102 mg/dL — ABNORMAL HIGH (ref 70–99)
Glucose, Bld: 102 mg/dL — ABNORMAL HIGH (ref 70–99)
Glucose, Bld: 157 mg/dL — ABNORMAL HIGH (ref 70–99)
Glucose, Bld: 155 mg/dL — ABNORMAL HIGH (ref 70–99)
Glucose, Bld: 144 mg/dL — ABNORMAL HIGH (ref 70–99)
Glucose, Bld: 147 mg/dL — ABNORMAL HIGH (ref 70–99)
HCT: 32 % — ABNORMAL LOW (ref 39.0–52.0)
HCT: 24 % — ABNORMAL LOW (ref 39.0–52.0)
HCT: 42 % (ref 39.0–52.0)
HCT: 28 % — ABNORMAL LOW (ref 39.0–52.0)
HCT: 27 % — ABNORMAL LOW (ref 39.0–52.0)
HCT: 23 % — ABNORMAL LOW (ref 39.0–52.0)
HCT: 24 % — ABNORMAL LOW (ref 39.0–52.0)
Hemoglobin: 10.9 g/dL — ABNORMAL LOW (ref 13.0–17.0)
Hemoglobin: 14.3 g/dL (ref 13.0–17.0)
Hemoglobin: 8.2 g/dL — ABNORMAL LOW (ref 13.0–17.0)
Hemoglobin: 9.5 g/dL — ABNORMAL LOW (ref 13.0–17.0)
Hemoglobin: 9.2 g/dL — ABNORMAL LOW (ref 13.0–17.0)
Hemoglobin: 7.8 g/dL — ABNORMAL LOW (ref 13.0–17.0)
Hemoglobin: 8.2 g/dL — ABNORMAL LOW (ref 13.0–17.0)
Potassium: 4.1 mmol/L (ref 3.5–5.1)
Potassium: 4.3 mmol/L (ref 3.5–5.1)
Potassium: 7 mmol/L (ref 3.5–5.1)
Potassium: 5.9 mmol/L — ABNORMAL HIGH (ref 3.5–5.1)
Potassium: 5.8 mmol/L — ABNORMAL HIGH (ref 3.5–5.1)
Potassium: 4.9 mmol/L (ref 3.5–5.1)
Potassium: 6.1 mmol/L — ABNORMAL HIGH (ref 3.5–5.1)
Sodium: 141 mmol/L (ref 135–145)
Sodium: 139 mmol/L (ref 135–145)
Sodium: 141 mmol/L (ref 135–145)
Sodium: 140 mmol/L (ref 135–145)
Sodium: 140 mmol/L (ref 135–145)
Sodium: 138 mmol/L (ref 135–145)
Sodium: 141 mmol/L (ref 135–145)
TCO2: 24 mmol/L (ref 22–32)
TCO2: 25 mmol/L (ref 22–32)
TCO2: 26 mmol/L (ref 22–32)
TCO2: 29 mmol/L (ref 22–32)
TCO2: 29 mmol/L (ref 22–32)
TCO2: 26 mmol/L (ref 22–32)
TCO2: 27 mmol/L (ref 22–32)

## 2021-03-03 LAB — POCT I-STAT 7, (LYTES, BLD GAS, ICA,H+H)
Acid-Base Excess: 0 mmol/L (ref 0.0–2.0)
Acid-Base Excess: 1 mmol/L (ref 0.0–2.0)
Acid-base deficit: 1 mmol/L (ref 0.0–2.0)
Acid-base deficit: 1 mmol/L (ref 0.0–2.0)
Acid-base deficit: 2 mmol/L (ref 0.0–2.0)
Acid-base deficit: 1 mmol/L (ref 0.0–2.0)
Acid-base deficit: 3 mmol/L — ABNORMAL HIGH (ref 0.0–2.0)
Acid-base deficit: 2 mmol/L (ref 0.0–2.0)
Bicarbonate: 24.9 mmol/L (ref 20.0–28.0)
Bicarbonate: 24.9 mmol/L (ref 20.0–28.0)
Bicarbonate: 25.4 mmol/L (ref 20.0–28.0)
Bicarbonate: 25.9 mmol/L (ref 20.0–28.0)
Bicarbonate: 23.5 mmol/L (ref 20.0–28.0)
Bicarbonate: 23.1 mmol/L (ref 20.0–28.0)
Bicarbonate: 24.8 mmol/L (ref 20.0–28.0)
Bicarbonate: 22.9 mmol/L (ref 20.0–28.0)
Calcium, Ion: 1.24 mmol/L (ref 1.15–1.40)
Calcium, Ion: 1.05 mmol/L — ABNORMAL LOW (ref 1.15–1.40)
Calcium, Ion: 1.05 mmol/L — ABNORMAL LOW (ref 1.15–1.40)
Calcium, Ion: 1.3 mmol/L (ref 1.15–1.40)
Calcium, Ion: 1.13 mmol/L — ABNORMAL LOW (ref 1.15–1.40)
Calcium, Ion: 1.13 mmol/L — ABNORMAL LOW (ref 1.15–1.40)
Calcium, Ion: 1.19 mmol/L (ref 1.15–1.40)
Calcium, Ion: 1.09 mmol/L — ABNORMAL LOW (ref 1.15–1.40)
HCT: 36 % — ABNORMAL LOW (ref 39.0–52.0)
HCT: 26 % — ABNORMAL LOW (ref 39.0–52.0)
HCT: 32 % — ABNORMAL LOW (ref 39.0–52.0)
HCT: 32 % — ABNORMAL LOW (ref 39.0–52.0)
HCT: 24 % — ABNORMAL LOW (ref 39.0–52.0)
HCT: 25 % — ABNORMAL LOW (ref 39.0–52.0)
HCT: 26 % — ABNORMAL LOW (ref 39.0–52.0)
HCT: 24 % — ABNORMAL LOW (ref 39.0–52.0)
Hemoglobin: 12.2 g/dL — ABNORMAL LOW (ref 13.0–17.0)
Hemoglobin: 10.9 g/dL — ABNORMAL LOW (ref 13.0–17.0)
Hemoglobin: 8.8 g/dL — ABNORMAL LOW (ref 13.0–17.0)
Hemoglobin: 10.9 g/dL — ABNORMAL LOW (ref 13.0–17.0)
Hemoglobin: 8.2 g/dL — ABNORMAL LOW (ref 13.0–17.0)
Hemoglobin: 8.5 g/dL — ABNORMAL LOW (ref 13.0–17.0)
Hemoglobin: 8.8 g/dL — ABNORMAL LOW (ref 13.0–17.0)
Hemoglobin: 8.2 g/dL — ABNORMAL LOW (ref 13.0–17.0)
O2 Saturation: 100 %
O2 Saturation: 100 %
O2 Saturation: 75 %
O2 Saturation: 96 %
O2 Saturation: 100 %
O2 Saturation: 100 %
O2 Saturation: 96 %
O2 Saturation: 98 %
Patient temperature: 36.1
Patient temperature: 36.7
Potassium: 4.1 mmol/L (ref 3.5–5.1)
Potassium: 4.2 mmol/L (ref 3.5–5.1)
Potassium: 4.5 mmol/L (ref 3.5–5.1)
Potassium: 4.9 mmol/L (ref 3.5–5.1)
Potassium: 4.2 mmol/L (ref 3.5–5.1)
Potassium: 4.4 mmol/L (ref 3.5–5.1)
Potassium: 4.5 mmol/L (ref 3.5–5.1)
Potassium: 4.6 mmol/L (ref 3.5–5.1)
Sodium: 141 mmol/L (ref 135–145)
Sodium: 142 mmol/L (ref 135–145)
Sodium: 143 mmol/L (ref 135–145)
Sodium: 141 mmol/L (ref 135–145)
Sodium: 145 mmol/L (ref 135–145)
Sodium: 143 mmol/L (ref 135–145)
Sodium: 145 mmol/L (ref 135–145)
Sodium: 140 mmol/L (ref 135–145)
TCO2: 26 mmol/L (ref 22–32)
TCO2: 26 mmol/L (ref 22–32)
TCO2: 27 mmol/L (ref 22–32)
TCO2: 27 mmol/L (ref 22–32)
TCO2: 25 mmol/L (ref 22–32)
TCO2: 25 mmol/L (ref 22–32)
TCO2: 26 mmol/L (ref 22–32)
TCO2: 24 mmol/L (ref 22–32)
pCO2 arterial: 44.3 mmHg (ref 32.0–48.0)
pCO2 arterial: 38.2 mmHg (ref 32.0–48.0)
pCO2 arterial: 48.1 mmHg — ABNORMAL HIGH (ref 32.0–48.0)
pCO2 arterial: 43.3 mmHg (ref 32.0–48.0)
pCO2 arterial: 41.1 mmHg (ref 32.0–48.0)
pCO2 arterial: 45.9 mmHg (ref 32.0–48.0)
pCO2 arterial: 47.8 mmHg (ref 32.0–48.0)
pCO2 arterial: 38.3 mmHg (ref 32.0–48.0)
pH, Arterial: 7.358 (ref 7.350–7.450)
pH, Arterial: 7.33 — ABNORMAL LOW (ref 7.350–7.450)
pH, Arterial: 7.422 (ref 7.350–7.450)
pH, Arterial: 7.385 (ref 7.350–7.450)
pH, Arterial: 7.366 (ref 7.350–7.450)
pH, Arterial: 7.305 — ABNORMAL LOW (ref 7.350–7.450)
pH, Arterial: 7.322 — ABNORMAL LOW (ref 7.350–7.450)
pH, Arterial: 7.384 (ref 7.350–7.450)
pO2, Arterial: 189 mmHg — ABNORMAL HIGH (ref 83.0–108.0)
pO2, Arterial: 330 mmHg — ABNORMAL HIGH (ref 83.0–108.0)
pO2, Arterial: 43 mmHg — ABNORMAL LOW (ref 83.0–108.0)
pO2, Arterial: 85 mmHg (ref 83.0–108.0)
pO2, Arterial: 293 mmHg — ABNORMAL HIGH (ref 83.0–108.0)
pO2, Arterial: 309 mmHg — ABNORMAL HIGH (ref 83.0–108.0)
pO2, Arterial: 88 mmHg (ref 83.0–108.0)
pO2, Arterial: 113 mmHg — ABNORMAL HIGH (ref 83.0–108.0)

## 2021-03-03 LAB — HEMOGLOBIN AND HEMATOCRIT, BLOOD
HCT: 25.5 % — ABNORMAL LOW (ref 39.0–52.0)
Hemoglobin: 8.6 g/dL — ABNORMAL LOW (ref 13.0–17.0)

## 2021-03-03 LAB — GLUCOSE, CAPILLARY
Glucose-Capillary: 121 mg/dL — ABNORMAL HIGH (ref 70–99)
Glucose-Capillary: 102 mg/dL — ABNORMAL HIGH (ref 70–99)
Glucose-Capillary: 128 mg/dL — ABNORMAL HIGH (ref 70–99)
Glucose-Capillary: 138 mg/dL — ABNORMAL HIGH (ref 70–99)
Glucose-Capillary: 144 mg/dL — ABNORMAL HIGH (ref 70–99)
Glucose-Capillary: 154 mg/dL — ABNORMAL HIGH (ref 70–99)
Glucose-Capillary: 169 mg/dL — ABNORMAL HIGH (ref 70–99)

## 2021-03-03 LAB — CBC
HCT: 29.1 % — ABNORMAL LOW (ref 39.0–52.0)
HCT: 26.4 % — ABNORMAL LOW (ref 39.0–52.0)
Hemoglobin: 9.6 g/dL — ABNORMAL LOW (ref 13.0–17.0)
Hemoglobin: 8.9 g/dL — ABNORMAL LOW (ref 13.0–17.0)
MCH: 29.5 pg (ref 26.0–34.0)
MCH: 30.2 pg (ref 26.0–34.0)
MCHC: 33 g/dL (ref 30.0–36.0)
MCHC: 33.7 g/dL (ref 30.0–36.0)
MCV: 89.5 fL (ref 80.0–100.0)
MCV: 89.5 fL (ref 80.0–100.0)
Platelets: 89 10*3/uL — ABNORMAL LOW (ref 150–400)
Platelets: 82 10*3/uL — ABNORMAL LOW (ref 150–400)
RBC: 3.25 MIL/uL — ABNORMAL LOW (ref 4.22–5.81)
RBC: 2.95 MIL/uL — ABNORMAL LOW (ref 4.22–5.81)
RDW: 14 % (ref 11.5–15.5)
RDW: 14.3 % (ref 11.5–15.5)
WBC: 10.8 10*3/uL — ABNORMAL HIGH (ref 4.0–10.5)
WBC: 10.7 10*3/uL — ABNORMAL HIGH (ref 4.0–10.5)
nRBC: 0 % (ref 0.0–0.2)
nRBC: 0 % (ref 0.0–0.2)

## 2021-03-03 LAB — BASIC METABOLIC PANEL
Anion gap: 6 (ref 5–15)
BUN: 13 mg/dL (ref 8–23)
CO2: 23 mmol/L (ref 22–32)
Calcium: 7.5 mg/dL — ABNORMAL LOW (ref 8.9–10.3)
Chloride: 109 mmol/L (ref 98–111)
Creatinine, Ser: 1.02 mg/dL (ref 0.61–1.24)
GFR, Estimated: >60 mL/min (ref >60)
Glucose, Bld: 176 mg/dL — ABNORMAL HIGH (ref 70–99)
Potassium: 4.7 mmol/L (ref 3.5–5.1)
Sodium: 138 mmol/L (ref 135–145)

## 2021-03-03 LAB — PROTIME-INR
INR: 1.8 — ABNORMAL HIGH (ref 0.8–1.2)
INR: 1.6 — ABNORMAL HIGH (ref 0.8–1.2)
Prothrombin Time: 20.4 seconds — ABNORMAL HIGH (ref 11.4–15.2)
Prothrombin Time: 19 seconds — ABNORMAL HIGH (ref 11.4–15.2)

## 2021-03-03 LAB — PREPARE RBC (CROSSMATCH)

## 2021-03-03 LAB — APTT: aPTT: 44 seconds — ABNORMAL HIGH (ref 24–36)

## 2021-03-03 LAB — PLATELET COUNT: Platelets: 111 10*3/uL — ABNORMAL LOW (ref 150–400)

## 2021-03-03 LAB — FIBRINOGEN
Fibrinogen: 187 mg/dL — ABNORMAL LOW (ref 210–475)
Fibrinogen: 204 mg/dL — ABNORMAL LOW (ref 210–475)

## 2021-03-03 LAB — ABO/RH: ABO/RH(D): A POS

## 2021-03-03 SURGERY — REPLACEMENT, AORTIC VALVE, OPEN
Anesthesia: General | Site: Chest

## 2021-03-03 MED ORDER — ACETAMINOPHEN 650 MG RE SUPP
650.0000 mg | Freq: Once | RECTAL | Status: AC
  Administered 2021-03-03: 650 mg via RECTAL

## 2021-03-03 MED ORDER — HEPARIN SODIUM (PORCINE) 1000 UNIT/ML IJ SOLN
INTRAMUSCULAR | Status: DC | PRN
  Administered 2021-03-03: 33000 [IU] via INTRAVENOUS

## 2021-03-03 MED ORDER — HEPARIN SODIUM (PORCINE) 1000 UNIT/ML IJ SOLN
INTRAMUSCULAR | Status: AC
  Filled 2021-03-03: qty 1

## 2021-03-03 MED ORDER — SODIUM CHLORIDE 0.9 % IV SOLN: 250.0000 mL | INTRAVENOUS | Status: DC

## 2021-03-03 MED ORDER — OXYCODONE HCL 5 MG/5ML PO SOLN: 5.0000 mg | ORAL | Status: DC | PRN

## 2021-03-03 MED ORDER — MORPHINE SULFATE (PF) 2 MG/ML IV SOLN: 1.0000 mg | INTRAVENOUS | Status: DC | PRN

## 2021-03-03 MED ORDER — CHLORHEXIDINE GLUCONATE CLOTH 2 % EX PADS
6.0000 | MEDICATED_PAD | Freq: Every day | CUTANEOUS | Status: DC
  Administered 2021-03-04 – 2021-03-11 (×5): 6 via TOPICAL

## 2021-03-03 MED ORDER — ALBUMIN HUMAN 5 % IV SOLN
250.0000 mL | INTRAVENOUS | Status: AC | PRN
  Administered 2021-03-03 (×4): 12.5 g via INTRAVENOUS
  Filled 2021-03-03 (×2): qty 250

## 2021-03-03 MED ORDER — PHENYLEPHRINE 40 MCG/ML (10ML) SYRINGE FOR IV PUSH (FOR BLOOD PRESSURE SUPPORT)
PREFILLED_SYRINGE | INTRAVENOUS | Status: DC | PRN
  Administered 2021-03-03: 120 ug via INTRAVENOUS
  Administered 2021-03-03: 40 ug via INTRAVENOUS
  Administered 2021-03-03: 80 ug via INTRAVENOUS
  Administered 2021-03-03: 40 ug via INTRAVENOUS
  Administered 2021-03-03 (×4): 80 ug via INTRAVENOUS
  Administered 2021-03-03 (×3): 40 ug via INTRAVENOUS
  Administered 2021-03-03: 80 ug via INTRAVENOUS
  Administered 2021-03-03: 40 ug via INTRAVENOUS
  Administered 2021-03-03 (×2): 80 ug via INTRAVENOUS

## 2021-03-03 MED ORDER — METOPROLOL TARTRATE 5 MG/5ML IV SOLN
2.5000 mg | INTRAVENOUS | Status: DC | PRN
  Administered 2021-03-05: 2.5 mg via INTRAVENOUS
  Administered 2021-03-09 – 2021-03-10 (×2): 3 mg via INTRAVENOUS
  Administered 2021-03-11 (×2): 5 mg via INTRAVENOUS
  Filled 2021-03-03 (×5): qty 5

## 2021-03-03 MED ORDER — PYRIDOSTIGMINE BROMIDE 60 MG PO TABS
30.0000 mg | ORAL_TABLET | Freq: Three times a day (TID) | ORAL | Status: DC
  Filled 2021-03-03 (×2): qty 0.5

## 2021-03-03 MED ORDER — PREDNISONE 5 MG PO TABS: 6.0000 mg | ORAL_TABLET | Freq: Every day | ORAL | Status: DC

## 2021-03-03 MED ORDER — METOPROLOL TARTRATE 12.5 MG HALF TABLET: 12.5000 mg | ORAL_TABLET | Freq: Once | ORAL | Status: DC

## 2021-03-03 MED ORDER — PHENYLEPHRINE 40 MCG/ML (10ML) SYRINGE FOR IV PUSH (FOR BLOOD PRESSURE SUPPORT)
PREFILLED_SYRINGE | INTRAVENOUS | Status: AC
  Filled 2021-03-03: qty 20

## 2021-03-03 MED ORDER — OXYCODONE HCL 5 MG PO TABS: 5.0000 mg | ORAL_TABLET | ORAL | Status: DC | PRN

## 2021-03-03 MED ORDER — DEXMEDETOMIDINE HCL IN NACL 400 MCG/100ML IV SOLN
0.0000 ug/kg/h | INTRAVENOUS | Status: DC
Start: 2021-03-03 — End: 2021-03-05
  Administered 2021-03-03 (×2): 0.7 ug/kg/h via INTRAVENOUS
  Filled 2021-03-03 (×2): qty 100

## 2021-03-03 MED ORDER — SODIUM CHLORIDE 0.45 % IV SOLN: INTRAVENOUS | Status: DC | PRN

## 2021-03-03 MED ORDER — HEMOSTATIC AGENTS (NO CHARGE) OPTIME
TOPICAL | Status: DC | PRN
  Administered 2021-03-03: 1 via TOPICAL

## 2021-03-03 MED ORDER — NOREPINEPHRINE 4 MG/250ML-% IV SOLN
0.0000 ug/min | INTRAVENOUS | Status: DC
  Administered 2021-03-03: 5 ug/min via INTRAVENOUS
  Administered 2021-03-04: 14 ug/min via INTRAVENOUS
  Administered 2021-03-04: 18 ug/min via INTRAVENOUS
  Administered 2021-03-04: 12 ug/min via INTRAVENOUS
  Filled 2021-03-03 (×3): qty 250

## 2021-03-03 MED ORDER — ACETAMINOPHEN 160 MG/5ML PO SOLN: 650.0000 mg | Freq: Once | ORAL | Status: AC

## 2021-03-03 MED ORDER — LACTATED RINGERS IV SOLN: INTRAVENOUS | Status: DC | PRN

## 2021-03-03 MED ORDER — ORAL CARE MOUTH RINSE
15.0000 mL | OROMUCOSAL | Status: DC
  Administered 2021-03-04 (×9): 15 mL via OROMUCOSAL

## 2021-03-03 MED ORDER — LACTATED RINGERS IV SOLN: INTRAVENOUS | Status: DC

## 2021-03-03 MED ORDER — CHLORHEXIDINE GLUCONATE 0.12 % MT SOLN
15.0000 mL | OROMUCOSAL | Status: AC
  Administered 2021-03-03: 15 mL via OROMUCOSAL

## 2021-03-03 MED ORDER — 0.9 % SODIUM CHLORIDE (POUR BTL) OPTIME
TOPICAL | Status: DC | PRN
  Administered 2021-03-03: 5000 mL

## 2021-03-03 MED ORDER — MIDAZOLAM HCL 2 MG/2ML IJ SOLN
2.0000 mg | INTRAMUSCULAR | Status: DC | PRN
  Administered 2021-03-03 – 2021-03-04 (×4): 2 mg via INTRAVENOUS
  Filled 2021-03-03 (×4): qty 2

## 2021-03-03 MED ORDER — PYRIDOSTIGMINE BROMIDE 60 MG PO TABS
30.0000 mg | ORAL_TABLET | Freq: Three times a day (TID) | ORAL | Status: DC
  Administered 2021-03-03 – 2021-03-04 (×3): 30 mg
  Filled 2021-03-03 (×5): qty 0.5

## 2021-03-03 MED ORDER — EPHEDRINE SULFATE-NACL 50-0.9 MG/10ML-% IV SOSY
PREFILLED_SYRINGE | INTRAVENOUS | Status: DC | PRN
  Administered 2021-03-03 (×4): 5 mg via INTRAVENOUS

## 2021-03-03 MED ORDER — VANCOMYCIN HCL IN DEXTROSE 1-5 GM/200ML-% IV SOLN
1000.0000 mg | Freq: Once | INTRAVENOUS | Status: AC
  Administered 2021-03-03: 1000 mg via INTRAVENOUS
  Filled 2021-03-03: qty 200

## 2021-03-03 MED ORDER — ASPIRIN EC 325 MG PO TBEC
325.0000 mg | DELAYED_RELEASE_TABLET | Freq: Every day | ORAL | Status: DC
  Administered 2021-03-05 – 2021-03-10 (×6): 325 mg via ORAL
  Filled 2021-03-03 (×6): qty 1

## 2021-03-03 MED ORDER — ACETAMINOPHEN 160 MG/5ML PO SOLN
1000.0000 mg | Freq: Four times a day (QID) | ORAL | Status: AC
  Administered 2021-03-04 (×2): 1000 mg
  Filled 2021-03-03 (×2): qty 40.6

## 2021-03-03 MED ORDER — CHLORHEXIDINE GLUCONATE 4 % EX LIQD: 30.0000 mL | CUTANEOUS | Status: DC

## 2021-03-03 MED ORDER — METOPROLOL TARTRATE 12.5 MG HALF TABLET
12.5000 mg | ORAL_TABLET | Freq: Two times a day (BID) | ORAL | Status: DC
  Administered 2021-03-05 – 2021-03-07 (×5): 12.5 mg via ORAL
  Filled 2021-03-03 (×5): qty 1

## 2021-03-03 MED ORDER — CHLORHEXIDINE GLUCONATE 0.12 % MT SOLN
15.0000 mL | Freq: Once | OROMUCOSAL | Status: AC
  Administered 2021-03-03: 15 mL via OROMUCOSAL
  Filled 2021-03-03: qty 15

## 2021-03-03 MED ORDER — PROPOFOL 10 MG/ML IV BOLUS
INTRAVENOUS | Status: DC | PRN
  Administered 2021-03-03: 40 mg via INTRAVENOUS

## 2021-03-03 MED ORDER — PANTOPRAZOLE SODIUM 40 MG PO TBEC: 40.0000 mg | DELAYED_RELEASE_TABLET | Freq: Every day | ORAL | Status: DC

## 2021-03-03 MED ORDER — AMIODARONE HCL IN DEXTROSE 360-4.14 MG/200ML-% IV SOLN
30.0000 mg/h | INTRAVENOUS | Status: DC
  Administered 2021-03-03: 30 mg/h via INTRAVENOUS

## 2021-03-03 MED ORDER — PREDNISONE 5 MG PO TABS
6.0000 mg | ORAL_TABLET | Freq: Every day | ORAL | Status: DC
  Administered 2021-03-04: 6 mg
  Filled 2021-03-03: qty 1

## 2021-03-03 MED ORDER — INSULIN REGULAR(HUMAN) IN NACL 100-0.9 UT/100ML-% IV SOLN
INTRAVENOUS | Status: DC
  Administered 2021-03-04: 5.5 [IU]/h via INTRAVENOUS
  Filled 2021-03-03: qty 100

## 2021-03-03 MED ORDER — ASPIRIN 81 MG PO CHEW
324.0000 mg | CHEWABLE_TABLET | Freq: Every day | ORAL | Status: DC
  Administered 2021-03-04: 324 mg
  Filled 2021-03-03 (×2): qty 4

## 2021-03-03 MED ORDER — DEXTROSE 50 % IV SOLN: 0.0000 mL | INTRAVENOUS | Status: DC | PRN

## 2021-03-03 MED ORDER — PLASMA-LYTE A IV SOLN
INTRAVENOUS | Status: DC | PRN
  Administered 2021-03-03 (×2): 1000 mL via INTRAVASCULAR

## 2021-03-03 MED ORDER — PREDNISONE 1 MG PO TABS: 1.0000 mg | ORAL_TABLET | Freq: Every day | ORAL | Status: DC

## 2021-03-03 MED ORDER — BISACODYL 5 MG PO TBEC
10.0000 mg | DELAYED_RELEASE_TABLET | Freq: Every day | ORAL | Status: DC
  Administered 2021-03-04 – 2021-03-11 (×3): 10 mg via ORAL
  Filled 2021-03-03 (×4): qty 2

## 2021-03-03 MED ORDER — PROTAMINE SULFATE 10 MG/ML IV SOLN
INTRAVENOUS | Status: AC
  Filled 2021-03-03: qty 5

## 2021-03-03 MED ORDER — NITROGLYCERIN 0.2 MG/ML ON CALL CATH LAB
INTRAVENOUS | Status: DC | PRN
  Administered 2021-03-03 (×2): 20 ug via INTRAVENOUS
  Administered 2021-03-03: 40 ug via INTRAVENOUS

## 2021-03-03 MED ORDER — FAMOTIDINE IN NACL 20-0.9 MG/50ML-% IV SOLN
20.0000 mg | Freq: Two times a day (BID) | INTRAVENOUS | Status: AC
  Administered 2021-03-03 (×2): 20 mg via INTRAVENOUS
  Filled 2021-03-03 (×2): qty 50

## 2021-03-03 MED ORDER — AMIODARONE HCL IN DEXTROSE 360-4.14 MG/200ML-% IV SOLN: 60.0000 mg/h | INTRAVENOUS | Status: DC

## 2021-03-03 MED ORDER — CEFAZOLIN SODIUM-DEXTROSE 2-4 GM/100ML-% IV SOLN
2.0000 g | Freq: Three times a day (TID) | INTRAVENOUS | Status: AC
  Administered 2021-03-04 – 2021-03-05 (×5): 2 g via INTRAVENOUS
  Filled 2021-03-03 (×5): qty 100

## 2021-03-03 MED ORDER — SODIUM CHLORIDE 0.9 % IV SOLN: INTRAVENOUS | Status: DC | PRN

## 2021-03-03 MED ORDER — NITROGLYCERIN IN D5W 200-5 MCG/ML-% IV SOLN: 0.0000 ug/min | INTRAVENOUS | Status: DC

## 2021-03-03 MED ORDER — SODIUM CHLORIDE 0.9% FLUSH: 3.0000 mL | INTRAVENOUS | Status: DC | PRN

## 2021-03-03 MED ORDER — SUCCINYLCHOLINE CHLORIDE 200 MG/10ML IV SOSY
PREFILLED_SYRINGE | INTRAVENOUS | Status: AC
  Filled 2021-03-03: qty 10

## 2021-03-03 MED ORDER — SODIUM CHLORIDE 0.9% FLUSH
10.0000 mL | Freq: Two times a day (BID) | INTRAVENOUS | Status: DC
  Administered 2021-03-03: 20 mL
  Administered 2021-03-04 – 2021-03-11 (×9): 10 mL

## 2021-03-03 MED ORDER — LACTATED RINGERS IV SOLN
500.0000 mL | Freq: Once | INTRAVENOUS | Status: AC | PRN
  Administered 2021-03-03: 500 mL via INTRAVENOUS

## 2021-03-03 MED ORDER — DOCUSATE SODIUM 100 MG PO CAPS: 200.0000 mg | ORAL_CAPSULE | Freq: Every day | ORAL | Status: DC

## 2021-03-03 MED ORDER — FENTANYL CITRATE (PF) 250 MCG/5ML IJ SOLN
INTRAMUSCULAR | Status: DC | PRN
  Administered 2021-03-03: 100 ug via INTRAVENOUS
  Administered 2021-03-03: 200 ug via INTRAVENOUS
  Administered 2021-03-03: 100 ug via INTRAVENOUS
  Administered 2021-03-03: 50 ug via INTRAVENOUS
  Administered 2021-03-03: 100 ug via INTRAVENOUS
  Administered 2021-03-03: 150 ug via INTRAVENOUS
  Administered 2021-03-03: 100 ug via INTRAVENOUS

## 2021-03-03 MED ORDER — ROCURONIUM BROMIDE 10 MG/ML (PF) SYRINGE
PREFILLED_SYRINGE | INTRAVENOUS | Status: DC | PRN
Start: 2021-03-03 — End: 2021-03-03
  Administered 2021-03-03: 30 mg via INTRAVENOUS
  Administered 2021-03-03: 50 mg via INTRAVENOUS
  Administered 2021-03-03: 20 mg via INTRAVENOUS
  Administered 2021-03-03: 80 mg via INTRAVENOUS
  Administered 2021-03-03: 20 mg via INTRAVENOUS
  Administered 2021-03-03: 10 mg via INTRAVENOUS

## 2021-03-03 MED ORDER — ROCURONIUM BROMIDE 10 MG/ML (PF) SYRINGE
PREFILLED_SYRINGE | INTRAVENOUS | Status: AC
  Filled 2021-03-03: qty 10

## 2021-03-03 MED ORDER — SODIUM CHLORIDE 0.9 % IV SOLN: INTRAVENOUS | Status: DC

## 2021-03-03 MED ORDER — SODIUM CHLORIDE (PF) 0.9 % IJ SOLN
INTRAMUSCULAR | Status: AC
  Filled 2021-03-03: qty 10

## 2021-03-03 MED ORDER — SODIUM CHLORIDE 0.9% IV SOLUTION: Freq: Once | INTRAVENOUS | Status: DC

## 2021-03-03 MED ORDER — ONDANSETRON HCL 4 MG/2ML IJ SOLN: 4.0000 mg | Freq: Four times a day (QID) | INTRAMUSCULAR | Status: DC | PRN

## 2021-03-03 MED ORDER — MIDAZOLAM HCL (PF) 5 MG/ML IJ SOLN
INTRAMUSCULAR | Status: DC | PRN
  Administered 2021-03-03 (×3): 1 mg via INTRAVENOUS
  Administered 2021-03-03 (×2): 2 mg via INTRAVENOUS
  Administered 2021-03-03: 1 mg via INTRAVENOUS

## 2021-03-03 MED ORDER — EPHEDRINE 5 MG/ML INJ
INTRAVENOUS | Status: AC
  Filled 2021-03-03: qty 5

## 2021-03-03 MED ORDER — NICARDIPINE HCL IN NACL 20-0.86 MG/200ML-% IV SOLN: 0.0000 mg/h | INTRAVENOUS | Status: DC

## 2021-03-03 MED ORDER — PHENYLEPHRINE HCL-NACL 20-0.9 MG/250ML-% IV SOLN
0.0000 ug/min | INTRAVENOUS | Status: DC
  Administered 2021-03-04: 25 ug/min via INTRAVENOUS
  Filled 2021-03-03: qty 250

## 2021-03-03 MED ORDER — PROPOFOL 10 MG/ML IV BOLUS
INTRAVENOUS | Status: AC
  Filled 2021-03-03: qty 20

## 2021-03-03 MED ORDER — ROCURONIUM BROMIDE 10 MG/ML (PF) SYRINGE
PREFILLED_SYRINGE | INTRAVENOUS | Status: AC
  Filled 2021-03-03: qty 20

## 2021-03-03 MED ORDER — SODIUM CHLORIDE 0.9% FLUSH
3.0000 mL | Freq: Two times a day (BID) | INTRAVENOUS | Status: DC
  Administered 2021-03-04 – 2021-03-11 (×12): 3 mL via INTRAVENOUS

## 2021-03-03 MED ORDER — AMIODARONE HCL IN DEXTROSE 360-4.14 MG/200ML-% IV SOLN
INTRAVENOUS | Status: AC
  Filled 2021-03-03: qty 200

## 2021-03-03 MED ORDER — BISACODYL 10 MG RE SUPP: 10.0000 mg | Freq: Every day | RECTAL | Status: DC

## 2021-03-03 MED ORDER — FENTANYL CITRATE PF 50 MCG/ML IJ SOSY
50.0000 ug | PREFILLED_SYRINGE | INTRAMUSCULAR | Status: DC | PRN
  Administered 2021-03-03 – 2021-03-06 (×5): 50 ug via INTRAVENOUS
  Filled 2021-03-03 (×5): qty 1

## 2021-03-03 MED ORDER — PYRIDOSTIGMINE BROMIDE 60 MG PO TABS
30.0000 mg | ORAL_TABLET | ORAL | Status: AC
  Administered 2021-03-03: 30 mg via ORAL
  Filled 2021-03-03: qty 0.5

## 2021-03-03 MED ORDER — MAGNESIUM SULFATE 4 GM/100ML IV SOLN
4.0000 g | Freq: Once | INTRAVENOUS | Status: AC
  Administered 2021-03-03: 4 g via INTRAVENOUS
  Filled 2021-03-03: qty 100

## 2021-03-03 MED ORDER — METOPROLOL TARTRATE 25 MG/10 ML ORAL SUSPENSION: 12.5000 mg | Freq: Two times a day (BID) | ORAL | Status: DC

## 2021-03-03 MED ORDER — POTASSIUM CHLORIDE 10 MEQ/50ML IV SOLN: 10.0000 meq | INTRAVENOUS | Status: AC

## 2021-03-03 MED ORDER — SODIUM CHLORIDE (PF) 0.9 % IJ SOLN: OROMUCOSAL | Status: DC | PRN

## 2021-03-03 MED ORDER — PROTAMINE SULFATE 10 MG/ML IV SOLN
INTRAVENOUS | Status: DC | PRN
  Administered 2021-03-03: 330 mg via INTRAVENOUS

## 2021-03-03 MED ORDER — ALBUMIN HUMAN 5 % IV SOLN: INTRAVENOUS | Status: DC | PRN

## 2021-03-03 MED ORDER — DOCUSATE SODIUM 50 MG/5ML PO LIQD
200.0000 mg | Freq: Every day | ORAL | Status: DC
  Administered 2021-03-04: 200 mg
  Filled 2021-03-03: qty 20

## 2021-03-03 MED ORDER — PANTOPRAZOLE 2 MG/ML SUSPENSION
40.0000 mg | Freq: Every day | ORAL | Status: DC
  Administered 2021-03-04: 40 mg
  Filled 2021-03-03: qty 20

## 2021-03-03 MED ORDER — TRAMADOL HCL 50 MG PO TABS
50.0000 mg | ORAL_TABLET | ORAL | Status: DC | PRN
  Administered 2021-03-04: 50 mg
  Filled 2021-03-03: qty 1

## 2021-03-03 MED ORDER — SODIUM CHLORIDE 0.9% FLUSH: 10.0000 mL | INTRAVENOUS | Status: DC | PRN

## 2021-03-03 MED ORDER — MIDAZOLAM HCL (PF) 10 MG/2ML IJ SOLN
INTRAMUSCULAR | Status: AC
  Filled 2021-03-03: qty 2

## 2021-03-03 MED ORDER — ACETAMINOPHEN 500 MG PO TABS
1000.0000 mg | ORAL_TABLET | Freq: Four times a day (QID) | ORAL | Status: AC
  Administered 2021-03-04 – 2021-03-08 (×17): 1000 mg via ORAL
  Filled 2021-03-03 (×17): qty 2

## 2021-03-03 MED ORDER — CHLORHEXIDINE GLUCONATE 0.12% ORAL RINSE (MEDLINE KIT)
15.0000 mL | Freq: Two times a day (BID) | OROMUCOSAL | Status: DC
  Administered 2021-03-03 – 2021-03-04 (×2): 15 mL via OROMUCOSAL

## 2021-03-03 MED ORDER — CHLORHEXIDINE GLUCONATE CLOTH 2 % EX PADS
6.0000 | MEDICATED_PAD | Freq: Every day | CUTANEOUS | Status: DC
  Administered 2021-03-04 – 2021-03-11 (×5): 6 via TOPICAL

## 2021-03-03 MED ORDER — FENTANYL CITRATE (PF) 250 MCG/5ML IJ SOLN
INTRAMUSCULAR | Status: AC
  Filled 2021-03-03: qty 25

## 2021-03-03 MED ORDER — TRAMADOL HCL 50 MG PO TABS: 50.0000 mg | ORAL_TABLET | ORAL | Status: DC | PRN

## 2021-03-03 MED ORDER — PROTAMINE SULFATE 10 MG/ML IV SOLN
INTRAVENOUS | Status: AC
  Filled 2021-03-03: qty 25

## 2021-03-03 MED ORDER — DEXMEDETOMIDINE HCL IN NACL 200 MCG/50ML IV SOLN
INTRAVENOUS | Status: AC
  Filled 2021-03-03: qty 50

## 2021-03-03 MED ORDER — DOBUTAMINE IN D5W 4-5 MG/ML-% IV SOLN: 0.0000 ug/kg/min | INTRAVENOUS | Status: DC

## 2021-03-03 MED ORDER — ~~LOC~~ CARDIAC SURGERY, PATIENT & FAMILY EDUCATION
Freq: Once | Status: DC
  Filled 2021-03-03: qty 1

## 2021-03-03 MED ORDER — AMIODARONE HCL IN DEXTROSE 360-4.14 MG/200ML-% IV SOLN
30.0000 mg/h | INTRAVENOUS | Status: DC
  Administered 2021-03-03 – 2021-03-07 (×7): 30 mg/h via INTRAVENOUS
  Filled 2021-03-03 (×5): qty 200

## 2021-03-03 SURGICAL SUPPLY — 133 items
ADAPTER CARDIO PERF ANTE/RETRO (ADAPTER) ×4 IMPLANT
ADH SKN CLS APL DERMABOND .7 (GAUZE/BANDAGES/DRESSINGS) ×6
ADPR PRFSN 84XANTGRD RTRGD (ADAPTER) ×3
BAG DECANTER FOR FLEXI CONT (MISCELLANEOUS) ×8 IMPLANT
BLADE 15 SAFETY STRL DISP (BLADE) ×1 IMPLANT
BLADE CLIPPER SURG (BLADE) ×8 IMPLANT
BLADE STERNUM SYSTEM 6 (BLADE) ×8 IMPLANT
BLADE SURG 15 STRL LF DISP TIS (BLADE) ×3 IMPLANT
BLADE SURG 15 STRL SS (BLADE) ×4
BNDG CMPR MED 10X6 ELC LF (GAUZE/BANDAGES/DRESSINGS) ×6
BNDG ELASTIC 4X5.8 VLCR STR LF (GAUZE/BANDAGES/DRESSINGS) ×5 IMPLANT
BNDG ELASTIC 6X10 VLCR STRL LF (GAUZE/BANDAGES/DRESSINGS) ×2 IMPLANT
BNDG ELASTIC 6X5.8 VLCR STR LF (GAUZE/BANDAGES/DRESSINGS) ×4 IMPLANT
BNDG GAUZE ELAST 4 BULKY (GAUZE/BANDAGES/DRESSINGS) ×5 IMPLANT
CABLE SURGICAL S-101-97-12 (CABLE) ×4 IMPLANT
CANISTER SUCT 3000ML PPV (MISCELLANEOUS) ×8 IMPLANT
CANNULA GUNDRY RCSP 15FR (MISCELLANEOUS) ×4 IMPLANT
CANNULA MC2 2 STG 29/37 NON-V (CANNULA) ×3 IMPLANT
CANNULA MC2 TWO STAGE (CANNULA) ×8
CANNULA NON VENT 20FR 12 (CANNULA) ×8 IMPLANT
CANNULA NON VENT 22FR 12 (CANNULA) ×1 IMPLANT
CANNULA SUMP PERICARDIAL (CANNULA) ×1 IMPLANT
CATH CPB KIT HENDRICKSON (MISCELLANEOUS) ×4 IMPLANT
CATH HEART VENT LEFT (CATHETERS) ×3 IMPLANT
CATH ROBINSON RED A/P 18FR (CATHETERS) ×20 IMPLANT
CLIP RETRACTION 3.0MM CORONARY (MISCELLANEOUS) ×4 IMPLANT
CLIP VESOCCLUDE MED 24/CT (CLIP) IMPLANT
CLIP VESOCCLUDE SM WIDE 24/CT (CLIP) IMPLANT
CNTNR URN SCR LID CUP LEK RST (MISCELLANEOUS) ×3 IMPLANT
CONN ST 1/2X1/2  BEN (MISCELLANEOUS) ×8
CONN ST 1/2X1/2 BEN (MISCELLANEOUS) ×6 IMPLANT
CONN ST 1/4X3/8  BEN (MISCELLANEOUS) ×8
CONN ST 1/4X3/8 BEN (MISCELLANEOUS) ×6 IMPLANT
CONNECTOR BLAKE 2:1 CARIO BLK (MISCELLANEOUS) ×4 IMPLANT
CONT SPEC 4OZ STRL OR WHT (MISCELLANEOUS) ×4
CONTAINER PROTECT SURGISLUSH (MISCELLANEOUS) ×14 IMPLANT
COUNTER NEEDLE 20 DBL MAG RED (NEEDLE) ×2 IMPLANT
COVER SURGICAL LIGHT HANDLE (MISCELLANEOUS) ×5 IMPLANT
DERMABOND ADVANCED (GAUZE/BANDAGES/DRESSINGS) ×2
DERMABOND ADVANCED .7 DNX12 (GAUZE/BANDAGES/DRESSINGS) IMPLANT
DEVICE SUT CK QUICK LOAD MINI (Prosthesis & Implant Heart) ×1 IMPLANT
DRAIN CHANNEL 19F RND (DRAIN) ×12 IMPLANT
DRAIN CHANNEL 28F RND 3/8 FF (WOUND CARE) ×8 IMPLANT
DRAIN CONNECTOR BLAKE 1:1 (MISCELLANEOUS) ×4 IMPLANT
DRAPE CARDIOVASCULAR INCISE (DRAPES) ×8
DRAPE HALF SHEET 40X57 (DRAPES) ×1 IMPLANT
DRAPE INCISE IOBAN 66X45 STRL (DRAPES) ×1 IMPLANT
DRAPE SRG 135X102X78XABS (DRAPES) ×6 IMPLANT
DRAPE WARM FLUID 44X44 (DRAPES) ×8 IMPLANT
DRESSING AQUACEL AG SP 3.5X10 (GAUZE/BANDAGES/DRESSINGS) IMPLANT
DRSG AQUACEL AG ADV 3.5X10 (GAUZE/BANDAGES/DRESSINGS) ×4 IMPLANT
DRSG AQUACEL AG ADV 3.5X14 (GAUZE/BANDAGES/DRESSINGS) ×1 IMPLANT
DRSG AQUACEL AG SP 3.5X10 (GAUZE/BANDAGES/DRESSINGS) ×4
DRSG COVADERM 4X14 (GAUZE/BANDAGES/DRESSINGS) ×8 IMPLANT
ELECT BLADE 4.0 EZ CLEAN MEGAD (MISCELLANEOUS) ×4
ELECT CAUTERY BLADE 6.4 (BLADE) ×4 IMPLANT
ELECT REM PT RETURN 9FT ADLT (ELECTROSURGICAL) ×16
ELECTRODE BLDE 4.0 EZ CLN MEGD (MISCELLANEOUS) ×3 IMPLANT
ELECTRODE REM PT RTRN 9FT ADLT (ELECTROSURGICAL) ×12 IMPLANT
FELT TEFLON 1X6 (MISCELLANEOUS) ×16 IMPLANT
GAUZE SPONGE 4X4 12PLY STRL (GAUZE/BANDAGES/DRESSINGS) ×15 IMPLANT
GLOVE EUDERMIC 7 POWDERFREE (GLOVE) ×8 IMPLANT
GLOVE SURG ENC MOIS LTX SZ6 (GLOVE) IMPLANT
GLOVE SURG ENC MOIS LTX SZ6.5 (GLOVE) IMPLANT
GLOVE SURG ENC MOIS LTX SZ7 (GLOVE) ×20 IMPLANT
GLOVE SURG ENC MOIS LTX SZ7.5 (GLOVE) IMPLANT
GLOVE SURG ENC TEXT LTX SZ7.5 (GLOVE) ×8 IMPLANT
GLOVE SURG MICRO LTX SZ6 (GLOVE) ×1 IMPLANT
GLOVE SURG NEOPR MICRO LF SZ7 (GLOVE) ×1 IMPLANT
GLOVE SURG SYN 7.5  E (GLOVE) ×4
GLOVE SURG SYN 7.5 E (GLOVE) ×3 IMPLANT
GLOVE SURG SYN 7.5 PF PI (GLOVE) IMPLANT
GOWN STRL REUS W/ TWL LRG LVL3 (GOWN DISPOSABLE) ×24 IMPLANT
GOWN STRL REUS W/ TWL XL LVL3 (GOWN DISPOSABLE) ×12 IMPLANT
GOWN STRL REUS W/TWL LRG LVL3 (GOWN DISPOSABLE) ×32
GOWN STRL REUS W/TWL XL LVL3 (GOWN DISPOSABLE) ×16
HEMOSTAT POWDER KIT SURGIFOAM (HEMOSTASIS) ×4 IMPLANT
HEMOSTAT POWDER SURGIFOAM 1G (HEMOSTASIS) ×24 IMPLANT
HEMOSTAT SURGICEL 2X14 (HEMOSTASIS) ×5 IMPLANT
INSERT FOGARTY XLG (MISCELLANEOUS) ×1 IMPLANT
INSERT SUTURE HOLDER (MISCELLANEOUS) ×8 IMPLANT
KIT BASIN OR (CUSTOM PROCEDURE TRAY) ×8 IMPLANT
KIT SUCTION CATH 14FR (SUCTIONS) ×8 IMPLANT
KIT SUT CK MINI COMBO 4X17 (Prosthesis & Implant Heart) ×1 IMPLANT
KIT TURNOVER KIT B (KITS) ×8 IMPLANT
KIT VASOVIEW HEMOPRO 2 VH 4000 (KITS) ×4 IMPLANT
LEAD PACING MYOCARDI (MISCELLANEOUS) ×5 IMPLANT
MARKER GRAFT CORONARY BYPASS (MISCELLANEOUS) ×12 IMPLANT
NS IRRIG 1000ML POUR BTL (IV SOLUTION) ×44 IMPLANT
PACK ACCESSORY CANNULA KIT (KITS) ×4 IMPLANT
PACK E OPEN HEART (SUTURE) ×8 IMPLANT
PACK OPEN HEART (CUSTOM PROCEDURE TRAY) ×8 IMPLANT
PAD ARMBOARD 7.5X6 YLW CONV (MISCELLANEOUS) ×16 IMPLANT
PAD ELECT DEFIB RADIOL ZOLL (MISCELLANEOUS) ×4 IMPLANT
PENCIL BUTTON HOLSTER BLD 10FT (ELECTRODE) ×4 IMPLANT
POSITIONER HEAD DONUT 9IN (MISCELLANEOUS) ×8 IMPLANT
PUNCH AORTIC ROTATE 4.0MM (MISCELLANEOUS) ×4 IMPLANT
SUPPORT HEART JANKE-BARRON (MISCELLANEOUS) ×4 IMPLANT
SUT BONE WAX W31G (SUTURE) ×8 IMPLANT
SUT EB EXC GRN/WHT 2-0 V-5 (SUTURE) ×8 IMPLANT
SUT ETHIBOND X763 2 0 SH 1 (SUTURE) ×8 IMPLANT
SUT MNCRL AB 3-0 PS2 18 (SUTURE) ×10 IMPLANT
SUT PDS AB 1 CTX 36 (SUTURE) ×10 IMPLANT
SUT PROLENE 3 0 SH DA (SUTURE) IMPLANT
SUT PROLENE 3 0 SH1 36 (SUTURE) ×4 IMPLANT
SUT PROLENE 4 0 RB 1 (SUTURE) ×20
SUT PROLENE 4 0 SH DA (SUTURE) ×4 IMPLANT
SUT PROLENE 4-0 RB1 .5 CRCL 36 (SUTURE) ×9 IMPLANT
SUT PROLENE 5 0 C 1 36 (SUTURE) ×14 IMPLANT
SUT PROLENE 7 0 BV 1 (SUTURE) ×2 IMPLANT
SUT PROLENE 7 0 BV1 MDA (SUTURE) ×4 IMPLANT
SUT STEEL 6MS V (SUTURE) ×8 IMPLANT
SUT STEEL STERNAL CCS#1 18IN (SUTURE) ×2 IMPLANT
SUT STEEL SZ 6 DBL 3X14 BALL (SUTURE) ×1 IMPLANT
SUT VIC AB 1 CTX 36 (SUTURE) ×4
SUT VIC AB 1 CTX36XBRD ANBCTR (SUTURE) IMPLANT
SUT VIC AB 2-0 CT1 27 (SUTURE) ×4
SUT VIC AB 2-0 CT1 TAPERPNT 27 (SUTURE) IMPLANT
SUT VIC AB 2-0 CTX 27 (SUTURE) ×2 IMPLANT
SUT VICRYL 3-0 27 CRC X-1 (SUTURE) ×1 IMPLANT
SYSTEM SAHARA CHEST DRAIN ATS (WOUND CARE) ×9 IMPLANT
TAPE CLOTH SURG 4X10 WHT LF (GAUZE/BANDAGES/DRESSINGS) ×1 IMPLANT
TAPE PAPER 2X10 WHT MICROPORE (GAUZE/BANDAGES/DRESSINGS) ×1 IMPLANT
TOWEL GREEN STERILE (TOWEL DISPOSABLE) ×8 IMPLANT
TOWEL GREEN STERILE FF (TOWEL DISPOSABLE) ×8 IMPLANT
TRAY FOLEY SLVR 16FR TEMP STAT (SET/KITS/TRAYS/PACK) ×8 IMPLANT
TUBE CONNECTING 12X1/4 (SUCTIONS) ×1 IMPLANT
TUBING LAP HI FLOW INSUFFLATIO (TUBING) ×4 IMPLANT
UNDERPAD 30X36 HEAVY ABSORB (UNDERPADS AND DIAPERS) ×8 IMPLANT
VALVE AORTIC SZ25 INSP/RESIL (Prosthesis & Implant Heart) ×1 IMPLANT
VENT LEFT HEART 12002 (CATHETERS) ×4
WATER STERILE IRR 1000ML POUR (IV SOLUTION) ×16 IMPLANT
YANKAUER SUCT BULB TIP NO VENT (SUCTIONS) ×1 IMPLANT

## 2021-03-03 NOTE — Interval H&P Note (Signed)
History and Physical Interval Note:  03/03/2021 7:03 AM  Edward [name]  has presented today for surgery, with the diagnosis of AS, CAD.  The various methods of treatment have been discussed with the patient and family. After consideration of risks, benefits and other options for treatment, the patient has consented to  Procedure(s): AORTIC VALVE REPLACEMENT (AVR) WITH BIOPROSTHETIC VALVE (N/A) CORONARY ARTERY BYPASS GRAFTING (CABG) (N/A) TRANSESOPHAGEAL ECHOCARDIOGRAM (TEE) (N/A) as a surgical intervention.  The patient's history has been reviewed, patient examined, no change in status, stable for surgery.  I have reviewed the patient's chart and labs.  Questions were answered to the patient's satisfaction.     [name]

## 2021-03-03 NOTE — Op Note (Signed)
[address]       [place] [address]             [phone]                                          03/03/2021 Patient:  Edward [name] Pre-Op Dx: 3V CAD Moderate Aortic stenosis Obesity Steroid Dependence Occular Myasthenia Gravis    Post-op Dx:  Same Procedure: Aortic valve replacement with 31mm Inspiris Resilia valve CABG X 4.  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  In regards to the coronary artery disease.  I personally reviewed the left heart cath.  He does have good distal targets on all 3 walls.  The right coronary is completely occluded and fills from left to right collaterals, however there does appear to be a good target on the PDA.  On the lateral wall there are 2 good targets that we will plan to bypass.  The LAD is also a good target, and there is potentially a second diagonal vessel that will require bypassing.  His echocardiogram was done in June of this year.  His valve area measured 1.02 cm, and his mean gradient was 12 mmHg.  His aortic root diameter was 3.4 cm.  Based off of his left heart cath he has a mean gradient of 22 mmHg, and this was done in October 2022.  In regards to his myasthenia he is on prednisone and Mestinon, and apparently only has ocular symptoms.  I did review his calcium scoring CT scan from August of this  year, and it does not appear to show a thymoma.  I have discussed this case with his neurologist, there is no benefit of performing a thymectomy in the setting of ocular symptoms.  Will need to make plans to wean his prednisone prior to surgery.  He will also require stress test steroids in the perioperative period.  He is agreeable to proceed with a bioprosthetic aortic valve replacement along with a CABG.  Findings: Good conduit, good LIMA.  Calcified LAD.  Good PAD, small OM2, good sized diagonal.  Tricuspid aortic valve.  Heavily calcified.    Operative Technique: All invasive lines were placed in pre-op holding.  After the risks, benefits and alternatives were thoroughly discussed, the patient was brought to the operative theatre.  Anesthesia was induced, and the patient was prepped and draped in normal sterile fashion.  An appropriate surgical pause was performed, and pre-operative antibiotics were dosed accordingly.  We began with simultaneous incisions along the right leg for harvesting of the greater saphenous vein and the chest for the sternotomy.  In regards to the sternotomy, this was carried down with  bovie cautery, and the sternum was divided with a reciprocating saw.  Meticulous hemostasis was obtained.  The left internal thoracic artery was exposed and harvested in in pedicled fashion.  The patient was systemically heparinized, and the artery was divided distally, and placed in a papaverine sponge.    The sternal elevator was removed, and a retractor was placed.  The pericardium was divided in the midline and fashioned into a cradle with pericardial stitches.   After we confirmed an appropriate ACT, the ascending aorta was cannulated in standard fashion.  The right atrial appendage was used for venous cannulation site.  Cardiopulmonary bypass was initiated, and the heart retractor was placed. The cross clamp was applied, and a dose of anterograde cardioplegia was given with good arrest of  the heart.  We moved to the posterior wall of the heart, and found a good target on the PDA.  An arteriotomy was made, and the vein graft was anastomosed to it in an end to side fashion.  Next we exposed the lateral wall, and found a good target on the OM2.  An end to side anastomosis with the vein graft was then created.  Next, we exposed the anterior wall of the heart and identified a good target on 2nd diagonal.   An arteriotomy was created.  The vein was anastomosed in an end to side fashion.  Finally, we exposed a good target on the LAD, and fashioned an end to side anastomosis between it and the LITA.    Another dose of anterograde cardioplegia was given with good arrest of the heart.  Our aortotomy was made and directed toward the non coronary cusp.  The valve was inspected.  It was tri-leaflet but heavily calcified.  All leaflets were excised.  The annulus was sized to a 78mm Inspiris valve.  The left ventricle was then copiously irrigated.  Pledgeted mattress sutures were placed circumferentially through the annulus.  These sutures were then passed through the sewing ring of the valve.  Once the valve was seated in the annulus, it was secured with Core-knot sutures.  We began to rewarm, and close our aortotomy in 2 layers.  We began to re-warm, and we created an end to side anastomosis between it and the proximal vein grafts.  The proximal sites were  marked with rings.  The diagonal graft was jumped off the hood of the marginal vein graft.  A re-animation dose of cardioplegia was given.  The heart was de-aired, and the cross clamp was removed.  Meticulous hemostasis was obtained.  We separated from cardiopulmonary bypass without event.  The heparin was reversed with protamine.  Chest tubes and wires were placed, and the sternum was re-approximated with sternal wires.  The soft tissue and skin were re-approximated wth absorbable suture.    After closure, higher than normal chest tube output was noted.   We re-entered the chest, and found a small bleeder coming off the inferior surface of the sternum.  It was controlled with cautery.  The chest was then closed in standard fashion.  The patient tolerated the procedure without any immediate complications, and was transferred to the ICU in guarded condition.  Stacie Templin Keane Scrape

## 2021-03-03 NOTE — Consult Note (Signed)
NAME:  Edward [name], MRN:  [id number], DOB:  1945-09-24, LOS: 0 ADMISSION DATE:  03/03/2021, CONSULTATION DATE:  03/03/2021 REFERRING MD:  Kipp Brood , CHIEF COMPLAINT:  post-op respiratory failure.   History of Present Illness:  75 year old man post AVR, CABG  Presented in August with increasing chest pain radiating to right arm with improvement with antianginal therapy.  He then underwent cardiac catheterization 10/20 which revealed severe three-vessel disease with at least moderate aortic stenosis with a mean gradient of 22 and calculated aortic valve area of 1.5 cm.  LV filling pressures were normal at that time.  On 11/9 he underwent bioprosthetic aortic valve replacement, and CABG x4 with a LIMA to LAD and saphenous vein grafts to the PDA OM 2 and second diagonal.  Patient had high chest tube output in the operating room so was reopened and a sternal bleeding vessel was cauterized.  Pertinent  Medical History   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 by echo 07/2017   Chronic kidney disease    kidney stones   Colon polyps    Coronary artery disease 2022   Diplopia 10/15/2015   Diverticulosis    DVT (deep venous thrombosis) ([place])    2018, 2019   Erectile dysfunction    GERD (gastroesophageal reflux disease)    with Barrett's esophagus   Gout    Hearing loss    hearing aides   Heart murmur    pt says he has had it since he was a child, no issues ever mentioned   HTN (hypertension)    Hypercholesteremia    Migraine headache    Myasthenia gravis (HCC)    Obesity    OSA (obstructive sleep apnea)    AHI 43/hr now on CPAP at 10cm H2O   Peripheral neuropathy 01/14/2019   Right foot drop 12/03/2018     Significant Hospital Events: Including procedures, antibiotic start and stop dates in addition to other pertinent events   11/9 Aortic valve replacement with  61mm Inspiris Resilia valve CABG X 4.  LIMA LAD, RSVG PDA, OM2, D2 (y-graft off OM2 vein, Endoscopic greater saphenous vein harvest on the right and left thigh   Interim History / Subjective:  Arrived in ICU on phenylephrine.  Initially hypotensive started on blood transfusion with improvement with fluid administration.  Objective   Blood pressure (!) 117/57, pulse 75, resp. rate 17, height 5\' 9"  (1.753 m), weight 92.5 kg, SpO2 97 %.        Intake/Output Summary (Last 24 hours) at 03/03/2021 1123 Last data filed at 03/03/2021 1100 Gross per 24 hour  Intake 1900 ml  Output 210 ml  Net 1690 ml   Filed Weights   03/03/21 0657  Weight: 92.5 kg    Examination: General: Class I obesity.  Intubated sedated,  HENT: Orally intubated, 8 mm tube, OG tube in place Lungs: Clear to auscultation bilaterally Cardiovascular: Heart sounds distant, no rub.  Sternal wound dressing in place.  Serosanguineous drainage from mediastinal and pleural tubes. Abdomen: Protuberant abdomen soft and nontender Extremities: Bilateral saphenous vein graft sites bandaged. Neuro: Remains sedated. GU: Clear urine output.  Ancillary tests:  CXR reviewed all lines in place.  Clear lung fields.   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  Preferentially wean phenylephrine off. -Transfuse PRBCs to maintain hemoglobin greater than 9. -Check coagulation profile and  correct as necessary  Best Practice (right click and "Reselect all SmartList Selections" daily)   Diet/type: NPO DVT prophylaxis: not indicated GI prophylaxis: H2B Lines: Central line Foley:  Yes, and it is still needed Code Status:  full code Last date of multidisciplinary goals of care discussion [per CTS]  Labs   CBC: Recent Labs  Lab 03/01/21 1548 03/03/21 0908 03/03/21 0917 03/03/21 1040 03/03/21 1057 03/03/21 1108  WBC 9.1  --   --   --   --   --   HGB 12.8* 10.9* 12.2* 14.3 8.8* 10.9*  HCT 39.9 32.0* 36.0* 42.0 26.0* 32.0*  MCV 91.5  --   --   --   --   --   PLT 187  --   --   --   --   --     Basic Metabolic Panel: Recent Labs  Lab 03/01/21 1548 03/03/21 0908 03/03/21 0917 03/03/21 1040 03/03/21 1057 03/03/21 1108  NA 138 141 141 141 143 142  K 4.1 4.1 4.1 4.3 4.5 4.2  CL 108 107  --  107  --   --   CO2 20*  --   --   --   --   --   GLUCOSE 142* 96  --  102*  --   --   BUN 26* 17  --  20  --   --   CREATININE 1.31* 1.30*  --  1.20  --   --   CALCIUM 9.0  --   --   --   --   --    GFR: Estimated Creatinine Clearance: 60.7 mL/min (by C-G formula based on SCr of 1.2 mg/dL). Recent Labs  Lab 03/01/21 1548  WBC 9.1    Liver Function Tests: Recent Labs  Lab 03/01/21 1548  AST 34  ALT 25  ALKPHOS 45  BILITOT 1.0  PROT 6.4*  ALBUMIN 3.5   No results for input(s): LIPASE, AMYLASE in the last 168 hours. No results for input(s): AMMONIA in the last 168 hours.  ABG    Component Value Date/Time   PHART 7.422 03/03/2021 1108   PCO2ART 38.2 03/03/2021 1108   PO2ART 330 (H) 03/03/2021 1108   HCO3 24.9 03/03/2021 1108   TCO2 26 03/03/2021 1108   ACIDBASEDEF 1.0 03/03/2021 1057   O2SAT 100.0 03/03/2021 1108     Coagulation Profile: Recent Labs  Lab 03/01/21 1548  INR 1.1    Cardiac Enzymes: No results for input(s): CKTOTAL, CKMB, CKMBINDEX, TROPONINI in the last 168 hours.  HbA1C: Hgb A1c MFr Bld  Date/Time Value Ref Range Status   03/01/2021 02:48 PM 6.2 (H) 4.8 - 5.6 % Final    Comment:    (NOTE) Pre diabetes:          5.7%-6.4%  Diabetes:              >6.4%  Glycemic control for   <7.0% adults with diabetes     CBG: No results for input(s): GLUCAP in the last 168 hours.    CRITICAL CARE Performed by: Daleen Bo  Byrant Valent   Total critical care time: 35 minutes  Critical care time was exclusive of separately billable procedures and treating other patients.  Critical care was necessary to treat or prevent imminent or life-threatening deterioration.  Critical care was time spent personally by me on the following activities: development of treatment plan with patient and/or surrogate as well as nursing, discussions with consultants, evaluation of patient's response to treatment, examination of patient, obtaining history from patient or surrogate, ordering and performing treatments and interventions, ordering and review of laboratory studies, ordering and review of radiographic studies, pulse oximetry, re-evaluation of patient's condition and participation in multidisciplinary rounds.  Kipp Brood, MD [facility] ICU Physician [place]  Pager: [phone] Mobile: [phone] After hours: [phone].

## 2021-03-03 NOTE — Anesthesia Procedure Notes (Signed)
Central Venous Catheter Insertion Performed by: Suzette Battiest, MD, anesthesiologist Start/End11/12/2020 7:55 AM, 03/03/2021 8:10 AM Patient location: Pre-op. Preanesthetic checklist: patient identified, IV checked, site marked, risks and benefits discussed, surgical consent, monitors and equipment checked, pre-op evaluation, timeout performed and anesthesia consent Position: Trendelenburg Lidocaine 1% used for infiltration and patient sedated Hand hygiene performed , maximum sterile barriers used  and Seldinger technique used Catheter size: 9 Fr Total catheter length 10. Central line was placed.MAC introducer Swan type:thermodilution Procedure performed using ultrasound guided technique. Ultrasound Notes:anatomy identified, needle tip was noted to be adjacent to the nerve/plexus identified, no ultrasound evidence of intravascular and/or intraneural injection and image(s) printed for medical record Attempts: 1 Following insertion, line sutured, dressing applied and Biopatch. Post procedure assessment: blood return through all ports, free fluid flow and no air  Patient tolerated the procedure well with no immediate complications.

## 2021-03-03 NOTE — Transfer of Care (Signed)
Immediate Anesthesia Transfer of Care Note  Patient: Edward [name]  Procedure(s) Performed: AORTIC VALVE REPLACEMENT (AVR) WITH BIOPROSTHETIC VALVE, Inpiris Resila Aortic Valve 66mm (Chest) CORONARY ARTERY BYPASS GRAFTING (CABG) x four on pump, using left internal mammary artery, left and right endoscopic greater saphenous veins conduits (Chest) TRANSESOPHAGEAL ECHOCARDIOGRAM (TEE) ENDOVEIN HARVEST OF GREATER SAPHENOUS VEIN (Bilateral) APPLICATION OF CELL SAVER MEDIASTINAL EXPLORATION (Chest)  Patient Location: ICU  Anesthesia Type:General  Level of Consciousness: Patient remains intubated per anesthesia plan  Airway & Oxygen Therapy: Patient remains intubated per anesthesia plan and Patient placed on Ventilator (see vital sign flow sheet for setting)  Post-op Assessment: Report given to RN and Post -op Vital signs reviewed and stable  Post vital signs: Reviewed and stable  Last Vitals:  Vitals Value Taken Time  BP 110/75 03/03/21 1707  Temp 36.1 C 03/03/21 1720  Pulse 70 03/03/21 1720  Resp 17 03/03/21 1720  SpO2 97 % 03/03/21 1720  Vitals shown include unvalidated device data.  Last Pain:  Vitals:   03/03/21 0701  TempSrc:   PainSc: 0-No pain         Complications: No notable events documented.

## 2021-03-03 NOTE — Anesthesia Procedure Notes (Signed)
Procedure Name: Intubation Date/Time: 03/03/2021 8:50 AM Performed by: Erick Colace, CRNA Pre-anesthesia Checklist: Patient identified, Emergency Drugs available, Suction available and Patient being monitored Patient Re-evaluated:Patient Re-evaluated prior to induction Oxygen Delivery Method: Circle system utilized Preoxygenation: Pre-oxygenation with 100% oxygen Induction Type: IV induction Ventilation: Mask ventilation without difficulty Laryngoscope Size: Mac and 4 Grade View: Grade III Tube type: Oral Tube size: 8.0 mm Number of attempts: 1 Airway Equipment and Method: Stylet and Oral airway Placement Confirmation: ETT inserted through vocal cords under direct vision, positive ETCO2 and breath sounds checked- equal and bilateral Secured at: 23 cm Tube secured with: Tape Dental Injury: Teeth and Oropharynx as per pre-operative assessment

## 2021-03-03 NOTE — Brief Op Note (Signed)
03/03/2021  7:20 AM  PATIENT:  Edward [name]  75 y.o. male  PRE-OPERATIVE DIAGNOSIS:  Aortic Stenosis, Coronary Artery Disease  POST-OPERATIVE DIAGNOSIS:  Aortic Stenosis, Coronary Artery Disease  PROCEDURE:  Procedure(s): AORTIC VALVE REPLACEMENT (AVR) WITH BIOPROSTHETIC VALVE, Inpiris Resila Aortic Valve 40mm (N/A) CORONARY ARTERY BYPASS GRAFTING (CABG) x four on pump, using left internal mammary artery, left and right endoscopic greater saphenous veins conduits (N/A) TRANSESOPHAGEAL ECHOCARDIOGRAM (TEE) (N/A) ENDOVEIN HARVEST OF GREATER SAPHENOUS VEIN (Bilateral) APPLICATION OF CELL SAVER (N/A) MEDIASTINAL EXPLORATION (N/A) LIMA-LAD SVG-OM SVG-PD SVG-OM EVH 65 MIN  SURGEON:  Surgeon(s) and Role:    * Lightfoot, Eliezer Lofts, MD - Primary  PHYSICIAN ASSISTANT: Annia Gomm PA-C  ASSISTANTS: STAFF   ANESTHESIA:   general  EBL:  800 mL   BLOOD ADMINISTERED:none  DRAINS:  3 CHEST TUBES    LOCAL MEDICATIONS USED:  NONE  SPECIMEN:  Source of Specimen:  AORTIC VALVE LEAFLETS  DISPOSITION OF SPECIMEN:  PATHOLOGY  COUNTS:  YES  TOURNIQUET:  * No tourniquets in log *  DICTATION: .Dragon Dictation  PLAN OF CARE: Admit to inpatient   PATIENT DISPOSITION:  ICU - intubated and hemodynamically stable.   Delay start of Pharmacological VTE agent (>24hrs) due to surgical blood loss or risk of bleeding: yes  COMPLICATIONS: NO KNOWN

## 2021-03-03 NOTE — Progress Notes (Signed)
  Echocardiogram Echocardiogram Transesophageal has been performed.  Edward [name] 03/03/2021, 9:31 AM

## 2021-03-03 NOTE — OR Nursing (Signed)
210cc in plureavac drainage at 1545 MD made aware.Patient to be reopened

## 2021-03-03 NOTE — Anesthesia Procedure Notes (Signed)
Arterial Line Insertion Start/End11/12/2020 7:45 AM, 03/03/2021 7:50 AM Performed by: CRNA  Patient location: Pre-op. Preanesthetic checklist: patient identified, IV checked, site marked, risks and benefits discussed, surgical consent, monitors and equipment checked, pre-op evaluation, timeout performed and anesthesia consent Lidocaine 1% used for infiltration Left, radial was placed Catheter size: 20 G Hand hygiene performed  and maximum sterile barriers used   Attempts: 1 Procedure performed without using ultrasound guided technique. Following insertion, dressing applied and Biopatch. Post procedure assessment: normal and unchanged

## 2021-03-03 NOTE — Anesthesia Preprocedure Evaluation (Signed)
Anesthesia Evaluation  Patient identified by MRN, date of birth, ID band Patient awake    Reviewed: Allergy & Precautions, NPO status , Patient's Chart, lab work & pertinent test results  Airway Mallampati: III  TM Distance: >3 FB Neck ROM: Full    Dental  (+) Dental Advisory Given   Pulmonary sleep apnea ,    breath sounds clear to auscultation       Cardiovascular hypertension, Pt. on medications and Pt. on home beta blockers + angina + CAD and + DVT  + Valvular Problems/Murmurs AS  Rhythm:Regular Rate:Normal     Neuro/Psych  Headaches,  Neuromuscular disease    GI/Hepatic Neg liver ROS, GERD  ,  Endo/Other  negative endocrine ROS  Renal/GU Renal disease     Musculoskeletal   Abdominal   Peds  Hematology negative hematology ROS (+)   Anesthesia Other Findings   Reproductive/Obstetrics                             Lab Results  Component Value Date   WBC 9.1 03/01/2021   HGB 12.8 (L) 03/01/2021   HCT 39.9 03/01/2021   MCV 91.5 03/01/2021   PLT 187 03/01/2021   Lab Results  Component Value Date   CREATININE 1.31 (H) 03/01/2021   BUN 26 (H) 03/01/2021   NA 138 03/01/2021   K 4.1 03/01/2021   CL 108 03/01/2021   CO2 20 (L) 03/01/2021    Anesthesia Physical Anesthesia Plan  ASA: 4  Anesthesia Plan: General   Post-op Pain Management:    Induction: Intravenous  PONV Risk Score and Plan: 2 and Treatment may vary due to age or medical condition  Airway Management Planned: Oral ETT  Additional Equipment: Arterial line, CVP and TEE  Intra-op Plan:   Post-operative Plan: Post-operative intubation/ventilation  Informed Consent: I have reviewed the patients History and Physical, chart, labs and discussed the procedure including the risks, benefits and alternatives for the proposed anesthesia with the patient or authorized representative who has indicated his/her understanding  and acceptance.     Dental advisory given  Plan Discussed with: CRNA  Anesthesia Plan Comments:         Anesthesia Quick Evaluation

## 2021-03-04 ENCOUNTER — Encounter (HOSPITAL_COMMUNITY): Payer: Self-pay | Admitting: Thoracic Surgery (Cardiothoracic Vascular Surgery)

## 2021-03-04 ENCOUNTER — Inpatient Hospital Stay (HOSPITAL_COMMUNITY): Payer: Medicare PPO

## 2021-03-04 DIAGNOSIS — J9601 Acute respiratory failure with hypoxia: Secondary | ICD-10-CM | POA: Diagnosis not present

## 2021-03-04 LAB — CBC
HCT: 24.6 % — ABNORMAL LOW (ref 39.0–52.0)
HCT: 22 % — ABNORMAL LOW (ref 39.0–52.0)
Hemoglobin: 8.4 g/dL — ABNORMAL LOW (ref 13.0–17.0)
Hemoglobin: 7.6 g/dL — ABNORMAL LOW (ref 13.0–17.0)
MCH: 30.2 pg (ref 26.0–34.0)
MCH: 30.8 pg (ref 26.0–34.0)
MCHC: 34.1 g/dL (ref 30.0–36.0)
MCHC: 34.5 g/dL (ref 30.0–36.0)
MCV: 88.5 fL (ref 80.0–100.0)
MCV: 89.1 fL (ref 80.0–100.0)
Platelets: 80 10*3/uL — ABNORMAL LOW (ref 150–400)
Platelets: 69 10*3/uL — ABNORMAL LOW (ref 150–400)
RBC: 2.78 MIL/uL — ABNORMAL LOW (ref 4.22–5.81)
RBC: 2.47 MIL/uL — ABNORMAL LOW (ref 4.22–5.81)
RDW: 14.6 % (ref 11.5–15.5)
RDW: 14.9 % (ref 11.5–15.5)
WBC: 9.8 10*3/uL (ref 4.0–10.5)
WBC: 11.8 10*3/uL — ABNORMAL HIGH (ref 4.0–10.5)
nRBC: 0 % (ref 0.0–0.2)
nRBC: 0 % (ref 0.0–0.2)

## 2021-03-04 LAB — GLUCOSE, CAPILLARY
Glucose-Capillary: 106 mg/dL — ABNORMAL HIGH (ref 70–99)
Glucose-Capillary: 107 mg/dL — ABNORMAL HIGH (ref 70–99)
Glucose-Capillary: 111 mg/dL — ABNORMAL HIGH (ref 70–99)
Glucose-Capillary: 118 mg/dL — ABNORMAL HIGH (ref 70–99)
Glucose-Capillary: 119 mg/dL — ABNORMAL HIGH (ref 70–99)
Glucose-Capillary: 127 mg/dL — ABNORMAL HIGH (ref 70–99)
Glucose-Capillary: 127 mg/dL — ABNORMAL HIGH (ref 70–99)
Glucose-Capillary: 131 mg/dL — ABNORMAL HIGH (ref 70–99)
Glucose-Capillary: 141 mg/dL — ABNORMAL HIGH (ref 70–99)
Glucose-Capillary: 142 mg/dL — ABNORMAL HIGH (ref 70–99)
Glucose-Capillary: 150 mg/dL — ABNORMAL HIGH (ref 70–99)
Glucose-Capillary: 154 mg/dL — ABNORMAL HIGH (ref 70–99)
Glucose-Capillary: 156 mg/dL — ABNORMAL HIGH (ref 70–99)
Glucose-Capillary: 164 mg/dL — ABNORMAL HIGH (ref 70–99)
Glucose-Capillary: 165 mg/dL — ABNORMAL HIGH (ref 70–99)
Glucose-Capillary: 171 mg/dL — ABNORMAL HIGH (ref 70–99)
Glucose-Capillary: 172 mg/dL — ABNORMAL HIGH (ref 70–99)
Glucose-Capillary: 172 mg/dL — ABNORMAL HIGH (ref 70–99)
Glucose-Capillary: 182 mg/dL — ABNORMAL HIGH (ref 70–99)
Glucose-Capillary: 86 mg/dL (ref 70–99)

## 2021-03-04 LAB — POCT I-STAT 7, (LYTES, BLD GAS, ICA,H+H)
Acid-base deficit: 3 mmol/L — ABNORMAL HIGH (ref 0.0–2.0)
Bicarbonate: 21.3 mmol/L (ref 20.0–28.0)
Calcium, Ion: 1.06 mmol/L — ABNORMAL LOW (ref 1.15–1.40)
HCT: 22 % — ABNORMAL LOW (ref 39.0–52.0)
Hemoglobin: 7.5 g/dL — ABNORMAL LOW (ref 13.0–17.0)
O2 Saturation: 98 %
Patient temperature: 38.2
Potassium: 3.7 mmol/L (ref 3.5–5.1)
Sodium: 141 mmol/L (ref 135–145)
TCO2: 22 mmol/L (ref 22–32)
pCO2 arterial: 36.6 mmHg (ref 32.0–48.0)
pH, Arterial: 7.378 (ref 7.350–7.450)
pO2, Arterial: 107 mmHg (ref 83.0–108.0)

## 2021-03-04 LAB — PROTIME-INR
INR: 1.4 — ABNORMAL HIGH (ref 0.8–1.2)
Prothrombin Time: 17.5 seconds — ABNORMAL HIGH (ref 11.4–15.2)

## 2021-03-04 LAB — BASIC METABOLIC PANEL
Anion gap: 6 (ref 5–15)
Anion gap: 6 (ref 5–15)
BUN: 13 mg/dL (ref 8–23)
BUN: 14 mg/dL (ref 8–23)
CO2: 23 mmol/L (ref 22–32)
CO2: 22 mmol/L (ref 22–32)
Calcium: 7.5 mg/dL — ABNORMAL LOW (ref 8.9–10.3)
Calcium: 7.4 mg/dL — ABNORMAL LOW (ref 8.9–10.3)
Chloride: 106 mmol/L (ref 98–111)
Chloride: 106 mmol/L (ref 98–111)
Creatinine, Ser: 1.05 mg/dL (ref 0.61–1.24)
Creatinine, Ser: 1.32 mg/dL — ABNORMAL HIGH (ref 0.61–1.24)
GFR, Estimated: >60 mL/min (ref >60)
GFR, Estimated: 57 mL/min — ABNORMAL LOW (ref >60)
Glucose, Bld: 150 mg/dL — ABNORMAL HIGH (ref 70–99)
Glucose, Bld: 122 mg/dL — ABNORMAL HIGH (ref 70–99)
Potassium: 4.2 mmol/L (ref 3.5–5.1)
Potassium: 4 mmol/L (ref 3.5–5.1)
Sodium: 135 mmol/L (ref 135–145)
Sodium: 134 mmol/L — ABNORMAL LOW (ref 135–145)

## 2021-03-04 LAB — MAGNESIUM
Magnesium: 2.7 mg/dL — ABNORMAL HIGH (ref 1.7–2.4)
Magnesium: 2.5 mg/dL — ABNORMAL HIGH (ref 1.7–2.4)

## 2021-03-04 LAB — FIBRINOGEN: Fibrinogen: 325 mg/dL (ref 210–475)

## 2021-03-04 LAB — SURGICAL PATHOLOGY

## 2021-03-04 MED ORDER — ALBUMIN HUMAN 5 % IV SOLN
INTRAVENOUS | Status: AC
  Administered 2021-03-04: 12.5 g
  Filled 2021-03-04: qty 250

## 2021-03-04 MED ORDER — OXYCODONE HCL 5 MG PO TABS
5.0000 mg | ORAL_TABLET | Freq: Four times a day (QID) | ORAL | Status: AC
  Administered 2021-03-04 (×2): 5 mg via ORAL
  Filled 2021-03-04 (×2): qty 1

## 2021-03-04 MED ORDER — PYRIDOSTIGMINE BROMIDE 60 MG PO TABS
30.0000 mg | ORAL_TABLET | Freq: Three times a day (TID) | ORAL | Status: DC
  Administered 2021-03-04 – 2021-03-12 (×23): 30 mg via ORAL
  Filled 2021-03-04 (×26): qty 0.5

## 2021-03-04 MED ORDER — DOCUSATE SODIUM 50 MG/5ML PO LIQD
200.0000 mg | Freq: Every day | ORAL | Status: DC
  Administered 2021-03-05 – 2021-03-11 (×3): 200 mg via ORAL
  Filled 2021-03-04 (×6): qty 20

## 2021-03-04 MED ORDER — OXYCODONE HCL 5 MG PO TABS: 5.0000 mg | ORAL_TABLET | ORAL | Status: DC | PRN

## 2021-03-04 MED ORDER — PANTOPRAZOLE 2 MG/ML SUSPENSION
40.0000 mg | Freq: Every day | ORAL | Status: DC
  Administered 2021-03-05 – 2021-03-12 (×8): 40 mg via ORAL
  Filled 2021-03-04 (×8): qty 20

## 2021-03-04 MED ORDER — OXYCODONE HCL 5 MG/5ML PO SOLN
5.0000 mg | Freq: Four times a day (QID) | ORAL | Status: DC
  Administered 2021-03-04: 5 mg
  Filled 2021-03-04: qty 5

## 2021-03-04 MED ORDER — ALBUMIN HUMAN 25 % IV SOLN: 12.5000 g | Freq: Once | INTRAVENOUS | Status: AC

## 2021-03-04 MED ORDER — ORAL CARE MOUTH RINSE
15.0000 mL | Freq: Two times a day (BID) | OROMUCOSAL | Status: DC
  Administered 2021-03-04 – 2021-03-11 (×9): 15 mL via OROMUCOSAL

## 2021-03-04 MED ORDER — PREDNISONE 5 MG PO TABS
6.0000 mg | ORAL_TABLET | Freq: Every day | ORAL | Status: DC
  Administered 2021-03-05 – 2021-03-12 (×8): 6 mg via ORAL
  Filled 2021-03-04 (×9): qty 1

## 2021-03-04 NOTE — Progress Notes (Signed)
      [address]       [place] [address]             [phone]      POD # 1 AVR, CABG  Sleeping currently  BP 109/82   Pulse 72   Temp 98.8 F (37.1 C)   Resp 16   Ht 5\' 9"  (1.753 m)   Wt 92.5 kg   SpO2 96%   BMI 30.13 kg/m  CVP= 10, CI= 2.8 4L [place] 97% sat On 1 mcg norepi   Intake/Output Summary (Last 24 hours) at 03/04/2021 2032 Last data filed at 03/04/2021 2000 Gross per 24 hour  Intake 3303.54 ml  Output 2195 ml  Net 1108.54 ml  K =4.0 Creatinine 1.32 Hct 22 PLT 69K down from 80K- monitor  [name] C. 13/01/2021, MD [facility] [phone]

## 2021-03-04 NOTE — Procedures (Signed)
Extubation Procedure Note  Patient Details:   Name: Edward [name] DOB: 02-Mar-1946 MRN: [id number]   Airway Documentation:    Vent end date: (not recorded) Vent end time: (not recorded)   Evaluation  O2 sats: stable throughout Complications: No apparent complications Patient did tolerate procedure well. Bilateral Breath Sounds: Clear, Diminished   Yes  Dewain Penning T 03/04/2021, 9:01 AM

## 2021-03-04 NOTE — Progress Notes (Addendum)
[facility] DAILY ICU PROGRESS NOTE                   [address]            [place] [address]          [phone]   1 Day Post-Op Procedure(s) (LRB): AORTIC VALVE REPLACEMENT (AVR) WITH BIOPROSTHETIC VALVE, Inpiris Resila Aortic Valve 71m (N/A) CORONARY ARTERY BYPASS GRAFTING (CABG) x four on pump, using left internal mammary artery, left and right endoscopic greater saphenous veins conduits (N/A) TRANSESOPHAGEAL ECHOCARDIOGRAM (TEE) (N/A) ENDOVEIN HARVEST OF GREATER SAPHENOUS VEIN (Bilateral) APPLICATION OF CELL SAVER (N/A) MEDIASTINAL EXPLORATION (N/A)  Total Length of Stay:  LOS: 1 day   Subjective: Patient opens eyes and is still is somewhat sedated   Objective: Vital signs in last 24 hours: Temp:  [96.4 F (35.8 C)-100 F (37.8 C)] 99.9 F (37.7 C) (11/10 0615) Pulse Rate:  [40-87] 73 (11/10 0615) Cardiac Rhythm: Normal sinus rhythm (11/10 0400) Resp:  [12-24] 18 (11/10 0615) BP: (88-141)/(51-79) 119/57 (11/10 0600) SpO2:  [97 %-100 %] 99 % (11/10 0615) Arterial Line BP: (83-262)/(44-119) 124/46 (11/10 0615) FiO2 (%):  [40 %-50 %] 50 % (11/10 0331) Weight:  [92.5 kg] 92.5 kg (11/09 0657)  Filed Weights   03/03/21 0657  Weight: 92.5 kg    Weight change:    Hemodynamic parameters for last 24 hours: CVP:  [3 mmHg-15 mmHg] 8 mmHg  Intake/Output from previous day: 11/09 0701 - 11/10 0700 In: 8353 [I.V.:4997.3; BNTIRW:4315 NG/GT:80; IV Piggyback:1609.7] Out: 4190 [Urine:2730; Emesis/NG output:50; Blood:800; Chest Tube:610]  Intake/Output this shift: Total I/O In: 2048 [I.V.:1023.3; Blood:436; NG/GT:80; IV Piggyback:508.7] Out: 14008[Urine:1180; Emesis/NG output:50; Chest Tube:460]  Current Meds: Scheduled Meds:  sodium chloride   Intravenous Once   sodium chloride   Intravenous Once   acetaminophen  1,000 mg Oral Q6H   Or   acetaminophen (TYLENOL) oral liquid 160 mg/5 mL  1,000 mg Per Tube Q6H   aspirin EC  325 mg Oral Daily   Or    aspirin  324 mg Per Tube Daily   bisacodyl  10 mg Oral Daily   Or   bisacodyl  10 mg Rectal Daily   chlorhexidine gluconate (MEDLINE KIT)  15 mL Mouth Rinse BID   Chlorhexidine Gluconate Cloth  6 each Topical Daily   Chlorhexidine Gluconate Cloth  6 each Topical Daily   docusate  200 mg Per Tube Daily   mouth rinse  15 mL Mouth Rinse 10 times per day   metoprolol tartrate  12.5 mg Oral BID   Or   metoprolol tartrate  12.5 mg Per Tube BID   pantoprazole sodium  40 mg Per Tube Daily   predniSONE  6 mg Per Tube Daily   pyridostigmine  30 mg Per Tube Q8H   sodium chloride flush  10-40 mL Intracatheter Q12H   sodium chloride flush  3 mL Intravenous Q12H   Continuous Infusions:  sodium chloride     sodium chloride     sodium chloride     amiodarone 30 mg/hr (03/04/21 0600)    ceFAZolin (ANCEF) IV 200 mL/hr at 03/04/21 0600   dexmedetomidine (PRECEDEX) IV infusion Stopped (03/04/21 0535)   DOBUTamine Stopped (03/03/21 1705)   insulin 1.9 Units/hr (03/03/21 1705)   lactated ringers 20 mL/hr at 03/03/21 1855   lactated ringers     niCARDipine     nitroGLYCERIN Stopped (03/03/21 1705)   norepinephrine (LEVOPHED) Adult infusion 18 mcg/min (  03/04/21 0600)   phenylephrine (NEO-SYNEPHRINE) Adult infusion 35 mcg/min (03/03/21 1705)   PRN Meds:.sodium chloride, dextrose, fentaNYL (SUBLIMAZE) injection, metoprolol tartrate, midazolam, ondansetron (ZOFRAN) IV, oxyCODONE, sodium chloride flush, sodium chloride flush, traMADol  General appearance: cooperative and no distress Neurologic: Still with sedation Heart: RRR Lungs: Diminished bibasilar breath sounds Abdomen: Soft, sporadic bowel sounds present Extremities: SCDs in place Wound: Sternal dressing intact;RLE dressing clean and dry  Lab Results: CBC: Recent Labs    03/03/21 2302 03/04/21 0528  WBC 10.7* 9.8  HGB 8.9* 8.4*  HCT 26.4* 24.6*  PLT 82* 80*   BMET:  Recent Labs    03/03/21 2302 03/04/21 0528  NA 138 135  K 4.7  4.2  CL 109 106  CO2 23 23  GLUCOSE 176* 150*  BUN 13 13  CREATININE 1.02 1.05  CALCIUM 7.5* 7.5*    CMET: Lab Results  Component Value Date   WBC 9.8 03/04/2021   HGB 8.4 (L) 03/04/2021   HCT 24.6 (L) 03/04/2021   PLT 80 (L) 03/04/2021   GLUCOSE 150 (H) 03/04/2021   CHOL 163 11/09/2020   TRIG 170 (H) 11/09/2020   HDL 49 11/09/2020   LDLCALC 85 11/09/2020   ALT 25 03/01/2021   AST 34 03/01/2021   NA 135 03/04/2021   K 4.2 03/04/2021   CL 106 03/04/2021   CREATININE 1.05 03/04/2021   BUN 13 03/04/2021   CO2 23 03/04/2021   TSH 1.500 10/15/2015   INR 1.4 (H) 03/04/2021   HGBA1C 6.2 (H) 03/01/2021      PT/INR:  Recent Labs    03/04/21 0530  LABPROT 17.5*  INR 1.4*   Radiology: DG Chest Port 1 View  Result Date: 03/03/2021 CLINICAL DATA:  Post CABG, AVR EXAM: PORTABLE CHEST 1 VIEW COMPARISON:  03/01/2021 FINDINGS: Interval intubation, tip of the endotracheal tube is about 4.9 cm superior to carina. Esophageal tube tip overlies the stomach. Interval post sternotomy changes and aortic valve prosthesis. Placement mediastinal and chest drainage catheters. Right IJ central venous catheter sheath tip over the SVC. No definitive pneumothorax. Small pleural effusions and basilar airspace disease. IMPRESSION: 1. Interim placement of support lines and tubes as above with interval postsurgical changes of the mediastinum. No visible pneumothorax 2. Small bilateral effusions with basilar airspace disease most likely atelectasis Electronically Signed   By: Donavan Foil M.D.   On: 03/03/2021 18:22     Assessment/Plan: S/P Procedure(s) (LRB): AORTIC VALVE REPLACEMENT (AVR) WITH BIOPROSTHETIC VALVE, Inpiris Resila Aortic Valve 54m (N/A) CORONARY ARTERY BYPASS GRAFTING (CABG) x four on pump, using left internal mammary artery, left and right endoscopic greater saphenous veins conduits (N/A) TRANSESOPHAGEAL ECHOCARDIOGRAM (TEE) (N/A) ENDOVEIN HARVEST OF GREATER SAPHENOUS VEIN  (Bilateral) APPLICATION OF CELL SAVER (N/A) MEDIASTINAL EXPLORATION (N/A) CV-CO/CI 4.9/2.6. SR. On Levophed drip;will wean as able.  Lopressor 12.5 mg bid, will be started in am. Pulmonary-On vent this am. Will wean to extubate. Chest tubes with 610 cc since surgery. CXR this am appears stable (bibasilar atelectasis, small pleural effusions). Encourage incentive spirometer once extubated. Volume overload-will not diurese until off Levophed  CBGs 154/164/150. Pre op HGA1C 6.2. Likely pre diabetes. Has been on steroids for MG. Once off Levophed, will transition off drip Expected post op blood loss anemia-H and H this am slightly decreased to 8.4 and 24.6 6. Thrombocytopenia-platelets this am 80,000 7. History of myasthenia gravis-on Prednisone and Mestinon. Will discuss with pulmonary/CCM if needs stress doses 8. Will remove a line once off Levophed;will remove foley  after extubation  Nani Skillern PA-C 03/04/2021 6:56 AM   Agree with above Will wean to extubate Will wean levo based on SBP 100-130 Creat stable, will hold off on diuresis until pt is off levo Ambulation once extubated SSI Low dose steroids today  Eithel Ryall O Lequan Dobratz

## 2021-03-04 NOTE — Hospital Course (Addendum)
  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  He also has a history of myasthenia gravis with mostly ocular symptoms.  He has had some progressive dyspnea, and underwent a left heart catheterization which identified the disease.  During the valve analysis is a gradients were consistent with moderate aortic stenosis.  In regards to his myasthenia he is on Mestinon as well as 6 mg of prednisone.  He is able to self titrate this based off of his symptoms.The patient and all relavent studies were evaluated by Dr Cliffton Asters who recommended proceeding with CABG/AVR and he was admitted for the procedure.   Hospital Course: Patient underwent a CABG x 4/AVR on 03/03/2021. He was going to be transferred from the OR to ICU;however, because of increased chest tube output, his sternum was re explored. There was a superficial bleeder identified. Patient was then transferred from the OR to [facility] ICU in stable condition. Patient was extubated 11/10. He was weaned off the Levophed drip. He was volume overloaded but not diuresed until off pressors. He was weaned off the Insulin drip on 11/11. His pre op HGA1C is 6.2. He likely has prediabetes and has been on steroids for Myasthenia Gravis for sometime. He did have expected post op blood loss anemia and required a transfusion on 11/11. He had thrombocytopenia post op. His platelet count went as low as 62K but recovered without intervention. A line and foley were removed on 11/11.  His expected acute blood loss anemia has stabilized.  He did develop a postoperative atrial fibrillation which was managed with beta-blocker and amiodarone.  He does have postoperative expected volume overload and is aggressively being diuresed.  His creatinine peaked at 1.32 and is stabilized to normal.  His chest tubes were removed on postop day #4.  He was started on a  course of routine cardiac rehab mobilization as well as aggressive pulmonary hygiene.  He is tachycardic at times and his lopressor was titrated accordingly.  He was felt medically stable for transfer to the progressive care unit on 03/10/2021. His creatinine increased to 1.67. Of note, patient has a history of CKD. His creatinine upon admission was 1.25. He had PAF and at times, HR was increased so Amiodarone was increased to 400 mg bid. He is already on Rivaroxaban 20 mg at supper.  The patient is maintaining NSR.  His surgical incisions are healing without evidence of infection.  He is medically stable for discharge home today.

## 2021-03-04 NOTE — Progress Notes (Signed)
NAME:  Edward [name], MRN:  [id number], DOB:  08/30/1945, LOS: 1 ADMISSION DATE:  03/03/2021, CONSULTATION DATE:  03/03/2021 REFERRING MD:  Kipp Brood , CHIEF COMPLAINT:  post-op respiratory failure.   History of Present Illness:  75 year old man post AVR, CABG  Presented in August with increasing chest pain radiating to right arm with improvement with antianginal therapy.  He then underwent cardiac catheterization 10/20 which revealed severe three-vessel disease with at least moderate aortic stenosis with a mean gradient of 22 and calculated aortic valve area of 1.5 cm.  LV filling pressures were normal at that time.  On 11/9 he underwent bioprosthetic aortic valve replacement, and CABG x4 with a LIMA to LAD and saphenous vein grafts to the PDA OM 2 and second diagonal.  Patient had high chest tube output in the operating room so was reopened and a sternal bleeding vessel was cauterized.  Pertinent  Medical History   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 by echo 07/2017   Chronic kidney disease    kidney stones   Colon polyps    Coronary artery disease 2022   Diplopia 10/15/2015   Diverticulosis    DVT (deep venous thrombosis) ([place])    2018, 2019   Erectile dysfunction    GERD (gastroesophageal reflux disease)    with Barrett's esophagus   Gout    Hearing loss    hearing aides   Heart murmur    pt says he has had it since he was a child, no issues ever mentioned   HTN (hypertension)    Hypercholesteremia    Migraine headache    Myasthenia gravis (HCC)    Obesity    OSA (obstructive sleep apnea)    AHI 43/hr now on CPAP at 10cm H2O   Peripheral neuropathy 01/14/2019   Right foot drop 12/03/2018     Significant Hospital Events: Including procedures, antibiotic start and stop dates in addition to other pertinent events   11/9 Aortic valve replacement with  84mm Inspiris Resilia valve CABG X 4.  LIMA LAD, RSVG PDA, OM2, D2 (y-graft off OM2 vein, Endoscopic greater saphenous vein harvest on the right and left thigh 11/9 required FFP and cryoprecipitate for bleeding overnight.  11/10 extubated.   Interim History / Subjective:  Passed SBT this morning   Objective   Blood pressure (!) 99/59, pulse 72, temperature (!) 100.6 F (38.1 C), resp. rate 19, height 5\' 9"  (1.753 m), weight 92.5 kg, SpO2 98 %. CVP:  [3 mmHg-15 mmHg] 13 mmHg  Vent Mode: PSV;SIMV;PRVC FiO2 (%):  [40 %-50 %] 50 % Set Rate:  [12 bmp] 12 bmp Vt Set:  [560 mL] 560 mL PEEP:  [5 cmH20] 5 cmH20 Pressure Support:  [5 cmH20-10 cmH20] 5 cmH20 Plateau Pressure:  [15 cmH20-17 cmH20] 16 cmH20   Intake/Output Summary (Last 24 hours) at 03/04/2021 1041 Last data filed at 03/04/2021 0900 Gross per 24 hour  Intake 8345.27 ml  Output 4290 ml  Net 4055.27 ml    Filed Weights   03/03/21 0657  Weight: 92.5 kg    Examination: General: Class I obesity. Extubated  HENT: hearing aids in place.  Lungs: Clear to auscultation bilaterally Cardiovascular: Heart sounds distant, no rub.  Sternal wound dressing in place.  Serosanguineous drainage from mediastinal and pleural tubes. Abdomen: Protuberant abdomen soft and nontender Extremities: Bilateral saphenous vein  graft sites bandaged. Neuro: Remains sedated. GU: Clear urine output.  Ancillary tests:  BMET normal.  Mild hyperglycemia HB: 8.4 CXR reviewed - increased bilateral markings. Poor inspiratory film with basilar atelectasis.   Assessment & Plan:  Was critically ill due to expected postoperative hypoxic hypercapnic respiratory failure requiring mechanical ventilation Still critically ill due to expected postoperative cardiogenic and distributive shock requiring titration of vasopressors and fluid administration Coronary artery disease status post CABG Moderate bicuspid aortic stenosis status post bioprosthetic  valve Postoperative coagulopathy Acute blood loss anemia  Plan:  -Extubated following SBT  - Incentive spirometry. Progressive ambulation - Multimodal pain control.  -Titrate norepinephrine and phenylephrine to keep MAP greater than 65.  Preferentially wean phenylephrine off. - No further fluid at this time.    Best Practice (right click and "Reselect all SmartList Selections" daily)   Diet/type: NPO - advance diet per protocol  DVT prophylaxis: not indicated - chemical prophylaxis tomorrow . GI prophylaxis: H2B Lines: Central line Foley:  Yes, and it is still needed Code Status:  full code Last date of multidisciplinary goals of care discussion updated at bedside 11/10  Labs   CBC: Recent Labs  Lab 03/01/21 1548 03/03/21 0908 03/03/21 1325 03/03/21 1331 03/03/21 1651 03/03/21 1715 03/03/21 2217 03/03/21 2302 03/04/21 0528  WBC 9.1  --   --   --   --  10.8*  --  10.7* 9.8  HGB 12.8*   < > 8.6*   < > 8.5* 8.8*  9.6* 8.2* 8.9* 8.4*  HCT 39.9   < > 25.5*   < > 25.0* 26.0*  29.1* 24.0* 26.4* 24.6*  MCV 91.5  --   --   --   --  89.5  --  89.5 88.5  PLT 187  --  111*  --   --  89*  --  82* 80*   < > = values in this interval not displayed.     Basic Metabolic Panel: Recent Labs  Lab 03/01/21 1548 03/03/21 0908 03/03/21 1307 03/03/21 1331 03/03/21 1417 03/03/21 1425 03/03/21 1651 03/03/21 1715 03/03/21 2217 03/03/21 2302 03/04/21 0528  NA 138   < > 140 138 141   < > 145 143 140 138 135  K 4.1   < > 5.8* 6.1* 4.9   < > 4.4 4.5 4.6 4.7 4.2  CL 108   < > 107 107 107  --   --   --   --  109 106  CO2 20*  --   --   --   --   --   --   --   --  23 23  GLUCOSE 142*   < > 155* 144* 147*  --   --   --   --  176* 150*  BUN 26*   < > 18 14 18   --   --   --   --  13 13  CREATININE 1.31*   < > 1.10 1.10 1.10  --   --   --   --  1.02 1.05  CALCIUM 9.0  --   --   --   --   --   --   --   --  7.5* 7.5*  MG  --   --   --   --   --   --   --   --   --   --  2.7*   < > =  values in this interval not  displayed.    GFR: Estimated Creatinine Clearance: 69.3 mL/min (by C-G formula based on SCr of 1.05 mg/dL). Recent Labs  Lab 03/01/21 1548 03/03/21 1715 03/03/21 2302 03/04/21 0528  WBC 9.1 10.8* 10.7* 9.8     Liver Function Tests: Recent Labs  Lab 03/01/21 1548  AST 34  ALT 25  ALKPHOS 45  BILITOT 1.0  PROT 6.4*  ALBUMIN 3.5    No results for input(s): LIPASE, AMYLASE in the last 168 hours. No results for input(s): AMMONIA in the last 168 hours.  ABG    Component Value Date/Time   PHART 7.384 03/03/2021 2217   PCO2ART 38.3 03/03/2021 2217   PO2ART 113 (H) 03/03/2021 2217   HCO3 22.9 03/03/2021 2217   TCO2 24 03/03/2021 2217   ACIDBASEDEF 2.0 03/03/2021 2217   O2SAT 98.0 03/03/2021 2217      Coagulation Profile: Recent Labs  Lab 03/01/21 1548 03/03/21 1715 03/03/21 2302 03/04/21 0530  INR 1.1 1.8* 1.6* 1.4*     Cardiac Enzymes: No results for input(s): CKTOTAL, CKMB, CKMBINDEX, TROPONINI in the last 168 hours.  HbA1C: Hgb A1c MFr Bld  Date/Time Value Ref Range Status  03/01/2021 02:48 PM 6.2 (H) 4.8 - 5.6 % Final    Comment:    (NOTE) Pre diabetes:          5.7%-6.4%  Diabetes:              >6.4%  Glycemic control for   <7.0% adults with diabetes     CBG: Recent Labs  Lab 03/04/21 0523 03/04/21 0608 03/04/21 0655 03/04/21 0804 03/04/21 0906  GLUCAP 154* 164* 150* 172* 142*    CRITICAL CARE Performed by: Kipp Brood  Total critical care time: 35 minutes  Critical care time was exclusive of separately billable procedures and treating other patients.  Critical care was necessary to treat or prevent imminent or life-threatening deterioration.  Critical care was time spent personally by me on the following activities: development of treatment plan with patient and/or surrogate as well as nursing, discussions with consultants, evaluation of patient's response to treatment, examination of patient,  obtaining history from patient or surrogate, ordering and performing treatments and interventions, ordering and review of laboratory studies, ordering and review of radiographic studies, pulse oximetry, re-evaluation of patient's condition and participation in multidisciplinary rounds.  Kipp Brood, MD [facility] ICU Physician [place]  Pager: [phone] Mobile: [phone] After hours: [phone].

## 2021-03-05 ENCOUNTER — Inpatient Hospital Stay (HOSPITAL_COMMUNITY): Payer: Medicare PPO

## 2021-03-05 DIAGNOSIS — R570 Cardiogenic shock: Secondary | ICD-10-CM | POA: Diagnosis not present

## 2021-03-05 LAB — GLUCOSE, CAPILLARY
Glucose-Capillary: 109 mg/dL — ABNORMAL HIGH (ref 70–99)
Glucose-Capillary: 110 mg/dL — ABNORMAL HIGH (ref 70–99)
Glucose-Capillary: 114 mg/dL — ABNORMAL HIGH (ref 70–99)
Glucose-Capillary: 118 mg/dL — ABNORMAL HIGH (ref 70–99)
Glucose-Capillary: 120 mg/dL — ABNORMAL HIGH (ref 70–99)
Glucose-Capillary: 120 mg/dL — ABNORMAL HIGH (ref 70–99)
Glucose-Capillary: 121 mg/dL — ABNORMAL HIGH (ref 70–99)
Glucose-Capillary: 130 mg/dL — ABNORMAL HIGH (ref 70–99)
Glucose-Capillary: 132 mg/dL — ABNORMAL HIGH (ref 70–99)
Glucose-Capillary: 139 mg/dL — ABNORMAL HIGH (ref 70–99)
Glucose-Capillary: 144 mg/dL — ABNORMAL HIGH (ref 70–99)
Glucose-Capillary: 152 mg/dL — ABNORMAL HIGH (ref 70–99)
Glucose-Capillary: 162 mg/dL — ABNORMAL HIGH (ref 70–99)
Glucose-Capillary: 91 mg/dL (ref 70–99)

## 2021-03-05 LAB — PREPARE CRYOPRECIPITATE: Unit division: 0

## 2021-03-05 LAB — CBC
HCT: 20.8 % — ABNORMAL LOW (ref 39.0–52.0)
HCT: 26.1 % — ABNORMAL LOW (ref 39.0–52.0)
Hemoglobin: 7 g/dL — ABNORMAL LOW (ref 13.0–17.0)
Hemoglobin: 8.6 g/dL — ABNORMAL LOW (ref 13.0–17.0)
MCH: 30.4 pg (ref 26.0–34.0)
MCH: 30 pg (ref 26.0–34.0)
MCHC: 33.7 g/dL (ref 30.0–36.0)
MCHC: 33 g/dL (ref 30.0–36.0)
MCV: 90.4 fL (ref 80.0–100.0)
MCV: 90.9 fL (ref 80.0–100.0)
Platelets: 62 10*3/uL — ABNORMAL LOW (ref 150–400)
Platelets: 68 10*3/uL — ABNORMAL LOW (ref 150–400)
RBC: 2.3 MIL/uL — ABNORMAL LOW (ref 4.22–5.81)
RBC: 2.87 MIL/uL — ABNORMAL LOW (ref 4.22–5.81)
RDW: 15.2 % (ref 11.5–15.5)
RDW: 14.8 % (ref 11.5–15.5)
WBC: 10.7 10*3/uL — ABNORMAL HIGH (ref 4.0–10.5)
WBC: 9.1 10*3/uL (ref 4.0–10.5)
nRBC: 0 % (ref 0.0–0.2)
nRBC: 0 % (ref 0.0–0.2)

## 2021-03-05 LAB — PREPARE FRESH FROZEN PLASMA: Unit division: 0

## 2021-03-05 LAB — BASIC METABOLIC PANEL
Anion gap: 6 (ref 5–15)
BUN: 15 mg/dL (ref 8–23)
CO2: 24 mmol/L (ref 22–32)
Calcium: 7.5 mg/dL — ABNORMAL LOW (ref 8.9–10.3)
Chloride: 105 mmol/L (ref 98–111)
Creatinine, Ser: 1.23 mg/dL (ref 0.61–1.24)
GFR, Estimated: >60 mL/min (ref >60)
Glucose, Bld: 114 mg/dL — ABNORMAL HIGH (ref 70–99)
Potassium: 3.9 mmol/L (ref 3.5–5.1)
Sodium: 135 mmol/L (ref 135–145)

## 2021-03-05 LAB — BPAM FFP
Blood Product Expiration Date: 202211132359
ISSUE DATE / TIME: 202211100125
Unit Type and Rh: 6200

## 2021-03-05 LAB — BPAM CRYOPRECIPITATE
Blood Product Expiration Date: 202211100526
ISSUE DATE / TIME: 202211100000
Unit Type and Rh: 6200

## 2021-03-05 LAB — CREATININE, SERUM
Creatinine, Ser: 1.43 mg/dL — ABNORMAL HIGH (ref 0.61–1.24)
GFR, Estimated: 51 mL/min — ABNORMAL LOW (ref >60)

## 2021-03-05 LAB — PREPARE RBC (CROSSMATCH)

## 2021-03-05 MED ORDER — SODIUM CHLORIDE 0.9% IV SOLUTION: Freq: Once | INTRAVENOUS | Status: AC

## 2021-03-05 MED ORDER — POTASSIUM CHLORIDE CRYS ER 20 MEQ PO TBCR
30.0000 meq | EXTENDED_RELEASE_TABLET | Freq: Once | ORAL | Status: AC
  Administered 2021-03-05: 30 meq via ORAL
  Filled 2021-03-05: qty 1

## 2021-03-05 MED ORDER — FUROSEMIDE 10 MG/ML IJ SOLN
40.0000 mg | Freq: Once | INTRAMUSCULAR | Status: AC
  Administered 2021-03-05: 40 mg via INTRAVENOUS
  Filled 2021-03-05: qty 4

## 2021-03-05 MED ORDER — ENOXAPARIN SODIUM 30 MG/0.3ML IJ SOSY
30.0000 mg | PREFILLED_SYRINGE | Freq: Every day | INTRAMUSCULAR | Status: DC
  Administered 2021-03-05 – 2021-03-07 (×3): 30 mg via SUBCUTANEOUS
  Filled 2021-03-05 (×3): qty 0.3

## 2021-03-05 MED ORDER — INSULIN ASPART 100 UNIT/ML IJ SOLN
0.0000 [IU] | INTRAMUSCULAR | Status: DC
  Administered 2021-03-05 – 2021-03-06 (×3): 2 [IU] via SUBCUTANEOUS

## 2021-03-05 MED ORDER — INSULIN ASPART 100 UNIT/ML IJ SOLN
0.0000 [IU] | Freq: Three times a day (TID) | INTRAMUSCULAR | Status: DC
  Administered 2021-03-05: 3 [IU] via SUBCUTANEOUS
  Administered 2021-03-05 – 2021-03-06 (×2): 2 [IU] via SUBCUTANEOUS
  Administered 2021-03-06: 3 [IU] via SUBCUTANEOUS
  Administered 2021-03-07: 2 [IU] via SUBCUTANEOUS
  Administered 2021-03-07: 3 [IU] via SUBCUTANEOUS
  Administered 2021-03-10 (×2): 2 [IU] via SUBCUTANEOUS

## 2021-03-05 MED ORDER — INSULIN DETEMIR 100 UNIT/ML ~~LOC~~ SOLN
5.0000 [IU] | Freq: Two times a day (BID) | SUBCUTANEOUS | Status: DC
Start: 2021-03-05 — End: 2021-03-11
  Administered 2021-03-05 – 2021-03-10 (×12): 5 [IU] via SUBCUTANEOUS
  Filled 2021-03-05 (×15): qty 0.05

## 2021-03-05 NOTE — Progress Notes (Signed)
Patient ID: Edward [name], male   DOB: December 23, 1945, 75 y.o.   MRN: [id number] TCTS Evening Rounds:  Hemodynamically stable in sinus rhythm on IV amio.  UO ok  CT output ok.  Ambulated twice today

## 2021-03-05 NOTE — Progress Notes (Addendum)
NAME:  Edward [name], MRN:  [id number], DOB:  1946/04/05, LOS: 2 ADMISSION DATE:  03/03/2021, CONSULTATION DATE:  03/03/2021 REFERRING MD:  Cliffton Asters , CHIEF COMPLAINT:  post-op respiratory failure.   History of Present Illness:  75 year old man post AVR and CABGx 4  Presented in August with increasing chest pain radiating to right arm with improvement with antianginal therapy.  He then underwent cardiac catheterization 10/20 which revealed severe three-vessel disease with at least moderate aortic stenosis with a mean gradient of 22 and calculated aortic valve area of 1.5 cm.  LV filling pressures were normal at that time.  On 11/9 he underwent bioprosthetic aortic valve replacement, and CABG x4 with a LIMA to LAD and saphenous vein grafts to the PDA OM 2 and second diagonal.  Patient had high chest tube output in the operating room so was reopened and a sternal bleeding vessel was cauterized.  Pertinent  Medical History   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 by echo 07/2017   Chronic kidney disease    kidney stones   Colon polyps    Coronary artery disease 2022   Diplopia 10/15/2015   Diverticulosis    DVT (deep venous thrombosis) (HCC)    2018, 2019   Erectile dysfunction    GERD (gastroesophageal reflux disease)    with Barrett's esophagus   Gout    Hearing loss    hearing aides   Heart murmur    pt says he has had it since he was a child, no issues ever mentioned   HTN (hypertension)    Hypercholesteremia    Migraine headache    Myasthenia gravis (HCC)    Obesity    OSA (obstructive sleep apnea)    AHI 43/hr now on CPAP at 10cm H2O   Peripheral neuropathy 01/14/2019   Right foot drop 12/03/2018   Significant Hospital Events: Including procedures, antibiotic start and stop dates in addition to other pertinent events   11/9 Aortic valve replacement with  83mm Inspiris Resilia valve CABG X 4.  LIMA LAD, RSVG PDA, OM2, D2 (y-graft off OM2 vein, Endoscopic greater saphenous vein harvest on the right and left thigh 11/9 required FFP and cryoprecipitate for bleeding overnight.  11/10 extubated.   Interim History / Subjective:  Sitting up in bedside recliner, no specific complaints Afebrile  Remains on [place] 4L  Being transfused 1u PRBC for Hgb 7 Transitioning off insulin gtt  Remains on IV amio, Neo at 20 mcg/min CVP 14  Objective   Blood pressure 104/60, pulse 67, temperature 98.8 F (37.1 C), resp. rate 18, height 5\' 9"  (1.753 m), weight 103.1 kg, SpO2 95 %. CVP:  [4 mmHg-39 mmHg] 14 mmHg      Intake/Output Summary (Last 24 hours) at 03/05/2021 1018 Last data filed at 03/05/2021 0900 Gross per 24 hour  Intake 2466.04 ml  Output 1183 ml  Net 1283.04 ml   Filed Weights   03/03/21 0657 03/05/21 0500  Weight: 92.5 kg 103.1 kg    Examination: General:  Overweight older male sitting in bedside recliner in NAD, family at bedside HEENT: MM pink/moist, R IJ CVL Neuro:  Alert/ oriented, MAE CV: rr, NSR, distant heart sounds, sternal dressing cdi, mediastinal ct x 2, pacing wires in place PULM:  non labored, clear anteriorly, bibasilar rales GI: obese, soft, NT, +bs, foley  Extremities: warm/dry,  1+ LE edema, feet slightly cool, LE dressings cdi  Remains on flotrak  UOP 608 ml/ 24hrs, net +5.3L CT output 705 ml/ 24hrs   Ancillary tests:   Glucose trend stable  sCr 1.32 > 1.23 Mag 2.5 WBC 11.8 > 10.7 Hgb 7.6 > 7 Plts 80 > 69 > 62  CXR reviewed - unchanged bilateral lower hazy opacities; cardiomegaly, mediastinal drains stable   Assessment & Plan:  Was critically ill due to expected postoperative hypoxic hypercapnic respiratory failure requiring mechanical ventilation Still critically ill due to expected postoperative cardiogenic and distributive shock requiring titration of vasopressors and fluid administration Coronary artery  disease status post CABGx4 Moderate bicuspid aortic stenosis status post bioprosthetic valve Postoperative coagulopathy Acute blood loss anemia - extubated 11/10 Plan: - TCTS primary, defer mediastinal CT / pacing wires  - remains in NSR, amio gtt per TCTS  - continue aggressive pulm hygiene/ IS/ progressive mobility, wean O2 as tolerated - ongoing multimodal pain control> tylenol, ultram, oxy IR, fentanyl w/bowel regimen - insulin gtt off today, transition to SSI moderate with levemir 5units BID - Neo almost off, goal MAP > 65, likely can d/c aline today  - 1 unit PRBC for Hgb 7, plts stable, trend CBC/ monitor for bleeding - ASA and BB per TCTS  - stable renal function, will start diuresis today w/ KCL replete - continue prednisone and mestinon for hx MG  Best Practice (right click and "Reselect all SmartList Selections" daily)   Diet/type: Regular consistency (see orders) - advance diet per protocol  DVT prophylaxis: not indicated - chemical prophylaxis per TCTS GI prophylaxis: PPI Lines: Central line Foley:  Yes, and it is still needed Code Status:  full code Last date of multidisciplinary goals of care discussion updated at bedside 11/11  Labs   CBC: Recent Labs  Lab 03/03/21 1715 03/03/21 2217 03/03/21 2302 03/04/21 0528 03/04/21 0848 03/04/21 1641 03/05/21 0419  WBC 10.8*  --  10.7* 9.8  --  11.8* 10.7*  HGB 8.8*  9.6*   < > 8.9* 8.4* 7.5* 7.6* 7.0*  HCT 26.0*  29.1*   < > 26.4* 24.6* 22.0* 22.0* 20.8*  MCV 89.5  --  89.5 88.5  --  89.1 90.4  PLT 89*  --  82* 80*  --  69* 62*   < > = values in this interval not displayed.    Basic Metabolic Panel: Recent Labs  Lab 03/01/21 1548 03/03/21 0908 03/03/21 1417 03/03/21 1425 03/03/21 2302 03/04/21 0528 03/04/21 0848 03/04/21 1641 03/05/21 0419  NA 138   < > 141   < > 138 135 141 134* 135  K 4.1   < > 4.9   < > 4.7 4.2 3.7 4.0 3.9  CL 108   < > 107  --  109 106  --  106 105  CO2 20*  --   --   --  23 23   --  22 24  GLUCOSE 142*   < > 147*  --  176* 150*  --  122* 114*  BUN 26*   < > 18  --  13 13  --  14 15  CREATININE 1.31*   < > 1.10  --  1.02 1.05  --  1.32* 1.23  CALCIUM 9.0  --   --   --  7.5* 7.5*  --  7.4* 7.5*  MG  --   --   --   --   --  2.7*  --  2.5*  --    < > = values in this interval not displayed.   GFR: Estimated Creatinine Clearance: 62.4 mL/min (by C-G formula based on SCr of 1.23 mg/dL). Recent Labs  Lab 03/03/21 2302 03/04/21 0528 03/04/21 1641 03/05/21 0419  WBC 10.7* 9.8 11.8* 10.7*    Liver Function Tests: Recent Labs  Lab 03/01/21 1548  AST 34  ALT 25  ALKPHOS 45  BILITOT 1.0  PROT 6.4*  ALBUMIN 3.5   No results for input(s): LIPASE, AMYLASE in the last 168 hours. No results for input(s): AMMONIA in the last 168 hours.  ABG    Component Value Date/Time   PHART 7.378 03/04/2021 0848   PCO2ART 36.6 03/04/2021 0848   PO2ART 107 03/04/2021 0848   HCO3 21.3 03/04/2021 0848   TCO2 22 03/04/2021 0848   ACIDBASEDEF 3.0 (H) 03/04/2021 0848   O2SAT 98.0 03/04/2021 0848     Coagulation Profile: Recent Labs  Lab 03/01/21 1548 03/03/21 1715 03/03/21 2302 03/04/21 0530  INR 1.1 1.8* 1.6* 1.4*    Cardiac Enzymes: No results for input(s): CKTOTAL, CKMB, CKMBINDEX, TROPONINI in the last 168 hours.  HbA1C: Hgb A1c MFr Bld  Date/Time Value Ref Range Status  03/01/2021 02:48 PM 6.2 (H) 4.8 - 5.6 % Final    Comment:    (NOTE) Pre diabetes:          5.7%-6.4%  Diabetes:              >6.4%  Glycemic control for   <7.0% adults with diabetes     CBG: Recent Labs  Lab 03/05/21 0343 03/05/21 0425 03/05/21 0517 03/05/21 0624 03/05/21 0839  GLUCAP 109* 114* 121* 132* 110*    CRITICAL CARE Performed by: Posey Boyer  Total critical care time: 35 minutes   Posey Boyer, ACNP [facility] 03/05/2021, 10:18 AM  See Amion for pager If no response to pager, please call PCCM consult pager After 7:00 pm call  [place]     Critical care attending:  This is a gentleman with coronary artery disease status post CABG x4, moderate bicuspid aortic stenosis status post bioprosthetic valve, postoperative coagulopathy blood loss anemia.  He was intubated as expected and now extubated.  BP (!) 112/59   Pulse 73   Temp 98.8 F (37.1 C)   Resp (!) 24   Ht 5\' 9"  (1.753 m)   Wt 103.1 kg   SpO2 92%   BMI 33.57 kg/m   General: Elderly male status post CABG HEENT: Trach improvement Heart: Irregular S2 lungs: Clear to auscultation bilaterally no wheeze Abdomen: Obese  Labs: Reviewed  Assessment: Coronary artery disease status post CABG Respiratory failure, resolved assessment mechanical support now extubated Moderate bicuspid aortic stenosis status post aortic bioprosthetic valve Postoperative coagulopathy improving Postop anemia <Prednisone  Plan: Continue infusion Pulmonary hygiene, I-S mobility Up in bed up in chair as tolerated Pain control with Tylenol, Ultram and Oxy IR, as needed fentanyl with a bowel regimen Transition off insulin infusion today Off pressors, continue to wean as tolerated 1 unit of PRBCs Monitor platelets Aspirin and beta-blocker continue Continue diuresis, follow urine output Continue prednisone plus Mestinon for myasthenia gravis  This patient is critically ill with multiple organ system failure; which, requires frequent high complexity decision making, assessment, support, evaluation, and titration of therapies. This was completed through the application of advanced monitoring technologies and extensive interpretation of multiple databases. During this encounter critical care time was devoted to patient care services described  in this note for 32 minutes.   Josephine Igo, DO [facility] 03/05/2021 4:08 PM

## 2021-03-05 NOTE — Discharge Instructions (Addendum)
Discharge Instructions:  1. You may shower, please wash incisions daily with soap and water and keep dry.  If you wish to cover wounds with dressing you may do so but please keep clean and change daily.  No tub baths or swimming until incisions have completely healed.  If your incisions become red or develop any drainage please call our office at [phone]  2. No Driving until cleared by Dr. Lucilla Lame office and you are no longer using narcotic pain medications  3. Monitor your weight daily.. Please use the same scale and weigh at same time... If you gain 5-10 lbs in 48 hours with associated lower extremity swelling, please contact our office at [phone]  4. Fever of 101.5 for at least 24 hours with no source, please contact our office at [phone]  5. Activity- up as tolerated, please walk at least 3 times per day.  Avoid strenuous activity, no lifting, pushing, or pulling with your arms over 8-10 lbs for a minimum of 6 weeks  6. If any questions or concerns arise, please do not hesitate to contact our office at [phone]   Information on my medicine - XARELTO (rivaroxaban)  This medication education was reviewed with me or my healthcare representative as part of my discharge preparation.   WHY WAS XARELTO PRESCRIBED FOR YOU? Xarelto was prescribed to treat blood clots that may have been found in the veins of your legs (deep vein thrombosis) or in your lungs (pulmonary embolism) and to reduce the risk of them occurring again.  What do you need to know about Xarelto? Continue Xarelto 20mg  ONCE A DAY with your evening meal.  DO NOT stop taking Xarelto without talking to the health care provider who prescribed the medication.  Refill your prescription for 20 mg tablets before you run out.  After discharge, you should have regular check-up appointments with your healthcare provider that is prescribing your Xarelto.  In the future your dose may need to be changed if your  kidney function changes by a significant amount.  What do you do if you miss a dose? If you are taking Xarelto TWICE DAILY and you miss a dose, take it as soon as you remember. You may take two 15 mg tablets (total 30 mg) at the same time then resume your regularly scheduled 15 mg twice daily the next day.  If you are taking Xarelto ONCE DAILY and you miss a dose, take it as soon as you remember on the same day then continue your regularly scheduled once daily regimen the next day. Do not take two doses of Xarelto at the same time.   Important Safety Information Xarelto is a blood thinner medicine that can cause bleeding. You should call your healthcare provider right away if you experience any of the following: Bleeding from an injury or your nose that does not stop. Unusual colored urine (red or dark brown) or unusual colored stools (red or black). Unusual bruising for unknown reasons. A serious fall or if you hit your head (even if there is no bleeding).  Some medicines may interact with Xarelto and might increase your risk of bleeding while on Xarelto. To help avoid this, consult your healthcare provider or pharmacist prior to using any new prescription or non-prescription medications, including herbals, vitamins, non-steroidal anti-inflammatory drugs (NSAIDs) and supplements.  This website has more information on Xarelto: .

## 2021-03-05 NOTE — Anesthesia Postprocedure Evaluation (Signed)
Anesthesia Post Note  Patient: Edward [name]  Procedure(s) Performed: AORTIC VALVE REPLACEMENT (AVR) WITH BIOPROSTHETIC VALVE, Inpiris Resila Aortic Valve 44mm (Chest) CORONARY ARTERY BYPASS GRAFTING (CABG) x four on pump, using left internal mammary artery, left and right endoscopic greater saphenous veins conduits (Chest) TRANSESOPHAGEAL ECHOCARDIOGRAM (TEE) ENDOVEIN HARVEST OF GREATER SAPHENOUS VEIN (Bilateral) APPLICATION OF CELL SAVER MEDIASTINAL EXPLORATION (Chest)     Patient location during evaluation: SICU Anesthesia Type: General Level of consciousness: sedated Pain management: pain level controlled Vital Signs Assessment: post-procedure vital signs reviewed and stable Respiratory status: patient remains intubated per anesthesia plan Cardiovascular status: stable Postop Assessment: no apparent nausea or vomiting Anesthetic complications: no   No notable events documented.  Last Vitals:  Vitals:   03/05/21 0755 03/05/21 0800  BP:    Pulse: 68 67  Resp: (!) 22 (!) 22  Temp: 37.1 C 37.1 C  SpO2: 97% 97%    Last Pain:  Vitals:   03/05/21 0755  TempSrc: Oral  PainSc:                  Kennieth Rad

## 2021-03-05 NOTE — Progress Notes (Addendum)
T[facility] DAILY ICU PROGRESS NOTE                   [address]            [place] [id number]          [phone]   2 Days Post-Op Procedure(s) (LRB): AORTIC VALVE REPLACEMENT (AVR) WITH BIOPROSTHETIC VALVE, Inpiris Resila Aortic Valve 25mm (N/A) CORONARY ARTERY BYPASS GRAFTING (CABG) x four on pump, using left internal mammary artery, left and right endoscopic greater saphenous veins conduits (N/A) TRANSESOPHAGEAL ECHOCARDIOGRAM (TEE) (N/A) ENDOVEIN HARVEST OF GREATER SAPHENOUS VEIN (Bilateral) APPLICATION OF CELL SAVER (N/A) MEDIASTINAL EXPLORATION (N/A)  Total Length of Stay:  LOS: 2 days   Subjective: Patient sitting in chair. He has no specific complaint this am.  Objective: Vital signs in last 24 hours: Temp:  [98.6 F (37 C)-100.8 F (38.2 C)] 98.6 F (37 C) (11/11 0600) Pulse Rate:  [68-83] 79 (11/11 0600) Cardiac Rhythm: Normal sinus rhythm (11/11 0600) Resp:  [9-27] 17 (11/11 0600) BP: (90-146)/(52-110) 113/61 (11/11 0500) SpO2:  [86 %-100 %] 92 % (11/11 0600) Arterial Line BP: (-3-155)/(-18-70) 135/49 (11/11 0600) FiO2 (%):  [50 %] 50 % (11/10 0800) Weight:  [103.1 kg] 103.1 kg (11/11 0500)  Filed Weights   03/03/21 0657 03/05/21 0500  Weight: 92.5 kg 103.1 kg    Weight change:    Hemodynamic parameters for last 24 hours: CVP:  [4 mmHg-39 mmHg] 13 mmHg  Intake/Output from previous day: 11/10 0701 - 11/11 0700 In: 2538.7 [P.O.:450; I.V.:1826.8; IV Piggyback:261.8] Out: 1313 [Urine:608; Chest Tube:705]  Intake/Output this shift: Total I/O In: 1467.6 [I.V.:1267.6; IV Piggyback:200] Out: 768 [Urine:413; Chest Tube:355]  Current Meds: Scheduled Meds:  sodium chloride   Intravenous Once   sodium chloride   Intravenous Once   acetaminophen  1,000 mg Oral Q6H   Or   acetaminophen (TYLENOL) oral liquid 160 mg/5 mL  1,000 mg Per Tube Q6H   aspirin EC  325 mg Oral Daily   Or   aspirin  324 mg Per Tube Daily   bisacodyl  10 mg Oral Daily    Or   bisacodyl  10 mg Rectal Daily   Chlorhexidine Gluconate Cloth  6 each Topical Daily   Chlorhexidine Gluconate Cloth  6 each Topical Daily   docusate  200 mg Oral Daily   mouth rinse  15 mL Mouth Rinse BID   metoprolol tartrate  12.5 mg Oral BID   Or   metoprolol tartrate  12.5 mg Per Tube BID   oxyCODONE  5 mg Oral Q6H   pantoprazole sodium  40 mg Oral Daily   predniSONE  6 mg Oral Daily   pyridostigmine  30 mg Oral Q8H   sodium chloride flush  10-40 mL Intracatheter Q12H   sodium chloride flush  3 mL Intravenous Q12H   Continuous Infusions:  sodium chloride     sodium chloride Stopped (03/04/21 0855)   sodium chloride     amiodarone 30 mg/hr (03/05/21 0600)    ceFAZolin (ANCEF) IV Stopped (03/05/21 0535)   dexmedetomidine (PRECEDEX) IV infusion Stopped (03/04/21 1112)   DOBUTamine Stopped (03/03/21 1705)   insulin 2.4 Units/hr (03/05/21 0600)   lactated ringers 20 mL/hr at 03/03/21 1855   lactated ringers 20 mL/hr at 03/05/21 0600   niCARDipine     nitroGLYCERIN Stopped (03/03/21 1705)   norepinephrine (LEVOPHED) Adult infusion Stopped (03/04/21 2009)   phenylephrine (NEO-SYNEPHRINE) Adult infusion 20 mcg/min (03/05/21 0600)  PRN Meds:.sodium chloride, dextrose, fentaNYL (SUBLIMAZE) injection, metoprolol tartrate, midazolam, ondansetron (ZOFRAN) IV, oxyCODONE, sodium chloride flush, sodium chloride flush, traMADol  General appearance: cooperative and no distress Neurologic: Grossly intact without focal deficit Heart: RRR Lungs: Diminished bibasilar breath sounds Abdomen: Soft, sporadic bowel sounds present Extremities: SCDs in place Wound: Sternal dressing intact;RLE dressing clean and dry  Lab Results: CBC: Recent Labs    03/04/21 1641 03/05/21 0419  WBC 11.8* 10.7*  HGB 7.6* 7.0*  HCT 22.0* 20.8*  PLT 69* 62*    BMET:  Recent Labs    03/04/21 1641 03/05/21 0419  NA 134* 135  K 4.0 3.9  CL 106 105  CO2 22 24  GLUCOSE 122* 114*  BUN 14 15   CREATININE 1.32* 1.23  CALCIUM 7.4* 7.5*     CMET: Lab Results  Component Value Date   WBC 10.7 (H) 03/05/2021   HGB 7.0 (L) 03/05/2021   HCT 20.8 (L) 03/05/2021   PLT 62 (L) 03/05/2021   GLUCOSE 114 (H) 03/05/2021   CHOL 163 11/09/2020   TRIG 170 (H) 11/09/2020   HDL 49 11/09/2020   LDLCALC 85 11/09/2020   ALT 25 03/01/2021   AST 34 03/01/2021   NA 135 03/05/2021   K 3.9 03/05/2021   CL 105 03/05/2021   CREATININE 1.23 03/05/2021   BUN 15 03/05/2021   CO2 24 03/05/2021   TSH 1.500 10/15/2015   INR 1.4 (H) 03/04/2021   HGBA1C 6.2 (H) 03/01/2021      PT/INR:  Recent Labs    03/04/21 0530  LABPROT 17.5*  INR 1.4*    Radiology: No results found.   Assessment/Plan: S/P Procedure(s) (LRB): AORTIC VALVE REPLACEMENT (AVR) WITH BIOPROSTHETIC VALVE, Inpiris Resila Aortic Valve 68mm (N/A) CORONARY ARTERY BYPASS GRAFTING (CABG) x four on pump, using left internal mammary artery, left and right endoscopic greater saphenous veins conduits (N/A) TRANSESOPHAGEAL ECHOCARDIOGRAM (TEE) (N/A) ENDOVEIN HARVEST OF GREATER SAPHENOUS VEIN (Bilateral) APPLICATION OF CELL SAVER (N/A) MEDIASTINAL EXPLORATION (N/A)  CV-SR. On Amiodarone and Neo Synephrine drips and Lopressor 12.5 mg bid. Will discuss with Dr.Nike Southers if convert Amiodarone to oral. Wean off Neo Synephrine. He was on Xarelto prior to surgery for DVT Pulmonary-On vent this am. Will wean to extubate. Chest tubes with 705 cc last 24 hours. CXR this am appears stable (cardiomegaly,bibasilar atelectasis, small pleural effusions). Encourage incentive spirometer  Volume overload-Need to diurese but on Neo Synephrine CBGs 114/121/132. Pre op HGA1C 6.2. Likely pre diabetes. Has been on steroids for MG. Will wean off Insulin drip Expected post op blood loss anemia-H and H this am decreased to 7 and 20.8. Will transfuse 6. Thrombocytopenia-platelets this am decreased to 62,000. Platelets prior to surgery were 187,000. As discussed  with Dr. Cliffton Asters, monitor for now 7. History of myasthenia gravis-on Prednisone and Mestinon.  8. Supplement potassium 9. Remove foley;once off Neo, remove a line  Donielle Margaretann Loveless PA-C 03/05/2021 6:55 AM   Agree with above. A. fib overnight now back in sinus rhythm. Low-dose neo-. Will transfuse 1 unit of blood.  Tarissa Kerin Keane Scrape

## 2021-03-06 ENCOUNTER — Inpatient Hospital Stay (HOSPITAL_COMMUNITY): Payer: Medicare PPO

## 2021-03-06 DIAGNOSIS — Z951 Presence of aortocoronary bypass graft: Secondary | ICD-10-CM | POA: Diagnosis not present

## 2021-03-06 LAB — TYPE AND SCREEN
ABO/RH(D): A POS
Antibody Screen: NEGATIVE
Unit division: 0
Unit division: 0
Unit division: 0

## 2021-03-06 LAB — BPAM RBC
Blood Product Expiration Date: 202211212359
Blood Product Expiration Date: 202211272359
Blood Product Expiration Date: 202211272359
ISSUE DATE / TIME: 202211091545
ISSUE DATE / TIME: 202211091545
ISSUE DATE / TIME: 202211110747
Unit Type and Rh: 6200
Unit Type and Rh: 6200
Unit Type and Rh: 6200

## 2021-03-06 LAB — GLUCOSE, CAPILLARY
Glucose-Capillary: 127 mg/dL — ABNORMAL HIGH (ref 70–99)
Glucose-Capillary: 144 mg/dL — ABNORMAL HIGH (ref 70–99)
Glucose-Capillary: 143 mg/dL — ABNORMAL HIGH (ref 70–99)
Glucose-Capillary: 158 mg/dL — ABNORMAL HIGH (ref 70–99)
Glucose-Capillary: 119 mg/dL — ABNORMAL HIGH (ref 70–99)

## 2021-03-06 LAB — MAGNESIUM: Magnesium: 2.3 mg/dL (ref 1.7–2.4)

## 2021-03-06 LAB — BASIC METABOLIC PANEL
Anion gap: 6 (ref 5–15)
BUN: 21 mg/dL (ref 8–23)
CO2: 23 mmol/L (ref 22–32)
Calcium: 7.6 mg/dL — ABNORMAL LOW (ref 8.9–10.3)
Chloride: 102 mmol/L (ref 98–111)
Creatinine, Ser: 1.2 mg/dL (ref 0.61–1.24)
GFR, Estimated: >60 mL/min (ref >60)
Glucose, Bld: 129 mg/dL — ABNORMAL HIGH (ref 70–99)
Potassium: 4.2 mmol/L (ref 3.5–5.1)
Sodium: 131 mmol/L — ABNORMAL LOW (ref 135–145)

## 2021-03-06 LAB — CBC
HCT: 26 % — ABNORMAL LOW (ref 39.0–52.0)
Hemoglobin: 9.1 g/dL — ABNORMAL LOW (ref 13.0–17.0)
MCH: 31.3 pg (ref 26.0–34.0)
MCHC: 35 g/dL (ref 30.0–36.0)
MCV: 89.3 fL (ref 80.0–100.0)
Platelets: 75 10*3/uL — ABNORMAL LOW (ref 150–400)
RBC: 2.91 MIL/uL — ABNORMAL LOW (ref 4.22–5.81)
RDW: 14.8 % (ref 11.5–15.5)
WBC: 3.6 10*3/uL — ABNORMAL LOW (ref 4.0–10.5)
nRBC: 0 % (ref 0.0–0.2)

## 2021-03-06 MED ORDER — FUROSEMIDE 10 MG/ML IJ SOLN
40.0000 mg | Freq: Two times a day (BID) | INTRAMUSCULAR | Status: AC
  Administered 2021-03-06 – 2021-03-07 (×2): 40 mg via INTRAVENOUS
  Filled 2021-03-06 (×2): qty 4

## 2021-03-06 MED ORDER — FUROSEMIDE 10 MG/ML IJ SOLN: 40.0000 mg | Freq: Once | INTRAMUSCULAR | Status: DC

## 2021-03-06 MED ORDER — TEMAZEPAM 7.5 MG PO CAPS
7.5000 mg | ORAL_CAPSULE | Freq: Every evening | ORAL | Status: DC | PRN
  Administered 2021-03-06 – 2021-03-07 (×2): 7.5 mg via ORAL
  Filled 2021-03-06 (×2): qty 1

## 2021-03-06 MED ORDER — MELATONIN 3 MG PO TABS
3.0000 mg | ORAL_TABLET | Freq: Every evening | ORAL | Status: DC | PRN
  Administered 2021-03-06 – 2021-03-07 (×2): 3 mg via ORAL
  Filled 2021-03-06 (×2): qty 1

## 2021-03-06 MED ORDER — AMIODARONE LOAD VIA INFUSION
150.0000 mg | Freq: Once | INTRAVENOUS | Status: AC
  Administered 2021-03-06: 150 mg via INTRAVENOUS
  Filled 2021-03-06: qty 83.34

## 2021-03-06 MED ORDER — METOLAZONE 5 MG PO TABS
5.0000 mg | ORAL_TABLET | Freq: Once | ORAL | Status: AC
  Administered 2021-03-06: 5 mg via ORAL
  Filled 2021-03-06: qty 1

## 2021-03-06 MED ORDER — POTASSIUM CHLORIDE CRYS ER 20 MEQ PO TBCR
20.0000 meq | EXTENDED_RELEASE_TABLET | Freq: Two times a day (BID) | ORAL | Status: AC
  Administered 2021-03-06 (×2): 20 meq via ORAL
  Filled 2021-03-06 (×2): qty 1

## 2021-03-06 MED ORDER — TRAMADOL HCL 50 MG PO TABS: 50.0000 mg | ORAL_TABLET | ORAL | Status: DC | PRN

## 2021-03-06 NOTE — Progress Notes (Signed)
Patient ID: Edward [name], male   DOB: 08-13-1945, 75 y.o.   MRN: [id number]  T[facility] Evening Rounds:  Hemodynamically stable in sinus rhythm.   Making urine but foley is out so don't know how much.  CT's 200 cc today.

## 2021-03-06 NOTE — Progress Notes (Signed)
eLink Physician-Brief Progress Note Patient Name: Edward [name] DOB: 17-Dec-1945 MRN: [id number]   Date of Service  03/06/2021  HPI/Events of Note  Received request for sleep aid ie melatonin  eICU Interventions  Melatonin prn ordered but patient also has temazepam previously ordered as needed     Intervention Category Minor Interventions: Routine modifications to care plan (e.g. PRN medications for pain, fever)  Edward [name] Edward [name] 03/06/2021, 9:23 PM

## 2021-03-06 NOTE — Progress Notes (Signed)
NAME:  Edward [name], MRN:  [id number], DOB:  1946-03-18, LOS: 3 ADMISSION DATE:  03/03/2021, CONSULTATION DATE:  03/03/2021 REFERRING MD:  Cliffton Asters , CHIEF COMPLAINT:  post-op respiratory failure.   History of Present Illness:  75 year old man post AVR and CABGx 4  Presented in August with increasing chest pain radiating to right arm with improvement with antianginal therapy. He then underwent cardiac catheterization 10/20 which revealed severe three-vessel disease with at least moderate aortic stenosis with a mean gradient of 22 and calculated aortic valve area of 1.5 cm.  LV filling pressures were normal at that time. On 11/9 he underwent bioprosthetic aortic valve replacement, and CABG x4 with a LIMA to LAD and saphenous vein grafts to the PDA OM 2 and second diagonal.  Patient had high chest tube output in the operating room so was reopened and a sternal bleeding vessel was cauterized.  Pertinent  Medical History   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 by echo 07/2017   Chronic kidney disease    kidney stones   Colon polyps    Coronary artery disease 2022   Diplopia 10/15/2015   Diverticulosis    DVT (deep venous thrombosis) (HCC)    2018, 2019   Erectile dysfunction    GERD (gastroesophageal reflux disease)    with Barrett's esophagus   Gout    Hearing loss    hearing aides   Heart murmur    pt says he has had it since he was a child, no issues ever mentioned   HTN (hypertension)    Hypercholesteremia    Migraine headache    Myasthenia gravis (HCC)    Obesity    OSA (obstructive sleep apnea)    AHI 43/hr now on CPAP at 10cm H2O   Peripheral neuropathy 01/14/2019   Right foot drop 12/03/2018   Significant Hospital Events: Including procedures, antibiotic start and stop dates in addition to other pertinent events   11/9 Aortic valve replacement with 52mm  Inspiris Resilia valve CABG X 4.  LIMA LAD, RSVG PDA, OM2, D2 (y-graft off OM2 vein, Endoscopic greater saphenous vein harvest on the right and left thigh 11/9 required FFP and cryoprecipitate for bleeding overnight.  11/10 extubated.   Interim History / Subjective:   Trying to get moving, hopefully up walking today   Objective   Blood pressure 116/66, pulse 72, temperature 99.3 F (37.4 C), temperature source Oral, resp. rate 19, height 5\' 9"  (1.753 m), weight 103.2 kg, SpO2 99 %. 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 Postoperative coagulopathy Acute blood loss anemia - extubated 11/10 Post op respiratory failure Plan: - CT and pacing wires per CTS  -  Amio continued  - pain control  - up walking/OOB - mobility  - off pressors  - continue prednisone and mestinon    Best Practice (right click and "Reselect all SmartList Selections" daily)   Diet/type: Regular consistency (see orders) - advance diet per protocol  DVT prophylaxis: not indicated - chemical prophylaxis per TCTS GI prophylaxis: PPI Lines: Central line Foley:  Yes, and it is still needed Code Status:  full code Last date of multidisciplinary goals of care discussion updated at bedside  Labs   CBC: Recent Labs  Lab 03/04/21 0528 03/04/21 0848 03/04/21 1641 03/05/21 0419 03/05/21 1837 03/06/21 0325  WBC  9.8  --  11.8* 10.7* 9.1 3.6*  HGB 8.4* 7.5* 7.6* 7.0* 8.6* 9.1*  HCT 24.6* 22.0* 22.0* 20.8* 26.1* 26.0*  MCV 88.5  --  89.1 90.4 90.9 89.3  PLT 80*  --  69* 62* 68* 75*    Basic Metabolic Panel: Recent Labs  Lab 03/03/21 2302 03/04/21 0528 03/04/21 0848 03/04/21 1641 03/05/21 0419 03/05/21 1837 03/06/21 0325  NA 138 135 141 134* 135  --  131*  K 4.7 4.2 3.7 4.0 3.9  --  4.2  CL 109 106  --  106 105  --  102  CO2 23 23  --  22 24  --  23  GLUCOSE 176* 150*  --  122* 114*  --  129*  BUN 13 13  --  14 15  --  21  CREATININE 1.02 1.05  --  1.32* 1.23 1.43* 1.20  CALCIUM 7.5* 7.5*  --  7.4* 7.5*  --  7.6*  MG  --  2.7*  --  2.5*  --   --  2.3   GFR: Estimated Creatinine Clearance: 63.9 mL/min (by C-G formula based on SCr of 1.2 mg/dL). Recent Labs  Lab 03/04/21 1641 03/05/21 0419 03/05/21 1837 03/06/21 0325  WBC 11.8* 10.7* 9.1 3.6*    Liver Function Tests: Recent Labs  Lab 03/01/21 1548  AST 34  ALT 25  ALKPHOS 45  BILITOT 1.0  PROT 6.4*  ALBUMIN 3.5   No results for input(s): LIPASE, AMYLASE in the last 168 hours. No results for input(s): AMMONIA in the last 168 hours.  ABG    Component Value Date/Time   PHART 7.378 03/04/2021 0848   PCO2ART 36.6 03/04/2021 0848   PO2ART 107 03/04/2021 0848   HCO3 21.3 03/04/2021 0848   TCO2 22 03/04/2021 0848   ACIDBASEDEF 3.0 (H) 03/04/2021 0848   O2SAT 98.0 03/04/2021 0848     Coagulation Profile: Recent Labs  Lab 03/01/21 1548 03/03/21 1715 03/03/21 2302 03/04/21 0530  INR 1.1 1.8* 1.6* 1.4*    Cardiac Enzymes: No results for input(s): CKTOTAL, CKMB, CKMBINDEX, TROPONINI in the last 168 hours.  HbA1C: Hgb A1c MFr Bld  Date/Time Value Ref Range Status  03/01/2021 02:48 PM 6.2 (H) 4.8 - 5.6 % Final    Comment:    (NOTE) Pre diabetes:          5.7%-6.4%  Diabetes:              >6.4%  Glycemic control for   <7.0% adults with diabetes     CBG: Recent Labs  Lab 03/05/21 2017 03/05/21 2343  03/06/21 0405 03/06/21 0626 03/06/21 0757  GLUCAP 144* 130* 127* 144* 143*    Garner Nash, DO [facility] 03/06/2021 11:15 AM

## 2021-03-06 NOTE — Progress Notes (Signed)
3 Days Post-Op Procedure(s) (LRB): AORTIC VALVE REPLACEMENT (AVR) WITH BIOPROSTHETIC VALVE, Inpiris Resila Aortic Valve 56mm (N/A) CORONARY ARTERY BYPASS GRAFTING (CABG) x four on pump, using left internal mammary artery, left and right endoscopic greater saphenous veins conduits (N/A) TRANSESOPHAGEAL ECHOCARDIOGRAM (TEE) (N/A) ENDOVEIN HARVEST OF GREATER SAPHENOUS VEIN (Bilateral) APPLICATION OF CELL SAVER (N/A) MEDIASTINAL EXPLORATION (N/A) Subjective:  Feels ok. Having loose stools today. Ambulated.  Objective: Vital signs in last 24 hours: Temp:  [98.7 F (37.1 C)-99.3 F (37.4 C)] 99.3 F (37.4 C) (11/12 0758) Pulse Rate:  [43-95] 72 (11/12 1000) Cardiac Rhythm: Normal sinus rhythm (11/12 0800) Resp:  [15-27] 19 (11/12 1000) BP: (90-140)/(59-93) 116/66 (11/12 1000) SpO2:  [88 %-99 %] 99 % (11/12 1000) Arterial Line BP: (123-171)/(49-68) 171/64 (11/11 1700) Weight:  [103.2 kg] 103.2 kg (11/12 0500)  Hemodynamic parameters for last 24 hours: CVP:  [7 mmHg-40 mmHg] 10 mmHg  Intake/Output from previous day: 11/11 0701 - 11/12 0700 In: 1158.9 [I.V.:908.9; Blood:250] Out: 1440 [Urine:940; Chest Tube:500] Intake/Output this shift: Total I/O In: 146.7 [I.V.:146.7] Out: 70 [Chest Tube:70]  General appearance: alert and cooperative Neurologic: intact Heart: regular rate and rhythm, S1, S2 normal, no murmur Lungs: clear to auscultation bilaterally Extremities: edema moderate Wound: incision ok  Lab Results: Recent Labs    03/05/21 1837 03/06/21 0325  WBC 9.1 3.6*  HGB 8.6* 9.1*  HCT 26.1* 26.0*  PLT 68* 75*   BMET:  Recent Labs    03/05/21 0419 03/05/21 1837 03/06/21 0325  NA 135  --  131*  K 3.9  --  4.2  CL 105  --  102  CO2 24  --  23  GLUCOSE 114*  --  129*  BUN 15  --  21  CREATININE 1.23 1.43* 1.20  CALCIUM 7.5*  --  7.6*    PT/INR:  Recent Labs    03/04/21 0530  LABPROT 17.5*  INR 1.4*   ABG    Component Value Date/Time   PHART 7.378  03/04/2021 0848   HCO3 21.3 03/04/2021 0848   TCO2 22 03/04/2021 0848   ACIDBASEDEF 3.0 (H) 03/04/2021 0848   O2SAT 98.0 03/04/2021 0848   CBG (last 3)  Recent Labs    03/06/21 0405 03/06/21 0626 03/06/21 0757  GLUCAP 127* 144* 143*   CXR: mild bibasilar atelectasis  Assessment/Plan: S/P Procedure(s) (LRB): AORTIC VALVE REPLACEMENT (AVR) WITH BIOPROSTHETIC VALVE, Inpiris Resila Aortic Valve 74mm (N/A) CORONARY ARTERY BYPASS GRAFTING (CABG) x four on pump, using left internal mammary artery, left and right endoscopic greater saphenous veins conduits (N/A) TRANSESOPHAGEAL ECHOCARDIOGRAM (TEE) (N/A) ENDOVEIN HARVEST OF GREATER SAPHENOUS VEIN (Bilateral) APPLICATION OF CELL SAVER (N/A) MEDIASTINAL EXPLORATION (N/A)  POD 3  Hemodynamically stable in sinus rhythm. Postop atrial fibrillation. Continue Lopressor and IV amiodarone.  Volume excess: wt is 23 lbs over preop. Start diuresis.  Chest tubes still draining. Keep in  IS, ambulation.   LOS: 3 days    Alleen Borne 03/06/2021

## 2021-03-07 ENCOUNTER — Inpatient Hospital Stay (HOSPITAL_COMMUNITY): Payer: Medicare PPO

## 2021-03-07 DIAGNOSIS — I2510 Atherosclerotic heart disease of native coronary artery without angina pectoris: Principal | ICD-10-CM

## 2021-03-07 LAB — CBC
HCT: 26 % — ABNORMAL LOW (ref 39.0–52.0)
Hemoglobin: 8.9 g/dL — ABNORMAL LOW (ref 13.0–17.0)
MCH: 30.6 pg (ref 26.0–34.0)
MCHC: 34.2 g/dL (ref 30.0–36.0)
MCV: 89.3 fL (ref 80.0–100.0)
Platelets: 128 10*3/uL — ABNORMAL LOW (ref 150–400)
RBC: 2.91 MIL/uL — ABNORMAL LOW (ref 4.22–5.81)
RDW: 15.1 % (ref 11.5–15.5)
WBC: 4.7 10*3/uL (ref 4.0–10.5)
nRBC: 1.3 % — ABNORMAL HIGH (ref 0.0–0.2)

## 2021-03-07 LAB — BASIC METABOLIC PANEL
Anion gap: 9 (ref 5–15)
BUN: 20 mg/dL (ref 8–23)
CO2: 25 mmol/L (ref 22–32)
Calcium: 7.4 mg/dL — ABNORMAL LOW (ref 8.9–10.3)
Chloride: 97 mmol/L — ABNORMAL LOW (ref 98–111)
Creatinine, Ser: 1.17 mg/dL (ref 0.61–1.24)
GFR, Estimated: >60 mL/min (ref >60)
Glucose, Bld: 155 mg/dL — ABNORMAL HIGH (ref 70–99)
Potassium: 3.7 mmol/L (ref 3.5–5.1)
Sodium: 131 mmol/L — ABNORMAL LOW (ref 135–145)

## 2021-03-07 LAB — GLUCOSE, CAPILLARY
Glucose-Capillary: 110 mg/dL — ABNORMAL HIGH (ref 70–99)
Glucose-Capillary: 162 mg/dL — ABNORMAL HIGH (ref 70–99)
Glucose-Capillary: 118 mg/dL — ABNORMAL HIGH (ref 70–99)

## 2021-03-07 LAB — ECHO INTRAOPERATIVE TEE
AR max vel: 2.19 cm2
AV Area VTI: 2.12 cm2
AV Area mean vel: 1.99 cm2
AV Mean grad: 14.5 mmHg
AV Peak grad: 21.7 mmHg
Ao pk vel: 2.33 m/s
Height: 69 in
P 1/2 time: 776 msec
Weight: 3264 oz

## 2021-03-07 MED ORDER — METOLAZONE 5 MG PO TABS
5.0000 mg | ORAL_TABLET | Freq: Once | ORAL | Status: AC
  Administered 2021-03-07: 5 mg via ORAL
  Filled 2021-03-07: qty 1

## 2021-03-07 MED ORDER — POTASSIUM CHLORIDE CRYS ER 20 MEQ PO TBCR
20.0000 meq | EXTENDED_RELEASE_TABLET | ORAL | Status: AC
  Administered 2021-03-07 (×3): 20 meq via ORAL
  Filled 2021-03-07 (×3): qty 1

## 2021-03-07 MED ORDER — AMIODARONE HCL 200 MG PO TABS
200.0000 mg | ORAL_TABLET | Freq: Two times a day (BID) | ORAL | Status: DC
  Administered 2021-03-07 – 2021-03-10 (×8): 200 mg via ORAL
  Filled 2021-03-07 (×9): qty 1

## 2021-03-07 MED ORDER — METOPROLOL TARTRATE 25 MG/10 ML ORAL SUSPENSION: 25.0000 mg | Freq: Two times a day (BID) | ORAL | Status: DC

## 2021-03-07 MED ORDER — FUROSEMIDE 10 MG/ML IJ SOLN
40.0000 mg | Freq: Two times a day (BID) | INTRAMUSCULAR | Status: DC
  Administered 2021-03-07: 40 mg via INTRAVENOUS
  Filled 2021-03-07: qty 4

## 2021-03-07 MED ORDER — METOPROLOL TARTRATE 25 MG PO TABS
25.0000 mg | ORAL_TABLET | Freq: Two times a day (BID) | ORAL | Status: DC
  Administered 2021-03-07: 25 mg via ORAL
  Filled 2021-03-07: qty 1

## 2021-03-07 NOTE — Progress Notes (Signed)
Case discussed with nurse, labs/notes/vitals/imaging reviewed.  Will recheck a CBC and see what is going on with the white count drip, maybe yesterday was erroneous.  Will see again formally tomorrow, RN instructed to reach out if any issues today.  Myrla Halsted MD PCCM

## 2021-03-07 NOTE — Progress Notes (Signed)
Patient ID: Edward [name], male   DOB: [birth year], 75 y.o.   MRN: [id number] TCTS Evening Rounds:  Hemodynamically stable in sinus rhythm  Sats 95% RA  Diuresing well. -1L today.  Had BM

## 2021-03-07 NOTE — Progress Notes (Signed)
4 Days Post-Op Procedure(s) (LRB): AORTIC VALVE REPLACEMENT (AVR) WITH BIOPROSTHETIC VALVE, Inpiris Resila Aortic Valve 13mm (N/A) CORONARY ARTERY BYPASS GRAFTING (CABG) x four on pump, using left internal mammary artery, left and right endoscopic greater saphenous veins conduits (N/A) TRANSESOPHAGEAL ECHOCARDIOGRAM (TEE) (N/A) ENDOVEIN HARVEST OF GREATER SAPHENOUS VEIN (Bilateral) APPLICATION OF CELL SAVER (N/A) MEDIASTINAL EXPLORATION (N/A) Subjective: No complaints. Pain under control. Breathing ok  Objective: Vital signs in last 24 hours: Temp:  [97.1 F (36.2 C)-99.1 F (37.3 C)] 98.7 F (37.1 C) (11/13 0755) Pulse Rate:  [51-111] 78 (11/13 1000) Cardiac Rhythm: Normal sinus rhythm;Atrial fibrillation (11/13 0800) Resp:  [16-27] 21 (11/13 1000) BP: (111-157)/(65-127) 125/69 (11/13 1000) SpO2:  [92 %-99 %] 99 % (11/13 1000) Weight:  [103.4 kg] 103.4 kg (11/13 0100)  Hemodynamic parameters for last 24 hours: CVP:  [5 mmHg-18 mmHg] 6 mmHg  Intake/Output from previous day: 11/12 0701 - 11/13 0700 In: 870.3 [I.V.:870.3] Out: 1690 [Urine:1450; Chest Tube:240] Intake/Output this shift: Total I/O In: 217.3 [I.V.:217.3] Out: 670 [Urine:600; Chest Tube:70]  General appearance: alert and cooperative Neurologic: intact Heart: regular rate and rhythm, S1, S2 normal, no murmur Lungs: clear to auscultation bilaterally Extremities: edema moderate Wound: incisions ok  Lab Results: Recent Labs    03/05/21 1837 03/06/21 0325  WBC 9.1 3.6*  HGB 8.6* 9.1*  HCT 26.1* 26.0*  PLT 68* 75*   BMET:  Recent Labs    03/06/21 0325 03/07/21 0236  NA 131* 131*  K 4.2 3.7  CL 102 97*  CO2 23 25  GLUCOSE 129* 155*  BUN 21 20  CREATININE 1.20 1.17  CALCIUM 7.6* 7.4*    PT/INR: No results for input(s): LABPROT, INR in the last 72 hours. ABG    Component Value Date/Time   PHART 7.378 03/04/2021 0848   HCO3 21.3 03/04/2021 0848   TCO2 22 03/04/2021 0848   ACIDBASEDEF 3.0 (H)  03/04/2021 0848   O2SAT 98.0 03/04/2021 0848   CBG (last 3)  Recent Labs    03/06/21 1602 03/07/21 0403 03/07/21 0750  GLUCAP 119* 110* 162*   CXR ok  Assessment/Plan: S/P Procedure(s) (LRB): AORTIC VALVE REPLACEMENT (AVR) WITH BIOPROSTHETIC VALVE, Inpiris Resila Aortic Valve 38mm (N/A) CORONARY ARTERY BYPASS GRAFTING (CABG) x four on pump, using left internal mammary artery, left and right endoscopic greater saphenous veins conduits (N/A) TRANSESOPHAGEAL ECHOCARDIOGRAM (TEE) (N/A) ENDOVEIN HARVEST OF GREATER SAPHENOUS VEIN (Bilateral) APPLICATION OF CELL SAVER (N/A) MEDIASTINAL EXPLORATION (N/A)  POD 4  Hemodynamically stable in sinus rhythm on IV amio. Will increase Lopressor to 25 bid and switch amio to po.  Volume excess: wt is still 24 lbs over preop. -820 cc yesterday with diuretic. Will continue lasix and metolazone today. Replace K+  DC chest tubes  IS, mobilize.  Glucose under good control.   LOS: 4 days    Alleen Borne 03/07/2021

## 2021-03-08 ENCOUNTER — Inpatient Hospital Stay (HOSPITAL_COMMUNITY): Payer: Medicare PPO

## 2021-03-08 DIAGNOSIS — D696 Thrombocytopenia, unspecified: Secondary | ICD-10-CM

## 2021-03-08 DIAGNOSIS — Z951 Presence of aortocoronary bypass graft: Secondary | ICD-10-CM | POA: Diagnosis not present

## 2021-03-08 DIAGNOSIS — R739 Hyperglycemia, unspecified: Secondary | ICD-10-CM

## 2021-03-08 DIAGNOSIS — Z86718 Personal history of other venous thrombosis and embolism: Secondary | ICD-10-CM | POA: Diagnosis not present

## 2021-03-08 LAB — BASIC METABOLIC PANEL
Anion gap: 7 (ref 5–15)
BUN: 19 mg/dL (ref 8–23)
CO2: 28 mmol/L (ref 22–32)
Calcium: 8.3 mg/dL — ABNORMAL LOW (ref 8.9–10.3)
Chloride: 100 mmol/L (ref 98–111)
Creatinine, Ser: 1.24 mg/dL (ref 0.61–1.24)
GFR, Estimated: >60 mL/min (ref >60)
Glucose, Bld: 105 mg/dL — ABNORMAL HIGH (ref 70–99)
Potassium: 3.8 mmol/L (ref 3.5–5.1)
Sodium: 135 mmol/L (ref 135–145)

## 2021-03-08 LAB — CBC
HCT: 26.4 % — ABNORMAL LOW (ref 39.0–52.0)
Hemoglobin: 9 g/dL — ABNORMAL LOW (ref 13.0–17.0)
MCH: 30.7 pg (ref 26.0–34.0)
MCHC: 34.1 g/dL (ref 30.0–36.0)
MCV: 90.1 fL (ref 80.0–100.0)
Platelets: 145 10*3/uL — ABNORMAL LOW (ref 150–400)
RBC: 2.93 MIL/uL — ABNORMAL LOW (ref 4.22–5.81)
RDW: 14.9 % (ref 11.5–15.5)
WBC: 5.9 10*3/uL (ref 4.0–10.5)
nRBC: 0 % (ref 0.0–0.2)

## 2021-03-08 LAB — GLUCOSE, CAPILLARY
Glucose-Capillary: 118 mg/dL — ABNORMAL HIGH (ref 70–99)
Glucose-Capillary: 105 mg/dL — ABNORMAL HIGH (ref 70–99)
Glucose-Capillary: 103 mg/dL — ABNORMAL HIGH (ref 70–99)
Glucose-Capillary: 127 mg/dL — ABNORMAL HIGH (ref 70–99)
Glucose-Capillary: 120 mg/dL — ABNORMAL HIGH (ref 70–99)
Glucose-Capillary: 134 mg/dL — ABNORMAL HIGH (ref 70–99)
Glucose-Capillary: 159 mg/dL — ABNORMAL HIGH (ref 70–99)
Glucose-Capillary: 163 mg/dL — ABNORMAL HIGH (ref 70–99)
Glucose-Capillary: 113 mg/dL — ABNORMAL HIGH (ref 70–99)

## 2021-03-08 LAB — MAGNESIUM: Magnesium: 1.9 mg/dL (ref 1.7–2.4)

## 2021-03-08 LAB — PHOSPHORUS: Phosphorus: 1.8 mg/dL — ABNORMAL LOW (ref 2.5–4.6)

## 2021-03-08 MED ORDER — POTASSIUM CHLORIDE CRYS ER 20 MEQ PO TBCR
40.0000 meq | EXTENDED_RELEASE_TABLET | Freq: Every day | ORAL | Status: DC
  Administered 2021-03-08 – 2021-03-09 (×2): 40 meq via ORAL
  Filled 2021-03-08 (×2): qty 2

## 2021-03-08 MED ORDER — BISACODYL 10 MG RE SUPP: 10.0000 mg | Freq: Once | RECTAL | Status: DC

## 2021-03-08 MED ORDER — POTASSIUM PHOSPHATES 15 MMOLE/5ML IV SOLN
30.0000 mmol | Freq: Once | INTRAVENOUS | Status: AC
  Administered 2021-03-08: 30 mmol via INTRAVENOUS
  Filled 2021-03-08: qty 10

## 2021-03-08 MED ORDER — MAGNESIUM SULFATE 2 GM/50ML IV SOLN
2.0000 g | Freq: Once | INTRAVENOUS | Status: AC
  Administered 2021-03-08: 2 g via INTRAVENOUS
  Filled 2021-03-08: qty 50

## 2021-03-08 MED ORDER — FUROSEMIDE 40 MG PO TABS
40.0000 mg | ORAL_TABLET | Freq: Every day | ORAL | Status: DC
  Administered 2021-03-08: 40 mg via ORAL
  Filled 2021-03-08: qty 1

## 2021-03-08 MED ORDER — RIVAROXABAN 20 MG PO TABS
20.0000 mg | ORAL_TABLET | Freq: Every day | ORAL | Status: DC
  Administered 2021-03-08 – 2021-03-11 (×4): 20 mg via ORAL
  Filled 2021-03-08 (×4): qty 1

## 2021-03-08 MED ORDER — ~~LOC~~ CARDIAC SURGERY, PATIENT & FAMILY EDUCATION: Freq: Once | Status: DC

## 2021-03-08 MED ORDER — SODIUM CHLORIDE 0.9 % IV SOLN: 250.0000 mL | INTRAVENOUS | Status: DC | PRN

## 2021-03-08 MED ORDER — SODIUM CHLORIDE 0.9% FLUSH: 3.0000 mL | INTRAVENOUS | Status: DC | PRN

## 2021-03-08 MED ORDER — SODIUM CHLORIDE 0.9% FLUSH
3.0000 mL | Freq: Two times a day (BID) | INTRAVENOUS | Status: DC
  Administered 2021-03-08: 3 mL via INTRAVENOUS

## 2021-03-08 MED ORDER — METOPROLOL TARTRATE 25 MG PO TABS
37.5000 mg | ORAL_TABLET | Freq: Two times a day (BID) | ORAL | Status: DC
  Administered 2021-03-08 (×2): 37.5 mg via ORAL
  Filled 2021-03-08 (×2): qty 1

## 2021-03-08 NOTE — Progress Notes (Addendum)
Pt ambulated using the front wheel walker  419ft taken without breaks  Pt tolerated well  Pt back in bed with call bell within reach

## 2021-03-08 NOTE — Progress Notes (Signed)
      [address]       [place] [address]             [phone]                 5 Days Post-Op Procedure(s) (LRB): AORTIC VALVE REPLACEMENT (AVR) WITH BIOPROSTHETIC VALVE, Inpiris Resila Aortic Valve 62mm (N/A) CORONARY ARTERY BYPASS GRAFTING (CABG) x four on pump, using left internal mammary artery, left and right endoscopic greater saphenous veins conduits (N/A) TRANSESOPHAGEAL ECHOCARDIOGRAM (TEE) (N/A) ENDOVEIN HARVEST OF GREATER SAPHENOUS VEIN (Bilateral) APPLICATION OF CELL SAVER (N/A) MEDIASTINAL EXPLORATION (N/A)   Events: No events _______________________________________________________________ Vitals: BP (!) 137/120   Pulse 85   Temp 98.4 F (36.9 C) (Oral)   Resp (!) 25   Ht 5\' 9"  (1.753 m)   Wt 101.2 kg   SpO2 98%   BMI 32.95 kg/m  Filed Weights   03/06/21 0500 03/07/21 0100 03/08/21 0500  Weight: 103.2 kg 103.4 kg 101.2 kg     - Neuro: alert NAD  - Cardiovascular: sinus  Drips: none.   CVP:  [5 mmHg-18 mmHg] 12 mmHg  - Pulm: EWOB    ABG    Component Value Date/Time   PHART 7.378 03/04/2021 0848   PCO2ART 36.6 03/04/2021 0848   PO2ART 107 03/04/2021 0848   HCO3 21.3 03/04/2021 0848   TCO2 22 03/04/2021 0848   ACIDBASEDEF 3.0 (H) 03/04/2021 0848   O2SAT 98.0 03/04/2021 0848    - Abd: ND - Extremity: warm  .Intake/Output      11/13 0701 11/14 0700 11/14 0701 11/15 0700   P.O. 120    I.V. (mL/kg) 573.4 (5.7)    Total Intake(mL/kg) 693.4 (6.9)    Urine (mL/kg/hr) 4190 (1.7)    Stool 0    Chest Tube 70    Total Output 4260    Net -3566.6         Urine Occurrence 2 x    Stool Occurrence 4 x       _______________________________________________________________ Labs: CBC Latest Ref Rng & Units 03/08/2021 03/07/2021 03/06/2021  WBC 4.0 - 10.5 K/uL 5.9 4.7 3.6(L)  Hemoglobin 13.0 - 17.0 g/dL 9.0(L) 8.9(L) 9.1(L)  Hematocrit 39.0 - 52.0 % 26.4(L) 26.0(L) 26.0(L)  Platelets 150 - 400 K/uL 145(L) 128(L) 75(L)    CMP Latest Ref Rng & Units 03/08/2021 03/07/2021 03/06/2021  Glucose 70 - 99 mg/dL 13/03/2021) 026(V) 785(Y)  BUN 8 - 23 mg/dL 19 20 21   Creatinine 0.61 - 1.24 mg/dL 850(Y 7.74  Sodium 135 - 145 mmol/L 135 131(L) 131(L)  Potassium 3.5 - 5.1 mmol/L 3.8 3.7 4.2  Chloride 98 - 111 mmol/L 100 97(L) 102  CO2 22 - 32 mmol/L 28 25 23   Calcium 8.9 - 10.3 mg/dL 8.3(L) 7.4(L) 7.6(L)  Total Protein 6.5 - 8.1 g/dL - - -  Total Bilirubin 0.3 - 1.2 mg/dL - - -  Alkaline Phos 38 - 126 U/L - - -  AST 15 - 41 U/L - - -  ALT 0 - 44 U/L - - -    CXR: -  _______________________________________________________________  Assessment and Plan: POD 5 s/p AVR CABG  Neuro: pain controlled CV: sinus.  Increasing BB.  Will remove epicardial wires. Pulm: continue pulm hygiene. Renal: creat stable.  Continue diuresis GI: on diet Heme: stable ID: [email or web address] Endo: SSI.  On chronic steroid for myasthenia Dispo: floor   1.28 03/08/2021 7:48 AM

## 2021-03-08 NOTE — Progress Notes (Signed)
Pacer wires removed per order. VSS. Wires intact, no site complications. Dressing applied. Pt informed to remain in bed until up time at 9 AM and to call for any change in status. BP q15 initiated. No other needs at this time.

## 2021-03-08 NOTE — Progress Notes (Signed)
ANTICOAGULATION CONSULT NOTE  Pharmacy Consult for rivaroxaban Indication: atrial fibrillation and DVT  No Known Allergies  Patient Measurements: Height: 5\' 9"  (175.3 cm) Weight: 101.2 kg (223 lb 1.7 oz) IBW/kg (Calculated) : 70.7  Vital Signs: Temp: 98.6 F (37 C) (11/14 1123) Temp Source: Oral (11/14 1123) BP: 133/68 (11/14 0845) Pulse Rate: 73 (11/14 1300)  Labs: Recent Labs    03/06/21 0325 03/07/21 0236 03/07/21 1200 03/08/21 0302  HGB 9.1*  --  8.9* 9.0*  HCT 26.0*  --  26.0* 26.4*  PLT 75*  --  128* 145*  CREATININE 1.20 1.17  --  1.24    Estimated Creatinine Clearance: 61.3 mL/min (by C-G formula based on SCr of 1.24 mg/dL).   Medical History: 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 by echo 07/2017   Chronic kidney disease    kidney stones   Colon polyps    Coronary artery disease 2022   Diplopia 10/15/2015   Diverticulosis    DVT (deep venous thrombosis) (HCC)    2018, 2019   Erectile dysfunction    GERD (gastroesophageal reflux disease)    with Barrett's esophagus   Gout    Hearing loss    hearing aides   Heart murmur    pt says he has had it since he was a child, no issues ever mentioned   HTN (hypertension)    Hypercholesteremia    Migraine headache    Myasthenia gravis (HCC)    Obesity    OSA (obstructive sleep apnea)    AHI 43/hr now on CPAP at 10cm H2O   Peripheral neuropathy 01/14/2019   Right foot drop 12/03/2018   Assessment: 75 yoM s/p CABG and bAVR on 11/9. Pt on Xarelto PTA for hx DVT and AFib. Pharmacy asked to resume Xarelto. CBC and Cr are stable postop.  Goal of Therapy:  Monitor platelets by anticoagulation protocol: Yes   Plan:  Resume Xarelto 20mg  qsupper  13/9, PharmD, BCPS, [facility] Clinical Pharmacist Please check AMION for all [facility] Pharmacy numbers 03/08/2021

## 2021-03-08 NOTE — Progress Notes (Addendum)
NAME:  Edward [name], MRN:  [id number], DOB:  [birth year], LOS: 5 ADMISSION DATE:  03/03/2021, CONSULTATION DATE:  03/03/2021 REFERRING MD:  Cliffton Asters , CHIEF COMPLAINT:  post-op respiratory failure.   History of Present Illness:  75 year old man post AVR and CABGx 4  Presented in August with increasing chest pain radiating to right arm with improvement with antianginal therapy. He then underwent cardiac catheterization 10/20 which revealed severe three-vessel disease with at least moderate aortic stenosis with a mean gradient of 22 and calculated aortic valve area of 1.5 cm.  LV filling pressures were normal at that time. On 11/9 he underwent bioprosthetic aortic valve replacement, and CABG x4 with a LIMA to LAD and saphenous vein grafts to the PDA OM 2 and second diagonal.  Patient had high chest tube output in the operating room so was reopened and a sternal bleeding vessel was cauterized.  Pertinent  Medical History   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 by echo 07/2017   Chronic kidney disease    kidney stones   Colon polyps    Coronary artery disease 2022   Diplopia 10/15/2015   Diverticulosis    DVT (deep venous thrombosis) (HCC)    2018, 2019   Erectile dysfunction    GERD (gastroesophageal reflux disease)    with Barrett's esophagus   Gout    Hearing loss    hearing aides   Heart murmur    pt says he has had it since he was a child, no issues ever mentioned   HTN (hypertension)    Hypercholesteremia    Migraine headache    Myasthenia gravis (HCC)    Obesity    OSA (obstructive sleep apnea)    AHI 43/hr now on CPAP at 10cm H2O   Peripheral neuropathy 01/14/2019   Right foot drop 12/03/2018   Significant Hospital Events: Including procedures, antibiotic start and stop dates in addition to other pertinent events   11/9 Aortic valve replacement with 52mm  Inspiris Resilia valve CABG X 4.  LIMA LAD, RSVG PDA, OM2, D2 (y-graft off OM2 vein, Endoscopic greater saphenous vein harvest on the right and left thigh 11/9 required FFP and cryoprecipitate for bleeding overnight.  11/10 extubated.  11/12 started Diuresis for 23 lb volume excess 11/13 still NSR so amiodarone changed to oral. Metolazone added. Chest tubes removed. CBC repeated for leukocytosis. Starting [facility] Interim History / Subjective:  Feeling better   Objective   Blood pressure 133/68, pulse 80, temperature 98.4 F (36.9 C), temperature source Oral, resp. rate 18, height 5\' 9"  (1.753 m), weight 101.2 kg, SpO2 96 %. CVP:  [5 mmHg-18 mmHg] 9 mmHg      Intake/Output Summary (Last 24 hours) at 03/08/2021 0900 Last data filed at 03/08/2021 0600 Gross per 24 hour  Intake 512.71 ml  Output 4190 ml  Net -3677.29 ml   Filed Weights   03/06/21 0500 03/07/21 0100 03/08/21 0500  Weight: 103.2 kg 103.4 kg 101.2 kg    Examination: General this is a pleasant 75 year old male he is resting in bed and in No distress HENT NCAT no JVD MMM right IJ CVL site dressing intact Card irreg irreg w/ AF on tele  Pulm Clear. Dec bases. Now on room air Ext left LE ecchymosis trace LE edema  Abd soft not tender Neuro awake and oriented.  Resolved problem list   Post op respiratory failure Post op coagulopathy  Assessment & Plan:  Coronary artery disease & Moderate bicuspid aortic stenosis  S/p CABGx4 and  bioprosthetic valve Plan Cont mobilization  Transfer to tele Epicardial wires ordered to be removed.   Post op afib (has been in and out of NSR) was NSR on CVTS rounds now back in AF Plan BB increased by CVTS Cont daily amiodarone Cont tele  Will start [facility] (also has h/o recurrent DVT)  K goal > 4/Mg > 2  Hypophosphatemia Plan Replace   Acute blood loss anemia Plan  Trend cbc   Ocular Myasthenia  Plan Cont pred and mestinon   Hyperglycemia exacerbated by steroids Plan Ssi      Best Practice (right click and "Reselect all SmartList Selections" daily)   Diet/type: Regular consistency (see orders)  DVT prophylaxis: not indicated - chemical prophylaxis per TCTS W/ plan to start systemic GI prophylaxis: PPI Lines: N/A Foley:  N/A Code Status:  full code Last date of multidisciplinary goals of care discussion updated at bedside  Erick Colace ACNP-BC [place] Pager # [phone] OR # [phone] if no answer

## 2021-03-09 LAB — BASIC METABOLIC PANEL
Anion gap: 9 (ref 5–15)
BUN: 30 mg/dL — ABNORMAL HIGH (ref 8–23)
CO2: 27 mmol/L (ref 22–32)
Calcium: 8.6 mg/dL — ABNORMAL LOW (ref 8.9–10.3)
Chloride: 98 mmol/L (ref 98–111)
Creatinine, Ser: 1.42 mg/dL — ABNORMAL HIGH (ref 0.61–1.24)
GFR, Estimated: 52 mL/min — ABNORMAL LOW (ref >60)
Glucose, Bld: 109 mg/dL — ABNORMAL HIGH (ref 70–99)
Potassium: 4.2 mmol/L (ref 3.5–5.1)
Sodium: 134 mmol/L — ABNORMAL LOW (ref 135–145)

## 2021-03-09 LAB — MAGNESIUM: Magnesium: 2.1 mg/dL (ref 1.7–2.4)

## 2021-03-09 LAB — CBC
HCT: 27.7 % — ABNORMAL LOW (ref 39.0–52.0)
Hemoglobin: 9.2 g/dL — ABNORMAL LOW (ref 13.0–17.0)
MCH: 30.2 pg (ref 26.0–34.0)
MCHC: 33.2 g/dL (ref 30.0–36.0)
MCV: 90.8 fL (ref 80.0–100.0)
Platelets: 195 10*3/uL (ref 150–400)
RBC: 3.05 MIL/uL — ABNORMAL LOW (ref 4.22–5.81)
RDW: 14.8 % (ref 11.5–15.5)
WBC: 9.6 10*3/uL (ref 4.0–10.5)
nRBC: 0.2 % (ref 0.0–0.2)

## 2021-03-09 LAB — GLUCOSE, CAPILLARY
Glucose-Capillary: 116 mg/dL — ABNORMAL HIGH (ref 70–99)
Glucose-Capillary: 112 mg/dL — ABNORMAL HIGH (ref 70–99)
Glucose-Capillary: 104 mg/dL — ABNORMAL HIGH (ref 70–99)
Glucose-Capillary: 111 mg/dL — ABNORMAL HIGH (ref 70–99)

## 2021-03-09 LAB — PHOSPHORUS: Phosphorus: 4 mg/dL (ref 2.5–4.6)

## 2021-03-09 MED ORDER — AMIODARONE IV BOLUS ONLY 150 MG/100ML
150.0000 mg | Freq: Once | INTRAVENOUS | Status: AC
  Administered 2021-03-09: 150 mg via INTRAVENOUS
  Filled 2021-03-09: qty 100

## 2021-03-09 MED ORDER — METOPROLOL TARTRATE 50 MG PO TABS
50.0000 mg | ORAL_TABLET | Freq: Two times a day (BID) | ORAL | Status: DC
  Administered 2021-03-09 – 2021-03-10 (×4): 50 mg via ORAL
  Filled 2021-03-09 (×4): qty 1

## 2021-03-09 NOTE — Progress Notes (Signed)
Received call from CCMD nofified pt'sHR changed to Afib. EKG obtained and confirmed afib. 3mg  metoprol iv given. PA aware and put new orders (see mar). Pt asymptomatic. Will continue to monitor the pt.   , RN

## 2021-03-09 NOTE — Progress Notes (Signed)
[address]       [place] [id number]             [phone]      6 Days Post-Op Procedure(s) (LRB): AORTIC VALVE REPLACEMENT (AVR) WITH BIOPROSTHETIC VALVE, Inpiris Resila Aortic Valve 63mm (N/A) CORONARY ARTERY BYPASS GRAFTING (CABG) x four on pump, using left internal mammary artery, left and right endoscopic greater saphenous veins conduits (N/A) TRANSESOPHAGEAL ECHOCARDIOGRAM (TEE) (N/A) ENDOVEIN HARVEST OF GREATER SAPHENOUS VEIN (Bilateral) APPLICATION OF CELL SAVER (N/A) MEDIASTINAL EXPLORATION (N/A) Subjective: Feels pretty well overall, ambulation improving  Objective: Vital signs in last 24 hours: Temp:  [98.3 F (36.8 C)-99.3 F (37.4 C)] 98.3 F (36.8 C) (11/15 0450) Pulse Rate:  [72-87] 82 (11/15 0450) Cardiac Rhythm: Normal sinus rhythm (11/14 2005) Resp:  [16-29] 20 (11/15 0450) BP: (98-149)/(58-102) 127/58 (11/15 0450) SpO2:  [92 %-99 %] 96 % (11/15 0450) Weight:  [96.1 kg] 96.1 kg (11/15 0006)  Hemodynamic parameters for last 24 hours:    Intake/Output from previous day: 11/14 0701 - 11/15 0700 In: 120 [P.O.:120] Out: 725 [Urine:725] Intake/Output this shift: No intake/output data recorded.  General appearance: alert, cooperative, and no distress Heart: regular rate and rhythm and extrasystoles Lungs: clear Abdomen: benign Extremities: no edema Wound: incis healing well, mod echymosis in thighs  Lab Results: Recent Labs    03/08/21 0302 03/09/21 0140  WBC 5.9 9.6  HGB 9.0* 9.2*  HCT 26.4* 27.7*  PLT 145* 195   BMET:  Recent Labs    03/08/21 0302 03/09/21 0140  NA 135 134*  K 3.8 4.2  CL 100 98  CO2 28 27  GLUCOSE 105* 109*  BUN 19 30*  CREATININE 1.24 1.42*  CALCIUM 8.3* 8.6*    PT/INR: No results for input(s): LABPROT, INR in the last 72 hours. ABG    Component Value Date/Time   PHART 7.378 03/04/2021 0848   HCO3 21.3 03/04/2021 0848   TCO2 22 03/04/2021 0848   ACIDBASEDEF 3.0 (H) 03/04/2021 0848    O2SAT 98.0 03/04/2021 0848   CBG (last 3)  Recent Labs    03/08/21 1601 03/08/21 2046 03/09/21 0522  GLUCAP 134* 113* 116*    Meds Scheduled Meds:  sodium chloride   Intravenous Once   sodium chloride   Intravenous Once   amiodarone  200 mg Oral BID   aspirin EC  325 mg Oral Daily   Or   aspirin  324 mg Per Tube Daily   bisacodyl  10 mg Oral Daily   Or   bisacodyl  10 mg Rectal Daily   Chlorhexidine Gluconate Cloth  6 each Topical Daily   Chlorhexidine Gluconate Cloth  6 each Topical Daily   [place] [facility], Patient & Family Education   Does not apply Once   docusate  200 mg Oral Daily   furosemide  40 mg Oral Daily   insulin aspart  0-15 Units Subcutaneous TID WC   insulin detemir  5 Units Subcutaneous BID   mouth rinse  15 mL Mouth Rinse BID   metoprolol tartrate  37.5 mg Oral BID   pantoprazole sodium  40 mg Oral Daily   potassium chloride  40 mEq Oral Daily   predniSONE  6 mg Oral Daily   pyridostigmine  30 mg Oral Q8H   rivaroxaban  20 mg Oral Q supper   sodium chloride flush  10-40 mL Intracatheter Q12H   sodium chloride flush  3 mL Intravenous Q12H  sodium chloride flush  3 mL Intravenous Q12H   Continuous Infusions:  sodium chloride     sodium chloride Stopped (03/04/21 0855)   sodium chloride     sodium chloride     lactated ringers 20 mL/hr at 03/03/21 1855   lactated ringers Stopped (03/08/21 0800)   PRN Meds:.sodium chloride, sodium chloride, fentaNYL (SUBLIMAZE) injection, melatonin, metoprolol tartrate, ondansetron (ZOFRAN) IV, oxyCODONE, sodium chloride flush, sodium chloride flush, sodium chloride flush, temazepam, traMADol  Xrays DG CHEST PORT 1 VIEW  Result Date: 03/08/2021 CLINICAL DATA:  Shortness of breath.  Recent CABG. EXAM: PORTABLE CHEST 1 VIEW COMPARISON:  03/07/2021 FINDINGS: Right IJ catheter tip is in the projection of the SVC. Status post median sternotomy and CABG procedure. Stable cardiomediastinal contours.  Unchanged platelike atelectasis in the left base. No signs of pleural effusion, interstitial edema or pneumothorax. IMPRESSION: Persistent platelike atelectasis in the left base. Electronically Signed   By: Kerby Moors M.D.   On: 03/08/2021 08:30    Assessment/Plan: S/P Procedure(s) (LRB): AORTIC VALVE REPLACEMENT (AVR) WITH BIOPROSTHETIC VALVE, Inpiris Resila Aortic Valve 22mm (N/A) CORONARY ARTERY BYPASS GRAFTING (CABG) x four on pump, using left internal mammary artery, left and right endoscopic greater saphenous veins conduits (N/A) TRANSESOPHAGEAL ECHOCARDIOGRAM (TEE) (N/A) ENDOVEIN HARVEST OF GREATER SAPHENOUS VEIN (Bilateral) APPLICATION OF CELL SAVER (N/A) MEDIASTINAL EXPLORATION (N/A)  1 Tmax 99.3 VSS sBP 98-149, sinus with PVC's on amio, ASA/Xerelto 2 sats good on CPAP overnight, o/w RA 3 voiding well, weight trending down, about 4 kg over preop if accurate 4 anemia stable, improved 5 BUN /creat trending up 30//1.42. may be a bit intravasc dry - no further lasix for now and recheck in am 6 BS well controlled, no meds at home with HG A1C 6.2- will need good diet management 7 cont rehab/pulm hygiene 8 poss home in am     LOS: 6 days    Treyon Giovanni PA-C Pager [phone] 03/09/2021

## 2021-03-09 NOTE — Care Management Important Message (Signed)
Important Message  Patient Details  Name: Edward [name] MRN: [id number] Date of Birth: [birth year]   Medicare Important Message Given:  Yes     Renie Ora 03/09/2021, 11:51 AM

## 2021-03-09 NOTE — Progress Notes (Signed)
CARDIAC REHAB PHASE I   Offered to walk with pt. Pt states walk with mobility team which wore him out. Began d/c education with pt and spouse. Pt educated on importance of site care and monitoring incisions daily. Encouraged continued IS use, walks, and sternal precautions. Pt given in-the-tube sheet along with heart healthy diet. Reviewed restrictions and exercise guidelines. Pt and wife would like to discuss where they would like to go to CRP II. Encouraged continued ambulation and IS use. Will continue to follow.  [id number] Edward Bowen, RN BSN 03/09/2021 3:12 PM

## 2021-03-09 NOTE — Progress Notes (Signed)
Mobility Specialist Progress Note:   03/09/21 1154  Mobility  Activity Ambulated in hall  Level of Assistance Standby assist, set-up cues, supervision of patient - no hands on  Assistive Device Front wheel walker  Distance Ambulated (ft) 230 ft  Mobility Ambulated with assistance in hallway  Mobility Response Tolerated well  Mobility performed by Mobility specialist  Bed Position Chair  $Mobility charge 1 Mobility   Pre- Mobility:  84 HR During Mobility: 107 HR Post Mobility:  87 HR  Pt received in bed willing to participate in mobility. No complaints of pain and asymptomatic. Pt returned to chair with call bell in reach, all needs met and wife present.  [facility] Health and safety inspector Phone [phone]

## 2021-03-10 LAB — GLUCOSE, CAPILLARY
Glucose-Capillary: 123 mg/dL — ABNORMAL HIGH (ref 70–99)
Glucose-Capillary: 117 mg/dL — ABNORMAL HIGH (ref 70–99)
Glucose-Capillary: 145 mg/dL — ABNORMAL HIGH (ref 70–99)
Glucose-Capillary: 146 mg/dL — ABNORMAL HIGH (ref 70–99)

## 2021-03-10 LAB — BASIC METABOLIC PANEL
Anion gap: 9 (ref 5–15)
BUN: 29 mg/dL — ABNORMAL HIGH (ref 8–23)
CO2: 25 mmol/L (ref 22–32)
Calcium: 8.5 mg/dL — ABNORMAL LOW (ref 8.9–10.3)
Chloride: 97 mmol/L — ABNORMAL LOW (ref 98–111)
Creatinine, Ser: 1.4 mg/dL — ABNORMAL HIGH (ref 0.61–1.24)
GFR, Estimated: 53 mL/min — ABNORMAL LOW (ref >60)
Glucose, Bld: 99 mg/dL (ref 70–99)
Potassium: 3.9 mmol/L (ref 3.5–5.1)
Sodium: 131 mmol/L — ABNORMAL LOW (ref 135–145)

## 2021-03-10 LAB — MAGNESIUM: Magnesium: 1.9 mg/dL (ref 1.7–2.4)

## 2021-03-10 LAB — PHOSPHORUS: Phosphorus: 4.3 mg/dL (ref 2.5–4.6)

## 2021-03-10 MED ORDER — MAGNESIUM OXIDE -MG SUPPLEMENT 400 (240 MG) MG PO TABS
400.0000 mg | ORAL_TABLET | Freq: Two times a day (BID) | ORAL | Status: AC
  Administered 2021-03-10 – 2021-03-11 (×4): 400 mg via ORAL
  Filled 2021-03-10 (×4): qty 1

## 2021-03-10 MED ORDER — AMIODARONE HCL 200 MG PO TABS
400.0000 mg | ORAL_TABLET | Freq: Every day | ORAL | Status: AC
  Administered 2021-03-10: 400 mg via ORAL
  Filled 2021-03-10: qty 2

## 2021-03-10 MED ORDER — POTASSIUM CHLORIDE CRYS ER 20 MEQ PO TBCR
20.0000 meq | EXTENDED_RELEASE_TABLET | Freq: Every day | ORAL | Status: DC
  Administered 2021-03-10: 20 meq via ORAL
  Filled 2021-03-10: qty 1

## 2021-03-10 MED FILL — Lidocaine HCl Local Preservative Free (PF) Inj 2%: INTRAMUSCULAR | Qty: 15 | Status: AC

## 2021-03-10 MED FILL — Heparin Sodium (Porcine) Inj 1000 Unit/ML: Qty: 1000 | Status: AC

## 2021-03-10 MED FILL — Potassium Chloride Inj 2 mEq/ML: INTRAVENOUS | Qty: 40 | Status: AC

## 2021-03-10 NOTE — Progress Notes (Signed)
Approximately at 745, pt experienced afib with RVR with HR up 160s. Given 3 mg metoprolol iv. PA notified. Will continue to monitor the pt.   Lawson Radar, RN

## 2021-03-10 NOTE — Progress Notes (Signed)
CARDIAC REHAB PHASE I   PRE:  Rate/Rhythm: 64 SR with PVCs  BP:  Sitting: 111/58      SaO2: 96 RA  MODE:  Ambulation: 340 ft   POST:  Rate/Rhythm: 81 SR  BP:  Sitting: 122/82    SaO2: 100 RA   Pt agreeable to ambulate. Pt min assit to stand, and standby to ambulate in hallway with front wheel walker. Pt states some SOB and fatigue. Denies CP or dizziness. Pt returned to recliner. Encouraged continued IS use and ambulation. Questions and concerns addressed. Will continue to follow. Pt denies DME needs at this time.  [id number] Reynold Bowen, RN BSN 03/10/2021 1:37 PM

## 2021-03-10 NOTE — Progress Notes (Addendum)
[address]       [place] [address]             [phone]      7 Days Post-Op Procedure(s) (LRB): AORTIC VALVE REPLACEMENT (AVR) WITH BIOPROSTHETIC VALVE, Inpiris Resila Aortic Valve 23mm (N/A) CORONARY ARTERY BYPASS GRAFTING (CABG) x four on pump, using left internal mammary artery, left and right endoscopic greater saphenous veins conduits (N/A) TRANSESOPHAGEAL ECHOCARDIOGRAM (TEE) (N/A) ENDOVEIN HARVEST OF GREATER SAPHENOUS VEIN (Bilateral) APPLICATION OF CELL SAVER (N/A) MEDIASTINAL EXPLORATION (N/A) Subjective: Bursts of afib this am into 140's  Objective: Vital signs in last 24 hours: Temp:  [97.4 F (36.3 C)-98.8 F (37.1 C)] 97.4 F (36.3 C) (11/16 0759) Pulse Rate:  [75-120] 120 (11/16 0759) Cardiac Rhythm: Normal sinus rhythm (11/15 1900) Resp:  [20] 20 (11/16 0759) BP: (104-144)/(66-86) 104/86 (11/16 0759) SpO2:  [92 %-99 %] 97 % (11/16 0759)  Hemodynamic parameters for last 24 hours:    Intake/Output from previous day: 11/15 0701 - 11/16 0700 In: -  Out: 600 [Urine:600] Intake/Output this shift: No intake/output data recorded.  General appearance: alert, cooperative, and no distress Heart: regular rate and rhythm Lungs: min dim in right base Abdomen: benign Extremities: no edema Wound: thigh echymosis is stable  Lab Results: Recent Labs    03/08/21 0302 03/09/21 0140  WBC 5.9 9.6  HGB 9.0* 9.2*  HCT 26.4* 27.7*  PLT 145* 195   BMET:  Recent Labs    03/09/21 0140 03/10/21 0151  NA 134* 131*  K 4.2 3.9  CL 98 97*  CO2 27 25  GLUCOSE 109* 99  BUN 30* 29*  CREATININE 1.42* 1.40*  CALCIUM 8.6* 8.5*    PT/INR: No results for input(s): LABPROT, INR in the last 72 hours. ABG    Component Value Date/Time   PHART 7.378 03/04/2021 0848   HCO3 21.3 03/04/2021 0848   TCO2 22 03/04/2021 0848   ACIDBASEDEF 3.0 (H) 03/04/2021 0848   O2SAT 98.0 03/04/2021 0848   CBG (last 3)  Recent Labs    03/09/21 1606  03/09/21 2209 03/10/21 0620  GLUCAP 104* 111* 123*    Meds Scheduled Meds:  sodium chloride   Intravenous Once   sodium chloride   Intravenous Once   amiodarone  200 mg Oral BID   aspirin EC  325 mg Oral Daily   Or   aspirin  324 mg Per Tube Daily   bisacodyl  10 mg Oral Daily   Or   bisacodyl  10 mg Rectal Daily   Chlorhexidine Gluconate Cloth  6 each Topical Daily   Chlorhexidine Gluconate Cloth  6 each Topical Daily   [place] [facility], Patient & Family Education   Does not apply Once   docusate  200 mg Oral Daily   insulin aspart  0-15 Units Subcutaneous TID WC   insulin detemir  5 Units Subcutaneous BID   mouth rinse  15 mL Mouth Rinse BID   metoprolol tartrate  50 mg Oral BID   pantoprazole sodium  40 mg Oral Daily   potassium chloride  40 mEq Oral Daily   predniSONE  6 mg Oral Daily   pyridostigmine  30 mg Oral Q8H   rivaroxaban  20 mg Oral Q supper   sodium chloride flush  10-40 mL Intracatheter Q12H   sodium chloride flush  3 mL Intravenous Q12H   Continuous Infusions:  lactated ringers 20 mL/hr at 03/03/21 1855   lactated ringers  Stopped (03/08/21 0800)   PRN Meds:.fentaNYL (SUBLIMAZE) injection, melatonin, metoprolol tartrate, ondansetron (ZOFRAN) IV, oxyCODONE, sodium chloride flush, sodium chloride flush, temazepam, traMADol  Xrays No results found.  Assessment/Plan: S/P Procedure(s) (LRB): AORTIC VALVE REPLACEMENT (AVR) WITH BIOPROSTHETIC VALVE, Inpiris Resila Aortic Valve 45mm (N/A) CORONARY ARTERY BYPASS GRAFTING (CABG) x four on pump, using left internal mammary artery, left and right endoscopic greater saphenous veins conduits (N/A) TRANSESOPHAGEAL ECHOCARDIOGRAM (TEE) (N/A) ENDOVEIN HARVEST OF GREATER SAPHENOUS VEIN (Bilateral) APPLICATION OF CELL SAVER (N/A) MEDIASTINAL EXPLORATION (N/A)  1 afeb, VSS having some afib with RVR, currently in sinus rhythm- on po amio, xerelto 2 sats good on RA 3 creat stable at 1.4, lasix stopped 4 Mg++  a little low, will replace 5 voiding well 6 home soon when afib issues resolved, push rehab and routine pulm hygeine     LOS: 7 days    Rowe Clack PA-C Pager [phone] 03/10/2021    Afib this am.  Resolve with 1 dose of metop Will watch for 24hrs Dispo planning  Kinslee Dalpe O Jaclyne Haverstick

## 2021-03-10 NOTE — Progress Notes (Signed)
Mobility Specialist: Progress Note   03/10/21 1101  Mobility  Activity Ambulated in hall  Level of Assistance Minimal assist, patient does 75% or more  Assistive Device Front wheel walker  Distance Ambulated (ft) 340 ft  Mobility Ambulated with assistance in hallway  Mobility Response Tolerated well  Mobility performed by Mobility specialist  $Mobility charge 1 Mobility   Pre-Mobility: 79 HR During Mobility: 91 HR Post-Mobility: 103 HR, 128/64 BP, 100% SpO2  Pt asleep upon entering room but easily awakened. Pt required minA to sit EOB from supine but was standby assist to stand. Pt c/o not getting much sleep last night but otherwise no c/o. Pt back to bed after walk with call bell at his side and pt's wife present in the room.   [facility] Jozeph Persing Mobility Specialist Mobility Specialist Phone: [phone]

## 2021-03-10 NOTE — Progress Notes (Signed)
Mobility Specialist: Progress Note   03/10/21 1736  Mobility  Activity Ambulated in hall  Level of Assistance Standby assist, set-up cues, supervision of patient - no hands on  Assistive Device Front wheel walker  Distance Ambulated (ft) 400 ft  Mobility Ambulated with assistance in hallway  Mobility Response Tolerated well  Mobility performed by Mobility specialist  Bed Position Chair  $Mobility charge 1 Mobility   Post-Mobility: 67 HR  Pt standby assist to stand and mod I during ambulation. Pt said he feels much better this evening as well. Pt back to the recliner after walk with call bell in reach. Requesting shower chair for home, case manager made aware.   [facility] Edward [name] Mobility Specialist Mobility Specialist Phone: [phone]

## 2021-03-11 LAB — GLUCOSE, CAPILLARY
Glucose-Capillary: 99 mg/dL (ref 70–99)
Glucose-Capillary: 110 mg/dL — ABNORMAL HIGH (ref 70–99)
Glucose-Capillary: 129 mg/dL — ABNORMAL HIGH (ref 70–99)
Glucose-Capillary: 115 mg/dL — ABNORMAL HIGH (ref 70–99)

## 2021-03-11 LAB — BASIC METABOLIC PANEL
Anion gap: 8 (ref 5–15)
BUN: 38 mg/dL — ABNORMAL HIGH (ref 8–23)
CO2: 26 mmol/L (ref 22–32)
Calcium: 8.7 mg/dL — ABNORMAL LOW (ref 8.9–10.3)
Chloride: 100 mmol/L (ref 98–111)
Creatinine, Ser: 1.67 mg/dL — ABNORMAL HIGH (ref 0.61–1.24)
GFR, Estimated: 43 mL/min — ABNORMAL LOW (ref >60)
Glucose, Bld: 109 mg/dL — ABNORMAL HIGH (ref 70–99)
Potassium: 4.5 mmol/L (ref 3.5–5.1)
Sodium: 134 mmol/L — ABNORMAL LOW (ref 135–145)

## 2021-03-11 LAB — MAGNESIUM: Magnesium: 2.1 mg/dL (ref 1.7–2.4)

## 2021-03-11 LAB — PHOSPHORUS: Phosphorus: 4.2 mg/dL (ref 2.5–4.6)

## 2021-03-11 MED ORDER — ASPIRIN 81 MG PO CHEW
81.0000 mg | CHEWABLE_TABLET | Freq: Every day | ORAL | Status: DC
  Filled 2021-03-11: qty 1

## 2021-03-11 MED ORDER — AMIODARONE HCL 200 MG PO TABS
400.0000 mg | ORAL_TABLET | Freq: Two times a day (BID) | ORAL | Status: DC
  Administered 2021-03-11 – 2021-03-12 (×3): 400 mg via ORAL
  Filled 2021-03-11 (×3): qty 2

## 2021-03-11 MED ORDER — ATORVASTATIN CALCIUM 80 MG PO TABS
80.0000 mg | ORAL_TABLET | Freq: Every day | ORAL | Status: DC
  Administered 2021-03-11: 21:00:00 80 mg via ORAL
  Filled 2021-03-11: qty 1

## 2021-03-11 MED ORDER — ALBUMIN HUMAN 25 % IV SOLN
12.5000 g | Freq: Once | INTRAVENOUS | Status: AC
  Administered 2021-03-11: 10:00:00 12.5 g via INTRAVENOUS
  Filled 2021-03-11: qty 50

## 2021-03-11 MED ORDER — ASPIRIN EC 81 MG PO TBEC
81.0000 mg | DELAYED_RELEASE_TABLET | Freq: Every day | ORAL | Status: DC
  Administered 2021-03-11 – 2021-03-12 (×2): 81 mg via ORAL
  Filled 2021-03-11 (×2): qty 1

## 2021-03-11 MED ORDER — METOPROLOL TARTRATE 25 MG PO TABS
25.0000 mg | ORAL_TABLET | Freq: Two times a day (BID) | ORAL | Status: DC
  Administered 2021-03-11 – 2021-03-12 (×2): 25 mg via ORAL
  Filled 2021-03-11 (×2): qty 1

## 2021-03-11 MED FILL — Electrolyte-R (PH 7.4) Solution: INTRAVENOUS | Qty: 4000 | Status: AC

## 2021-03-11 MED FILL — Sodium Chloride IV Soln 0.9%: INTRAVENOUS | Qty: 2000 | Status: AC

## 2021-03-11 MED FILL — Sodium Bicarbonate IV Soln 8.4%: INTRAVENOUS | Qty: 50 | Status: AC

## 2021-03-11 MED FILL — Heparin Sodium (Porcine) Inj 1000 Unit/ML: INTRAMUSCULAR | Qty: 10 | Status: AC

## 2021-03-11 MED FILL — Mannitol IV Soln 20%: INTRAVENOUS | Qty: 500 | Status: AC

## 2021-03-11 NOTE — Progress Notes (Signed)
Spoke with Lowella Dandy, PA with TCTS and informed her that AM Metoprolol was held bc BP  was 90/69.Later during the shift a prn dose of Metoprolol was given for a HR in the 134. Pt asymptomatic. Pt takes Metoprolol 25 mg 24 hr tablet at home and is scheduled to receive Metoprolol 50mg  PO BID inpatient. said that she will relay message to Denny Peon, PA who is on call ,but is is currently  in the OR with Dr. Doree Fudge at this time.   Shahmeer Bunn M

## 2021-03-11 NOTE — Discharge Summary (Signed)
Physician Discharge Summary       [address] [place].[address]       [place] [address]             [phone]    Patient ID: ABEM SHADDIX MRN: [id number] DOB/AGE: May 04, 1945 75 y.o.  Admit date: 03/03/2021 Discharge date: 03/12/2021  Admission Diagnoses: Coronary artery disease Discharge Diagnoses:  S/p CABG x 4 2. Expected post op blood loss anemia 3. 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 by echo 07/2017   Chronic kidney disease      kidney stones   Colon polyps     Diplopia 10/15/2015   Diverticulosis     Erectile dysfunction     GERD (gastroesophageal reflux disease)      with Avyan Livesay's esophagus   Gout     Hearing loss      hearing aides   HTN (hypertension)     Hypercholesteremia     Migraine headache     Myasthenia gravis (HCC)     Obesity     OSA (obstructive sleep apnea)      AHI 43/hr now on CPAP at 10cm H2O   Peripheral neuropathy 01/14/2019   Right foot drop 12/03/2018    Consults: pulmonary/intensive care  Procedure (s):  Aortic valve replacement with 33mm Inspiris Resilia valve CABG X 4.  LIMA LAD, RSVG PDA, OM2, D2 (y-graft off OM2 vein   Endoscopic greater saphenous vein harvest on the right and left thigh by Dr. Cliffton Asters on 03/03/2021.    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  He also has a history of myasthenia gravis with mostly ocular symptoms.  He has had some progressive dyspnea, and underwent a left heart catheterization which identified the disease.  During the valve analysis is a gradients were consistent with moderate aortic stenosis.  In regards to his myasthenia he is on Mestinon as well as 6 mg of prednisone.  He is able to self titrate this based off of his symptoms.The patient and all relavent  studies were evaluated by Dr Cliffton Asters who recommended proceeding with CABG/AVR and he was admitted for the procedure.   Hospital Course: Patient underwent a CABG x 4/AVR on 03/03/2021. He was going to be transferred from the OR to ICU;however, because of increased chest tube output, his sternum was re explored. There was a superficial bleeder identified. Patient was then transferred from the OR to [facility] ICU in stable condition. Patient was extubated 11/10. He was weaned off the Levophed drip. He was volume overloaded but not diuresed until off pressors. He was weaned off the Insulin drip on 11/11. His pre op HGA1C is 6.2. He likely has prediabetes and has been on steroids for Myasthenia Gravis for sometime. He did have expected post op blood loss anemia and required a transfusion on 11/11. He had thrombocytopenia post op. His platelet count went as low as 62K but recovered without intervention. A line and foley were removed on 11/11.  His expected acute blood loss anemia has stabilized.  He did develop a postoperative atrial fibrillation which was managed with beta-blocker and amiodarone.  He does have postoperative expected volume overload and is aggressively being diuresed.  His creatinine peaked at 1.32 and is stabilized to normal.  His  chest tubes were removed on postop day #4.  He was started on a course of routine cardiac rehab mobilization as well as aggressive pulmonary hygiene.  He is tachycardic at times and his lopressor was titrated accordingly.  He was felt medically stable for transfer to the progressive care unit on 03/10/2021. His creatinine increased to 1.67. Of note, patient has a history of CKD. His creatinine upon admission was 1.25. He had PAF and at times, HR was increased so Amiodarone was increased to 400 mg bid. He is already on Rivaroxaban 20 mg at supper.  The patient is maintaining NSR.  His surgical incisions are healing without evidence of infection.  He is medically stable for  discharge home today.   Latest Vital Signs: Blood pressure 119/77, pulse (!) 103, temperature 97.8 F (36.6 C), temperature source Oral, resp. rate 18, height 5\' 9"  (1.753 m), weight 95.5 kg, SpO2 95 %.  Physical Exam:  General appearance: alert, cooperative, and no distress Heart: regular rate and rhythm Lungs: clear to auscultation bilaterally Abdomen: soft, non-tender; bowel sounds normal; no masses,  no organomegaly Extremities: extremities normal, atraumatic, no cyanosis or edema Wound: clean and dry  Discharge Condition:Stable and discharged to home.  Recent laboratory studies:  Lab Results  Component Value Date   WBC 9.6 03/09/2021   HGB 9.2 (L) 03/09/2021   HCT 27.7 (L) 03/09/2021   MCV 90.8 03/09/2021   PLT 195 03/09/2021   Lab Results  Component Value Date   NA 135 03/12/2021   K 4.3 03/12/2021   CL 102 03/12/2021   CO2 25 03/12/2021   CREATININE 1.61 (H) 03/12/2021   GLUCOSE 104 (H) 03/12/2021      Diagnostic Studies: DG Chest 2 View  Result Date: 03/02/2021 CLINICAL DATA:  75 year old male under preoperative evaluation. History of coronary artery disease and aortic valve stenosis. EXAM: CHEST - 2 VIEW COMPARISON:  Chest x-ray 01/04/2019. FINDINGS: Lung volumes are normal. No consolidative airspace disease. No pleural effusions. No pneumothorax. No pulmonary nodule or mass noted. Pulmonary vasculature and the cardiomediastinal silhouette are within normal limits. IMPRESSION: No radiographic evidence of acute cardiopulmonary disease. Electronically Signed   By: 03/06/2019 M.D.   On: 03/02/2021 10:56   CARDIAC CATHETERIZATION  Result Date: 02/11/2021   Mid LAD-1 lesion is 90% stenosed.   Mid LAD-2 lesion is 90% stenosed.   Mid LAD-3 lesion is 80% stenosed.   1st Mrg lesion is 90% stenosed.   Prox Cx to Mid Cx lesion is 70% stenosed.   Prox RCA lesion is 100% stenosed.   The left ventricular systolic function is normal.   LV end diastolic pressure is normal.    The left ventricular ejection fraction is 55-65% by visual estimate.   There is moderate aortic valve stenosis.   There is no mitral valve regurgitation. Severe 3 vessel obstructive CAD Moderate aortic stenosis. Mean gradient 22 mm Hg. AVA 1.49 cm squared, index 0.71 Normal LV filling pressures. Normal right heart pressures Normal cardiac output. Plan: refer to CT surgery for CABG and AVR.   DG CHEST PORT 1 VIEW  Result Date: 03/08/2021 CLINICAL DATA:  Shortness of breath.  Recent CABG. EXAM: PORTABLE CHEST 1 VIEW COMPARISON:  03/07/2021 FINDINGS: Right IJ catheter tip is in the projection of the SVC. Status post median sternotomy and CABG procedure. Stable cardiomediastinal contours. Unchanged platelike atelectasis in the left base. No signs of pleural effusion, interstitial edema or pneumothorax. IMPRESSION: Persistent platelike atelectasis in the left base. Electronically Signed  By: Signa Kell M.D.   On: 03/08/2021 08:30   DG CHEST PORT 1 VIEW  Result Date: 03/07/2021 CLINICAL DATA:  74 year old male status post aortic valve replacement. EXAM: PORTABLE CHEST - 1 VIEW COMPARISON:  03/06/2021 FINDINGS: Unchanged cardiomediastinal silhouette. Postsurgical changes after coronary artery bypass graft and aortic valve replacement. Unchanged position of right IJ central venous catheter with the catheter tip in the right innominate vein. Similar appearance of right mediastinal and left thoracostomy tubes. Slightly improved aeration bilaterally with mild bibasilar subsegmental atelectasis. No pneumothorax. No significant pleural effusion. No acute osseous abnormality. IMPRESSION: Improved aeration bilaterally with mild bibasilar subsegmental atelectasis. Electronically Signed   By: Marliss Coots M.D.   On: 03/07/2021 08:38   DG CHEST PORT 1 VIEW  Result Date: 03/06/2021 CLINICAL DATA:  75 year old male with history of pleural effusion. EXAM: PORTABLE CHEST 1 VIEW COMPARISON:  Chest x-ray 03/05/2021.  FINDINGS: Right IJ Vas-Cath with tip terminating in the distal superior vena cava. Status post median sternotomy for CABG. Lung volumes are low. Bibasilar opacities may reflect areas of atelectasis and/or consolidation, with superimposed small bilateral pleural effusions, similar to the prior study. No pneumothorax. No evidence of pulmonary edema. Heart size is mildly enlarged. The patient is rotated to the right on today's exam, resulting in distortion of the mediastinal contours and reduced diagnostic sensitivity and specificity for mediastinal pathology. Atherosclerotic calcifications in the thoracic aorta. IMPRESSION: 1. Support apparatus, as above. 2. Persistent low lung volumes with bibasilar areas of atelectasis and/or consolidation with superimposed small bilateral pleural effusions. Electronically Signed   By: Trudie Reed M.D.   On: 03/06/2021 08:54   DG CHEST PORT 1 VIEW  Result Date: 03/05/2021 CLINICAL DATA:  Chest tube present, status post open heart surgery EXAM: PORTABLE CHEST 1 VIEW COMPARISON:  Chest radiograph dated March 04, 2021 head FINDINGS: The cardiomediastinal silhouette is enlarged but stable. Sternotomy wires and mediastinal surgical drain are unchanged. Interval well removal of endotracheal tube tube and feeding tube. Right IJ access central line is unchanged. Bilateral lower lobe hazy opacities concerning for atelectasis or small pleural effusions are also unchanged IMPRESSION: 1. Cardiomegaly with postsurgical changes and mediastinal drain are unchanged. 2. Bilateral lower lobe hazy opacities concerning for atelectasis or small pleural effusions, unchanged. 3.  Interval removal of endotracheal tube and feeding tube. Electronically Signed   By: Larose Hires D.O.   On: 03/05/2021 08:21   DG Chest Port 1 View  Result Date: 03/04/2021 CLINICAL DATA:  Tube placement EXAM: PORTABLE CHEST 1 VIEW COMPARISON:  Chest x-ray 03/03/2021 FINDINGS: Endotracheal tube tip is 3 3.8 cm  above the carina. Enteric tube tip is in the stomach. Right internal jugular line tip in the SVC. Mediastinal drain stable. Cardiomegaly and mediastinum appear grossly unchanged. Cardiac surgical changes and median sternotomy wires. Mildly low lung volumes with increased opacities in the lower lung zones. No pneumothorax. IMPRESSION: 1. Medical devices as described. 2. Low lung volumes with increased opacities bilaterally mostly in the lower lung zones which may represent atelectasis, edema or infiltrate. Electronically Signed   By: Jannifer Hick M.D.   On: 03/04/2021 08:17   DG Chest Port 1 View  Result Date: 03/03/2021 CLINICAL DATA:  Post CABG, AVR EXAM: PORTABLE CHEST 1 VIEW COMPARISON:  03/01/2021 FINDINGS: Interval intubation, tip of the endotracheal tube is about 4.9 cm superior to carina. Esophageal tube tip overlies the stomach. Interval post sternotomy changes and aortic valve prosthesis. Placement mediastinal and chest drainage catheters. Right IJ central  venous catheter sheath tip over the SVC. No definitive pneumothorax. Small pleural effusions and basilar airspace disease. IMPRESSION: 1. Interim placement of support lines and tubes as above with interval postsurgical changes of the mediastinum. No visible pneumothorax 2. Small bilateral effusions with basilar airspace disease most likely atelectasis Electronically Signed   By: Jasmine Pang M.D.   On: 03/03/2021 18:22   ECHO INTRAOPERATIVE TEE  Result Date: 03/07/2021  *INTRAOPERATIVE TRANSESOPHAGEAL REPORT *  Patient Name:   Edward [name]   Date of Exam: 03/03/2021 Medical Rec #:  [id number]      Height:       69.0 in Accession #:    [id number]     Weight:       204.0 lb Date of Birth:  [birth year]       BSA:          2.08 m Patient Age:    75 years       BP:           117/57 mmHg Patient Gender: M              HR:           67 bpm. Exam Location:  Anesthesiology Transesophogeal exam was perform intraoperatively during surgical procedure.  Patient was closely monitored under general anesthesia during the entirety of examination. Indications:     Aortic Valve Disease, Coronary Artery Disease Sonographer:     Eulah Pont RDCS Performing Phys: [id number] Eliezer Lofts LIGHTFOOT Diagnosing Phys: Marcene Duos MD Complications: No known complications during this procedure. POST-OP IMPRESSIONS _ Left Ventricle: The left ventricle is unchanged from pre-bypass. _ Right Ventricle: The right ventricle appears unchanged from pre-bypass. _ Aorta: The aorta appears unchanged from pre-bypass. _ Left Atrium: The left atrium appears unchanged from pre-bypass. _ Left Atrial Appendage: The left atrial appendage appears unchanged from pre-bypass. _ Aortic Valve: A bioprosthetic valve was placed, leaflets are freely mobile and leaflets thin. The gradient recorded across the prosthetic valve is within the expected range. No perivalvular leak noted. _ Mitral Valve: The mitral valve appears unchanged from pre-bypass. _ Tricuspid Valve: The tricuspid valve appears unchanged from pre-bypass. _ Pulmonic Valve: The pulmonic valve appears unchanged from pre-bypass. _ Interatrial Septum: The interatrial septum appears unchanged from pre-bypass. _ Interventricular Septum: The interventricular septum appears unchanged from pre-bypass. _ Pericardium: The pericardium appears unchanged from pre-bypass. PRE-OP FINDINGS  Left Ventricle: The left ventricle has normal systolic function, with an ejection fraction of 55-60%. The cavity size was normal. Concentric left ventricular hypertrophy. Right Ventricle: The right ventricle has normal systolic function. The cavity was normal. There is no increase in right ventricular wall thickness. Left Atrium: Left atrial size was normal in size. No left atrial/left atrial appendage thrombus was detected. Right Atrium: Right atrial size was normal in size. Interatrial Septum: No atrial level shunt detected by color flow Doppler. Pericardium: There is  no evidence of pericardial effusion. Mitral Valve: The mitral valve is normal in structure. Mitral valve regurgitation is moderate by color flow Doppler. There is No evidence of mitral stenosis. Tricuspid Valve: The tricuspid valve was normal in structure. Tricuspid valve regurgitation was not visualized by color flow Doppler. Aortic Valve: The aortic valve is tricuspid Aortic valve regurgitation is moderate by color flow Doppler. There is moderate stenosis of the aortic valve, with a calculated valve area of 2.12 cm. There is severe thickening and moderate calcification present on the aortic valve right coronary, non-coronary and left coronary cusps  with moderately decreased mobility. Pulmonic Valve: The pulmonic valve was normal in structure, with normal. Pulmonic valve regurgitation is trivial by color flow Doppler. Aorta: The aortic root and ascending aorta are normal in size and structure. There is evidence of plaque in the aortic arch and descending aorta; Grade V, >=57mm and mobile. +--------------+--------++ LEFT VENTRICLE         +--------------+--------++ PLAX 2D                +--------------+--------++ LVOT diam:    2.30 cm  +--------------+--------++ LVOT Area:    4.15 cm +--------------+--------++                        +--------------+--------++ +------------------+------------++ AORTIC VALVE                   +------------------+------------++ AV Area (Vmax):   2.19 cm     +------------------+------------++ AV Area (Vmean):  1.99 cm     +------------------+------------++ AV Area (VTI):    2.12 cm     +------------------+------------++ AV Vmax:          233.00 cm/s  +------------------+------------++ AV Vmean:         171.000 cm/s +------------------+------------++ AV VTI:           0.576 m      +------------------+------------++ AV Peak Grad:     21.7 mmHg    +------------------+------------++ AV Mean Grad:     14.5 mmHg     +------------------+------------++ LVOT Vmax:        123.00 cm/s  +------------------+------------++ LVOT Vmean:       82.100 cm/s  +------------------+------------++ LVOT VTI:         0.294 m      +------------------+------------++ LVOT/AV VTI ratio:0.51         +------------------+------------++ AR PHT:           776 msec     +------------------+------------++  +--------------+-------+ SHUNTS                +--------------+-------+ Systemic VTI: 0.29 m  +--------------+-------+ Systemic Diam:2.30 cm +--------------+-------+  Marcene Duos MD Electronically signed by Marcene Duos MD Signature Date/Time: 03/07/2021/5:17:22 AM    Final    VAS US DOPPLER PRE CABG  Result Date: 03/01/2021 PREOPERATIVE VASCULAR EVALUATION Patient Name:  KHAM ZUCKERMAN  Date of Exam:   03/01/2021 Medical Rec #: [id number]     Accession #:    [id number] Date of Birth: Mar 05, 1946      Patient Gender: M Patient Age:   64 years Exam Location:  [facility] Procedure:      VAS US DOPPLER PRE CABG Referring Phys: HARRELL LIGHTFOOT --------------------------------------------------------------------------------  Indications:      Pre-CABG. Risk Factors:     Hypertension, hyperlipidemia. Comparison Study: no prior Performing Technologist: [place] Ponder RVS  Examination Guidelines: A complete evaluation includes B-mode imaging, spectral Doppler, color Doppler, and power Doppler as needed of all accessible portions of each vessel. Bilateral testing is considered an integral part of a complete examination. Limited examinations for reoccurring indications may be performed as noted.  Right Carotid Findings: +----------+--------+--------+--------+-------------------------+--------+           PSV cm/sEDV cm/sStenosisDescribe                 Comments +----------+--------+--------+--------+-------------------------+--------+ CCA Prox  95      16              heterogenous                       +----------+--------+--------+--------+-------------------------+--------+  CCA Distal90      12              heterogenous                      +----------+--------+--------+--------+-------------------------+--------+ ICA Prox  228     63      40-59%  heterogenous and calcific         +----------+--------+--------+--------+-------------------------+--------+ ICA Mid   123     29                                                +----------+--------+--------+--------+-------------------------+--------+ ICA Distal93      28                                                +----------+--------+--------+--------+-------------------------+--------+ ECA       139                                                       +----------+--------+--------+--------+-------------------------+--------+ +----------+--------+-------+--------+------------+           PSV cm/sEDV cmsDescribeArm Pressure +----------+--------+-------+--------+------------+ Subclavian115                                 +----------+--------+-------+--------+------------+ +---------+--------+--+--------+--+---------+ VertebralPSV cm/s52EDV cm/s15Antegrade +---------+--------+--+--------+--+---------+ Left Carotid Findings: +----------+--------+--------+--------+------------+--------+           PSV cm/sEDV cm/sStenosisDescribe    Comments +----------+--------+--------+--------+------------+--------+ CCA Prox  101     11              heterogenous         +----------+--------+--------+--------+------------+--------+ CCA Distal97      11              heterogenous         +----------+--------+--------+--------+------------+--------+ ICA Prox  113     32      1-39%   heterogenous         +----------+--------+--------+--------+------------+--------+ ICA Distal115     36                                   +----------+--------+--------+--------+------------+--------+ ECA       128                                           +----------+--------+--------+--------+------------+--------+ +----------+--------+--------+--------+------------+ SubclavianPSV cm/sEDV cm/sDescribeArm Pressure +----------+--------+--------+--------+------------+           102                                  +----------+--------+--------+--------+------------+ +---------+--------+--+--------+--+---------+ VertebralPSV cm/s72EDV cm/s15Antegrade +---------+--------+--+--------+--+---------+  ABI Findings: +--------+------------------+-----+---------+--------+ Right   Rt Pressure (mmHg)IndexWaveform Comment  +--------+------------------+-----+---------+--------+ [id number]                    triphasic         +--------+------------------+-----+---------+--------+  PTA     133               1.16 triphasic         +--------+------------------+-----+---------+--------+ DP      159               1.38 triphasic         +--------+------------------+-----+---------+--------+ +--------+------------------+-----+---------+-------+ Left    Lt Pressure (mmHg)IndexWaveform Comment +--------+------------------+-----+---------+-------+ [id number]                    triphasic        +--------+------------------+-----+---------+-------+ PTA     187               1.63 triphasic        +--------+------------------+-----+---------+-------+ DP      140               1.22 biphasic         +--------+------------------+-----+---------+-------+ +-------+---------------+----------------+ ABI/TBIToday's ABI/TBIPrevious ABI/TBI +-------+---------------+----------------+ Right  1.38                            +-------+---------------+----------------+ Left   1.63                            +-------+---------------+----------------+  Right Doppler Findings: +--------+--------+-----+---------+--------+ Site    PressureIndexDoppler  Comments  +--------+--------+-----+---------+--------+ [id number]          triphasic         +--------+--------+-----+---------+--------+ Radial               triphasic         +--------+--------+-----+---------+--------+ Ulnar                triphasic         +--------+--------+-----+---------+--------+  Left Doppler Findings: +--------+--------+-----+---------+--------+ Site    PressureIndexDoppler  Comments +--------+--------+-----+---------+--------+ [id number]          triphasic         +--------+--------+-----+---------+--------+ Radial               triphasic         +--------+--------+-----+---------+--------+ Ulnar                triphasic         +--------+--------+-----+---------+--------+  Summary: Right Carotid: Velocities in the right ICA are consistent with a 40-59%                stenosis. Left Carotid: Velocities in the left ICA are consistent with a 1-39% stenosis. Vertebrals: Bilateral vertebral arteries demonstrate antegrade flow. Right ABI: Resting right ankle-brachial index indicates noncompressible right lower extremity arteries. Left ABI: Resting left ankle-brachial index indicates noncompressible left lower extremity arteries. Right Upper Extremity: Doppler waveforms remain within normal limits with right radial compression. Doppler waveforms remain within normal limits with right ulnar compression. Left Upper Extremity: Doppler waveforms remain within normal limits with left radial compression. Doppler waveforms remain within normal limits with left ulnar compression.  Electronically signed by Lemar Livings MD on 03/01/2021 at 2:02:35 PM.    Final       Discharge Medications: Allergies as of 03/12/2021   No Known Allergies      Medication List     STOP taking these medications    amLODipine 5 MG tablet Commonly known as: NORVASC   isosorbide mononitrate 30 MG 24 hr tablet Commonly known as: IMDUR   losartan 50 MG tablet Commonly  known  as: COZAAR   metoprolol succinate 25 MG 24 hr tablet Commonly known as: TOPROL-XL   nitroGLYCERIN 0.4 MG SL tablet Commonly known as: NITROSTAT       TAKE these medications    acetaminophen 500 MG tablet Commonly known as: TYLENOL Take 1,000 mg by mouth every 6 (six) hours as needed for moderate pain or mild pain.   allopurinol 300 MG tablet Commonly known as: ZYLOPRIM Take 300 mg by mouth daily.   amiodarone 200 MG tablet Commonly known as: PACERONE Take 2 tablets (400 mg total) by mouth 2 (two) times daily. X 7 days, then decrease to 200 mg BID x 7 days, then to 200 mg daily   aspirin EC 81 MG tablet Take 81 mg by mouth daily. Swallow whole.   atorvastatin 80 MG tablet Commonly known as: Lipitor Take 1 tablet (80 mg total) by mouth daily.   Biotin 1000 MCG tablet Take 1,000 mcg by mouth daily.   cetirizine 10 MG tablet Commonly known as: ZYRTEC Take 10 mg by mouth daily.   fish oil-omega-3 fatty acids 1000 MG capsule Take 1 g by mouth at bedtime.   lidocaine 5 % Commonly known as: Lidoderm Place 1 patch onto the skin daily. Remove & Discard patch within 12 hours or as directed by MD What changed:  when to take this reasons to take this   metoprolol tartrate 25 MG tablet Commonly known as: LOPRESSOR Take 1 tablet (25 mg total) by mouth 2 (two) times daily.   multivitamin capsule Take 1 capsule by mouth daily.   pantoprazole 40 MG tablet Commonly known as: PROTONIX Take 40 mg by mouth daily.   predniSONE 1 MG tablet Commonly known as: DELTASONE Take 1 tablet (1 mg total) by mouth daily. What changed: additional instructions   predniSONE 5 MG tablet Commonly known as: DELTASONE TAKE 1 TABLET BY MOUTH EVERY DAY What changed:  when to take this additional instructions   pyridostigmine 60 MG tablet Commonly known as: MESTINON 1/2 tablet three times a day   traMADol 50 MG tablet Commonly known as: ULTRAM Take 1 tablet (50 mg total) by mouth  every 4 (four) hours as needed for moderate pain.   traZODone 50 MG tablet Commonly known as: DESYREL TAKE 1 TABLET BY MOUTH EVERYDAY AT BEDTIME What changed: See the new instructions.   Xarelto 20 MG Tabs tablet Generic drug: rivaroxaban Take 20 mg by mouth daily.       The patient has been discharged on:   1.Beta Blocker:  Yes [ x  ]                              No   [   ]                              If No, reason:  2.Ace Inhibitor/ARB: Yes [   ]                                     No  [  x  ]                                     If No, reason:Elevated creatinine  3.Statin:   Yes [  x ]                  No  [   ]                  If No, reason:  4.Ecasa:  Yes  [  x ]                  No   [   ]                  If No, reason:  Patient had ACS upon admission:NO  Plavix/P2Y12 inhibitor: Yes [   ]                                      No  [  x ]   Follow Up Appointments:  Follow-up Information     Lightfoot, Eliezer Lofts, MD Follow up.   Specialty: Cardiothoracic Surgery Why: PLEASE SEE DISCHARGE PAPERWORK FOR FOLLOW UP APPOINTMENT WITH DR LIGHTFOOT. FIRST APPOINTMENT WILL BE VIDEO APPT, DO NOT GO TO THE OFFICE Contact information: [address] [place] [address] [phone]         Little Ishikawa, MD. Go on 03/17/2021.   Specialties: Cardiology, Radiology Why: Appointment time is at 9:00 am Contact information: [address] [place] [address] [phone]                 Signed: Carl Best 03/12/2021, 9:58 AM

## 2021-03-11 NOTE — Progress Notes (Signed)
CARDIAC REHAB PHASE I   Went to offer to walk with pt. Pt in Afib rates 100-130s with 2-6 beat runs of PVCs. Pt asymptomatic eating lunch. Will f/u to ambulate as appropriate.  Reynold Bowen, RN BSN 03/11/2021 1:22 PM

## 2021-03-11 NOTE — Progress Notes (Signed)
CARDIAC REHAB PHASE I   Offered to walk with pt. Pt feeling dejected this morning as he has to stay another night. Provided support and encouragement. Pt requesting some time for rest with plans for ambulation later. Will continue to follow.  [id number] Reynold Bowen, RN BSN 03/11/2021 9:43 AM

## 2021-03-11 NOTE — Plan of Care (Signed)

## 2021-03-11 NOTE — Plan of Care (Signed)
  Problem: Education: Goal: Knowledge of General Education information will improve Description Including pain rating scale, medication(s)/side effects and non-pharmacologic comfort measures Outcome: Progressing   

## 2021-03-11 NOTE — Progress Notes (Addendum)
      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 He walked several times yesterday.  Objective: Vital signs in last 24 hours: Temp:  [97.4 F (36.3 C)-98.7 F (37.1 C)] 98.2 F (36.8 C) (11/17 0401) Pulse Rate:  [66-120] 70 (11/17 0401) Cardiac Rhythm: Normal sinus rhythm (11/16 1902) Resp:  [17-20] 17 (11/17 0401) BP: (104-133)/(52-86) 133/63 (11/17 0401) SpO2:  [96 %-100 %] 98 % (11/17 0401) Weight:  [95.3 kg] 95.3 kg (11/17 0401)  Pre op weight  92.5 kg Current Weight  03/11/21 95.3 kg      Intake/Output from previous day: 11/16 0701 - 11/17 0700 In: 600 [P.O.:600] Out: -    Physical Exam:  Cardiovascular: RRR, no murmur Pulmonary: Clear to auscultation on the right and slightly diminished left base Abdomen: Soft, non tender, bowel sounds present. Extremities: Mild bilateral lower extremity edema. Ecchymosis bilateral thighs. Wounds: Clean and dry.  No erythema or signs of infection.  Lab Results: CBC: Recent Labs    03/09/21 0140  WBC 9.6  HGB 9.2*  HCT 27.7*  PLT 195   BMET:  Recent Labs    03/10/21 0151 03/11/21 0125  NA 131* 134*  K 3.9 4.5  CL 97* 100  CO2 25 26  GLUCOSE 99 109*  BUN 29* 38*  CREATININE 1.40* 1.67*  CALCIUM 8.5* 8.7*    PT/INR:  Lab Results  Component Value Date   INR 1.4 (H) 03/04/2021   INR 1.6 (H) 03/03/2021   INR 1.8 (H) 03/03/2021   ABG:  INR: Will add last result for  INR, ABG once components are confirmed Will add last 4 CBG results once components are confirmed  Assessment/Plan:  1. CV - PAF and has had a few runs of a fib with RVR recently. On Amiodarone 200 mg bid, Lopressor 50 mg bid, and Rivaroxaban 20 mg at supper. Will increase Amiodarone to 400 mg bid and then decrease at discharge. 2.  Pulmonary - On room air. Encourage incentive spirometer. .  Expected post op acute blood loss anemia - Last H and H stable at 9.2 and 27.7 4. CBGs 145/146/99. Pre op HGA1C 6.2. No prior history of diabetes. Likely borderline. On Prednisone but glucose not that high. Stop accu checks and SS PRN 5. History of CKD-Creatinine increased from 1.4 to 1.67. Creatinine upon admission was 1.25. Not on ACE or Lasix this am. Re check in am 6. History of myasthenia gravis-on Prednisone.  7. As discussed with Dr. Cliffton Asters, discharge in am  Edward Abruzzo M ZimmermanPA-C 03/11/2021,7:41 AM

## 2021-03-11 NOTE — Progress Notes (Signed)
CARDIAC REHAB PHASE I   PRE:  Rate/Rhythm: 76 SR  MODE:  Ambulation: 320 ft   POST:  Rate/Rhythm: 135 Afib with freq PVCs  BP:  Sitting: 113/84    SaO2: 95 RA   Pt in SR, ok to walk per RN. Pt ambulated 378ft in hallway assist of one with front wheel walker. C/o some SOB and leg weakness. Pt back in afib with frequent PVCs ranging from 2-10 beat runs. Pt helped back into bed. Encouraged ambulation later as able. Will continue to follow.  [phone] Reynold Bowen, RN BSN 03/11/2021 2:48 PM

## 2021-03-11 NOTE — Progress Notes (Signed)
Mobility Specialist: Progress Note   03/11/21 1628  Mobility  Activity Ambulated in hall  Level of Assistance Standby assist, set-up cues, supervision of patient - no hands on  Assistive Device Front wheel walker  Distance Ambulated (ft) 400 ft  Mobility Ambulated with assistance in hallway  Mobility Response Tolerated well  Mobility performed by Mobility specialist  $Mobility charge 1 Mobility   During Mobility: 139 HR  Pt had no c/o during ambulation. Pt to BR and then back to bed with NT present in the room.   [facility] Ki Corbo Mobility Specialist Mobility Specialist Phone: [phone]

## 2021-03-12 LAB — BASIC METABOLIC PANEL
Anion gap: 8 (ref 5–15)
BUN: 33 mg/dL — ABNORMAL HIGH (ref 8–23)
CO2: 25 mmol/L (ref 22–32)
Calcium: 8.7 mg/dL — ABNORMAL LOW (ref 8.9–10.3)
Chloride: 102 mmol/L (ref 98–111)
Creatinine, Ser: 1.61 mg/dL — ABNORMAL HIGH (ref 0.61–1.24)
GFR, Estimated: 45 mL/min — ABNORMAL LOW (ref >60)
Glucose, Bld: 104 mg/dL — ABNORMAL HIGH (ref 70–99)
Potassium: 4.3 mmol/L (ref 3.5–5.1)
Sodium: 135 mmol/L (ref 135–145)

## 2021-03-12 LAB — PHOSPHORUS: Phosphorus: 3.5 mg/dL (ref 2.5–4.6)

## 2021-03-12 LAB — MAGNESIUM: Magnesium: 2.1 mg/dL (ref 1.7–2.4)

## 2021-03-12 LAB — GLUCOSE, CAPILLARY: Glucose-Capillary: 114 mg/dL — ABNORMAL HIGH (ref 70–99)

## 2021-03-12 MED ORDER — METOPROLOL TARTRATE 25 MG PO TABS: 25.0000 mg | ORAL_TABLET | Freq: Two times a day (BID) | ORAL | 3 refills | Status: DC

## 2021-03-12 MED ORDER — TRAMADOL HCL 50 MG PO TABS: 50.0000 mg | ORAL_TABLET | ORAL | 0 refills | Status: DC | PRN

## 2021-03-12 MED ORDER — AMIODARONE HCL 200 MG PO TABS: 400.0000 mg | ORAL_TABLET | Freq: Two times a day (BID) | ORAL | 1 refills | Status: DC

## 2021-03-12 NOTE — Care Management Important Message (Signed)
Important Message  Patient Details  Name: Edward [name] MRN: [id number] Date of Birth: 07/11/1945   Medicare Important Message Given:  Yes     Renie Ora 03/12/2021, 11:10 AM

## 2021-03-12 NOTE — Progress Notes (Addendum)
   03/12/21 0428  Vitals  BP 119/77  MAP (mmHg) 84  BP Location Left Arm  BP Method Automatic  Patient Position (if appropriate) Lying  Pulse Rate (!) 103  ECG Heart Rate (!) 102  Resp 16  Level of Consciousness  Level of Consciousness Alert  MEWS COLOR  MEWS Score Color Green  Oxygen Therapy  SpO2 95 %  O2 Device Room Air  Pain Assessment  Pain Scale 0-10  Pain Score 0  MEWS Score  MEWS Temp 0  MEWS Systolic 0  MEWS Pulse 1  MEWS RR 0  MEWS LOC 0  MEWS Score 1  V.S. while in At. Fib.

## 2021-03-12 NOTE — Progress Notes (Signed)
Mobility Specialist: Progress Note   03/12/21 1022  Mobility  Activity Ambulated in hall  Level of Assistance Independent after set-up  Assistive Device Front wheel walker  Distance Ambulated (ft) 400 ft  Mobility Ambulated with assistance in hallway  Mobility Response Tolerated well  Mobility performed by Mobility specialist  $Mobility charge 1 Mobility   During Mobility: 140s HR Post-Mobility: 109 HR  Pt had no c/o during ambulation. Pt said he feels much better today and is ready to go home. Pt back to bed after walk with call bell at his side and wife present in the room.   [facility] Toshie Demelo Mobility Specialist Mobility Specialist Phone #1: [phone] Mobility Specialist Phone #2: [phone]

## 2021-03-12 NOTE — Progress Notes (Signed)
Patient trying to void and heart rate went to 130-140 At. Fib. With short run of V-Tach  vs WCT then back to At. Fib. Patient voiced no complaints. When patient  laid back in bed Heart Rate was 80 S.R. then back to At. Fib. 120 for few minutes then back to S.R. 72. B.P. higher in left arm . Been doing  B.P. on right arm this evening.

## 2021-03-12 NOTE — Progress Notes (Signed)
      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  No specific complaints.  Addressed questions with patient and his wife.  Objective: Vital signs in last 24 hours: Temp:  [97.8 F (36.6 C)-98.6 F (37 C)] 97.8 F (36.6 C) (11/18 0849) Pulse Rate:  [67-103] 103 (11/18 0428) Cardiac Rhythm: Normal sinus rhythm (11/18 0828) Resp:  [16-20] 18 (11/18 0849) BP: (99-142)/(61-77) 119/77 (11/18 0428) SpO2:  [94 %-96 %] 95 % (11/18 0428) Weight:  [95.5 kg] 95.5 kg (11/18 0356)  Intake/Output from previous day: 11/17 0701 - 11/18 0700 In: 240 [P.O.:240] Out: 850 [Urine:850] Intake/Output this shift: Total I/O In: -  Out: 200 [Urine:200]  General appearance: alert, cooperative, and no distress Heart: regular rate and rhythm Lungs: clear to auscultation bilaterally Abdomen: soft, non-tender; bowel sounds normal; no masses,  no organomegaly Extremities: extremities normal, atraumatic, no cyanosis or edema Wound: clean and dry  Lab Results: No results for input(s): WBC, HGB, HCT, PLT in the last 72 hours. BMET:  Recent Labs    03/11/21 0125 03/12/21 0241  NA 134* 135  K 4.5 4.3  CL 100 102  CO2 26 25  GLUCOSE 109* 104*  BUN 38* 33*  CREATININE 1.67* 1.61*  CALCIUM 8.7* 8.7*    PT/INR: No results for input(s): LABPROT, INR in the last 72 hours. 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 Cliffton Asters will d/c home today.   LOS: 9 days    Lowella Dandy, PA-C 03/12/2021

## 2021-03-12 NOTE — Progress Notes (Signed)
Nursing DC note  Patient alert and oriented. Both patient and wife verabalized understanding of dc instructions.ccmd made aware of dc order. All belongings and dc documents given to patient. Son is coming to take patient home. Will contact volunteer to take patient to the main front entrance.

## 2021-03-12 NOTE — Progress Notes (Signed)
CARDIAC REHAB PHASE I   Pt just returned from ambulation with mobility tech. Pt very excited to d/c today. Reinforced d/c education with pt and wife. Stressed importance of sternal precautions, IS use and ambulation. Reviewed site care. Questions and concerns addressed. Will refer to CRP II [place].   [id number] Reynold Bowen, RN BSN 03/12/2021 10:34 AM'

## 2021-03-14 NOTE — Progress Notes (Signed)
Cardiology Office Note:    Date:  03/17/2021   ID:  Edward [name], DOB Apr 16, 1946, MRN [id number]  PCP:  Farris Has, MD  Cardiologist:  None  Electrophysiologist:  Lanier Prude, MD   Referring MD: Farris Has, MD   No chief complaint on file.     History of Present Illness:    Edward [name] is a 75 y.o. male with a hx of aortic stenosis, hypertension, hyperlipidemia, myasthenia gravis, OSA, DVT who presents for follow-up.  He was referred by Dr. Kateri Plummer for evaluation of dyspnea and chest pain, initially seen on 09/29/2020.  Echocardiogram 07/27/2017 showed LVEF 60 to 65%, grade 1 diastolic dysfunction, functionally bicuspid aortic valve with mild to moderate aortic stenosis and mild aortic regurgitation, mild MR, mild TR. Echocardiogram on 10/14/2020 showed normal biventricular function, mild LVH, mild to moderate aortic stenosis, mild aortic regurgitation, mild mitral regurgitation.  Lexiscan Myoview on 11/05/2020 showed fixed inferior defect likely representing artifact, no ischemia, EF 52%.  Calcium score in 11/30/2020 was 674 (72nd percentile).  Continued to have chest pain and underwent cath on 02/11/2021 which showed severe multivessel CAD, moderate AS, normal filling pressures.  Underwent CABG x4 (LIMA-LAD, SVG-PDA, SVG-OM, SVG-D2 Y graft off OM), aortic valve replacement with 25 mm Inspiris Resilia on 03/03/2021.  Postop course was complicated by A. fib which was managed with amiodarone and beta-blocker.  Also developed thrombocytopenia down to 62 per recovery without intervention.    Since discharge from the hospital, he reports he is doing okay.  States that he feels very tired and is having trouble sleeping.  He has lost 15 pounds.  Reports chest soreness.  Denies any dyspnea, syncope, lower extremity edema.  Reports brief lightheadedness.  Denies any bleeding issues on Xarelto.   Wt Readings from Last 3 Encounters:  03/17/21 198 lb 12.8 oz (90.2 kg)  03/12/21 210 lb 8.6 oz (95.5  kg)  03/01/21 212 lb 8 oz (96.4 kg)      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 by echo 07/2017   Chronic kidney disease    kidney stones   Colon polyps    Coronary artery disease 2022   Diplopia 10/15/2015   Diverticulosis    DVT (deep venous thrombosis) (HCC)    2018, 2019   Erectile dysfunction    GERD (gastroesophageal reflux disease)    with Barrett's esophagus   Gout    Hearing loss    hearing aides   Heart murmur    pt says he has had it since he was a child, no issues ever mentioned   HTN (hypertension)    Hypercholesteremia    Migraine headache    Myasthenia gravis (HCC)    Obesity    OSA (obstructive sleep apnea)    AHI 43/hr now on CPAP at 10cm H2O   Peripheral neuropathy 01/14/2019   Right foot drop 12/03/2018    Past Surgical History:  Procedure Laterality Date   AORTIC VALVE REPLACEMENT N/A 03/03/2021   Procedure: AORTIC VALVE REPLACEMENT (AVR) WITH BIOPROSTHETIC VALVE, Inpiris Resila Aortic Valve 57mm;  Surgeon: Corliss Skains, MD;  Location: [facility] OR;  Service: Open Heart Surgery;  Laterality: N/A;   CORONARY ARTERY BYPASS GRAFT N/A 03/03/2021   Procedure: CORONARY ARTERY BYPASS GRAFTING (CABG) x four on pump, using left internal mammary artery, left and right endoscopic greater saphenous veins  conduits;  Surgeon: Corliss Skains, MD;  Location: [facility] OR;  Service: Open Heart Surgery;  Laterality: N/A;   ENDOVEIN HARVEST OF GREATER SAPHENOUS VEIN Bilateral 03/03/2021   Procedure: ENDOVEIN HARVEST OF GREATER SAPHENOUS VEIN;  Surgeon: Corliss Skains, MD;  Location: [facility] OR;  Service: Open Heart Surgery;  Laterality: Bilateral;   laser assisted uvuloplasty     MEDIASTINAL EXPLORATION N/A 03/03/2021   Procedure: MEDIASTINAL EXPLORATION;  Surgeon: Corliss Skains, MD;  Location: [facility] OR;  Service: Open Heart Surgery;  Laterality: N/A;    RIGHT/LEFT HEART CATH AND CORONARY ANGIOGRAPHY N/A 02/11/2021   Procedure: RIGHT/LEFT HEART CATH AND CORONARY ANGIOGRAPHY;  Surgeon: [place], Peter M, MD;  Location: [facility] INVASIVE CV LAB;  Service: Cardiovascular;  Laterality: N/A;   TEE WITHOUT CARDIOVERSION N/A 03/03/2021   Procedure: TRANSESOPHAGEAL ECHOCARDIOGRAM (TEE);  Surgeon: Corliss Skains, MD;  Location: [facility] OR;  Service: Open Heart Surgery;  Laterality: N/A;    Current Medications: Current Meds  Medication Sig   acetaminophen (TYLENOL) 500 MG tablet Take 1,000 mg by mouth every 6 (six) hours as needed for moderate pain or mild pain.   allopurinol (ZYLOPRIM) 300 MG tablet Take 300 mg by mouth daily.   amiodarone (PACERONE) 200 MG tablet Take 2 tablets (400 mg total) by mouth 2 (two) times daily. X 7 days, then decrease to 200 mg BID x 7 days, then to 200 mg daily   aspirin EC 81 MG tablet Take 81 mg by mouth daily. Swallow whole.   atorvastatin (LIPITOR) 80 MG tablet Take 1 tablet (80 mg total) by mouth daily.   Biotin 1000 MCG tablet Take 1,000 mcg by mouth daily.   cetirizine (ZYRTEC) 10 MG tablet Take 10 mg by mouth daily.   fish oil-omega-3 fatty acids 1000 MG capsule Take 1 g by mouth at bedtime.   metoprolol tartrate (LOPRESSOR) 25 MG tablet Take 1 tablet (25 mg total) by mouth 2 (two) times daily.   Multiple Vitamin (MULTIVITAMIN) capsule Take 1 capsule by mouth daily.   pantoprazole (PROTONIX) 40 MG tablet Take 40 mg by mouth daily.   predniSONE (DELTASONE) 1 MG tablet Take 1 tablet (1 mg total) by mouth daily. (Patient taking differently: Take 1 mg by mouth daily. Take with 5 mg for a total of 6 mg daily)   predniSONE (DELTASONE) 5 MG tablet TAKE 1 TABLET BY MOUTH EVERY DAY (Patient taking differently: Take 5 mg by mouth daily with breakfast. Take with 1 mg for a total of 6 mg daily)   pyridostigmine (MESTINON) 60 MG tablet 1/2 tablet three times a day   traZODone (DESYREL) 50 MG tablet TAKE 1 TABLET BY MOUTH EVERYDAY AT  BEDTIME (Patient taking differently: Take 50 mg by mouth at bedtime as needed for sleep.)   XARELTO 20 MG TABS tablet Take 20 mg by mouth daily.     Allergies:   Patient has no known allergies.   Social History   Socioeconomic History   Marital status: Married    Spouse name: [name]   Number of children: 2   Years of education: [employer] education level: Not on file  Occupational History   Occupation: Retired  Tobacco Use   Smoking status: Never   Smokeless tobacco: Never  Vaping Use   Vaping Use: Never used  Substance and Sexual Activity   Alcohol use: Not Currently   Drug use: No   Sexual activity: Not on file  Other Topics Concern   Not on  file  Social History Narrative   Lives home w/ his wife   Right-handed    Drinks about 3 cups of caffeine per day   Social Determinants of Health   Financial Resource Strain: Not on file  Food Insecurity: Not on file  Transportation Needs: Not on file  Physical Activity: Not on file  Stress: Not on file  Social Connections: Not on file     Family History: The patient's family history includes Anemia in his brother; Cancer in his brother; Heart attack in his father; Heart disease in his brother and father; Hypertension in his brother, brother, mother, and sister.  ROS:   Please see the history of present illness.    All other systems reviewed and are negative.  EKGs/Labs/Other Studies Reviewed:    The following studies were reviewed today:  [place] LE Venous 08/01/2017: Final Interpretation:  Right: There is evidence of acute DVT in the Posterior Tibial veins.  Ultrasound is unable to distinguish whether obstruction in the Femoral  vein, and Popliteal vein is acute or chronic.   TTE 07/27/2017: - Left ventricle: The cavity size was normal. There was mild    concentric hypertrophy. Systolic function was normal. The    estimated ejection fraction was in the range of 60% to 65%. Wall    motion was normal; there were no  regional wall motion    abnormalities. Doppler parameters are consistent with abnormal    left ventricular relaxation (grade 1 diastolic dysfunction).  - Aortic valve: Functionally bicuspid; moderately thickened,    moderately calcified leaflets. There was mild to moderate    stenosis. There was mild regurgitation. Peak gradient (S): 25 mm    Hg. Valve area (VTI): 1.24 cm^2. Valve area (Vmax): 1.42 cm^2.    Valve area (Vmean): 1.37 cm^2.  - Mitral valve: There was mild regurgitation.  - Left atrium: The atrium was at the upper limits of normal in    size.  - Right ventricle: The cavity size was normal. Wall thickness was    normal. Systolic function was normal.  - Tricuspid valve: There was mild regurgitation.  - Pulmonary arteries: Systolic pressure was within the normal    range.   US Venous LE Doppler 06/27/2016: 1. Positive for acute to subacute deep venous thrombosis extending from the popliteal vein into the common femoral vein. The common femoral and femoral venous thrombus is nonocclusive while the popliteal venous thrombus is occlusive.  Echo 08/31/2015: - Left ventricle: The cavity size was normal. Wall thickness was    normal. Systolic function was vigorous. The estimated ejection    fraction was in the range of 65% to 70%. Wall motion was normal;    there were no regional wall motion abnormalities. Doppler    parameters are consistent with abnormal left ventricular    relaxation (grade 1 diastolic dysfunction).  - Aortic valve: There was mild regurgitation. Mean gradient (S): 9    mm Hg. Peak gradient (S): 15 mm Hg. Valve area (VTI): 1.98 cm^2.    Valve area (Vmax): 2.07 cm^2. Valve area (Vmean): 1.87 cm^2.  - Pulmonary arteries: Systolic pressure was mildly increased. PA    peak pressure: 32 mm Hg (S).   EKG:  03/17/21: Sinus bradycardia, rate 52, less than 1 mm ST depressions in leads V3-6, nonspecific T wave flattening 12/08/20: Sinus rhythm, PVCs, rate 71, Q waves in  lead III 11/09/2020: no ekg ordered today.  09/29/2020: NSR, PACs, PVCs, nonspecific T wave flattening, rate 84 bpm  Recent Labs: 03/01/2021: ALT 25 03/09/2021: Hemoglobin 9.2; Platelets 195 03/12/2021: BUN 33; Creatinine, Ser 1.61; Magnesium 2.1; Potassium 4.3; Sodium 135  Recent Lipid Panel    Component Value Date/Time   CHOL 163 11/09/2020 1028   TRIG 170 (H) 11/09/2020 1028   HDL 49 11/09/2020 1028   CHOLHDL 3.3 11/09/2020 1028   LDLCALC 85 11/09/2020 1028    Physical Exam:    VS:  BP 114/66 (BP Location: Left Arm, Patient Position: Sitting, Cuff Size: Large)   Pulse (!) 52   Ht 5' 8.5" (1.74 m)   Wt 198 lb 12.8 oz (90.2 kg)   SpO2 98%   BMI 29.79 kg/m     Wt Readings from Last 3 Encounters:  03/17/21 198 lb 12.8 oz (90.2 kg)  03/12/21 210 lb 8.6 oz (95.5 kg)  03/01/21 212 lb 8 oz (96.4 kg)     GEN: Well nourished, well developed in no acute distress HEENT: Normal NECK: No JVD; No carotid bruits CARDIAC: RRR, 2/6 systolic murmur RESPIRATORY:  Clear to auscultation without rales, wheezing or rhonchi  ABDOMEN: Soft, non-tender, non-distended MUSCULOSKELETAL:  Trace RLE edema.  Incisions c/d/i SKIN: Warm and dry NEUROLOGIC:  Alert and oriented x 3 PSYCHIATRIC:  Normal affect   ASSESSMENT:    1. Coronary artery disease involving native coronary artery of native heart without angina pectoris   2. Essential hypertension   3. S/P CABG (coronary artery bypass graft)   4. S/P AVR   5. PAF (paroxysmal atrial fibrillation) (HCC)      PLAN:    CAD: Reported chest pain.  Lexiscan Myoview on 11/05/2020 showed fixed inferior defect likely representing artifact, no ischemia, EF 52%.  Echocardiogram on 10/14/2020 showed normal biventricular function, mild LVH, mild to moderate aortic stenosis, mild aortic regurgitation, mild mitral regurgitation.  Calcium score in 11/30/2020 was 674 (72nd percentile).  Continued to have chest pain and underwent cath on 02/11/2021 which showed  severe multivessel CAD, moderate AS, normal filling pressures.  Underwent CABG x4 (LIMA-LAD, SVG-PDA, SVG-OM, SVG-D2 Y graft off OM) on 03/03/21 -Continue aspirin 81 mg daily -Continue atorvastatin 80 mg daily -Continue metoprolol 25 mg twice daily  Aortic stenosis: Mild to moderate on echo 07/2017.  Echocardiogram on 10/14/2020 showed normal biventricular function, mild LVH, mild to moderate aortic stenosis, mild aortic regurgitation, mild mitral regurgitation.  Cath 02/11/2021 suggested moderate aortic stenosis.  Underwent aortic valve replacement with 25 mm Inspiris Resilia on 03/03/2021 at time of CABG  Hyperlipidemia: On atorvastatin 20 mg daily, LDL 94 on 04/15/2020.  Calcium score in 11/30/2020 was 674 (72nd percentile).  Atorvastatin dose increased to 80 mg at time of CABG.  Hypertension: On amlodipine 5 mg daily and losartan 50 mg daily prior to CABG. switch to metoprolol 25 mg twice daily following CABG.  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  Zio patch x3 days 12/16/2020 showed 95 episodes of NSVT with longest lasting 12 beats and frequent PVCs (14% of beats).  Suspect related to ischemia in setting of severe multivessel disease.  Monitor for improvement status post CABG  Paroxysmal atrial fibrillation: Occurred in postoperative setting after CABG.  Loading with amiodarone.  Continued on Xarelto.  Currently in sinus rhythm.  Finish current prescription for amiodarone, then discontinue if no further A. fib  RTC in 2 months   Medication Adjustments/Labs and Tests Ordered: Current medicines are reviewed at length with the patient today.  Concerns  regarding medicines are outlined above.  Orders Placed This Encounter  Procedures   Comprehensive Metabolic Panel (CMET)   CBC w/Diff/Platelet   EKG 12-Lead     No orders of the defined types were placed in this encounter.    Patient Instructions   Medication Instructions:  Continue same medications *If you need a refill on your cardiac medications before your next appointment, please call your pharmacy*   Lab Work: Cmet,cbc today   Testing/Procedures: None ordered   Follow-Up: At [facility], you and your health needs are our priority.  As part of our continuing mission to provide you with exceptional heart care, we have created designated Provider Care Teams.  These Care Teams include your primary Cardiologist (physician) and Advanced Practice Providers (APPs -  Physician Assistants and Nurse Practitioners) who all work together to provide you with the care you need, when you need it.  We recommend signing up for the patient portal called "MyChart".  Sign up information is provided on this After Visit Summary.  MyChart is used to connect with patients for Virtual Visits (Telemedicine).  Patients are able to view lab/test results, encounter notes, upcoming appointments, etc.  Non-urgent messages can be sent to your provider as well.   To learn more about what you can do with MyChart, go to [email or web address].      Your next appointment:  Friday  05/07/21   at 10:40 am    The format for your next appointment: Office     Provider:  Dr.Jaremy Nosal       Signed, Little Ishikawa, MD  03/17/2021 10:10 AM     [facility]

## 2021-03-17 ENCOUNTER — Other Ambulatory Visit: Payer: Self-pay

## 2021-03-17 ENCOUNTER — Encounter: Payer: Self-pay | Admitting: Cardiology

## 2021-03-17 ENCOUNTER — Ambulatory Visit (INDEPENDENT_AMBULATORY_CARE_PROVIDER_SITE_OTHER): Payer: Medicare PPO | Admitting: Cardiology

## 2021-03-17 VITALS — BP 114/66 | HR 52 | Ht 68.5 in | Wt 198.8 lb

## 2021-03-17 DIAGNOSIS — Q231 Congenital insufficiency of aortic valve: Secondary | ICD-10-CM | POA: Diagnosis not present

## 2021-03-17 DIAGNOSIS — Z952 Presence of prosthetic heart valve: Secondary | ICD-10-CM | POA: Diagnosis not present

## 2021-03-17 DIAGNOSIS — Q230 Congenital stenosis of aortic valve: Secondary | ICD-10-CM | POA: Diagnosis not present

## 2021-03-17 DIAGNOSIS — Z951 Presence of aortocoronary bypass graft: Secondary | ICD-10-CM

## 2021-03-17 DIAGNOSIS — I480 Paroxysmal atrial fibrillation: Secondary | ICD-10-CM | POA: Diagnosis not present

## 2021-03-17 DIAGNOSIS — I10 Essential (primary) hypertension: Secondary | ICD-10-CM

## 2021-03-17 DIAGNOSIS — I2510 Atherosclerotic heart disease of native coronary artery without angina pectoris: Secondary | ICD-10-CM

## 2021-03-17 LAB — COMPREHENSIVE METABOLIC PANEL
ALT: 37 IU/L (ref 0–44)
AST: 37 IU/L (ref 0–40)
Albumin/Globulin Ratio: 1.5 (ref 1.2–2.2)
Albumin: 4.1 g/dL (ref 3.7–4.7)
Alkaline Phosphatase: 82 IU/L (ref 44–121)
BUN/Creatinine Ratio: 19 (ref 10–24)
BUN: 28 mg/dL — ABNORMAL HIGH (ref 8–27)
Bilirubin Total: 0.7 mg/dL (ref 0.0–1.2)
CO2: 23 mmol/L (ref 20–29)
Calcium: 8.8 mg/dL (ref 8.6–10.2)
Chloride: 102 mmol/L (ref 96–106)
Creatinine, Ser: 1.47 mg/dL — ABNORMAL HIGH (ref 0.76–1.27)
Globulin, Total: 2.8 g/dL (ref 1.5–4.5)
Glucose: 103 mg/dL — ABNORMAL HIGH (ref 70–99)
Potassium: 5.3 mmol/L — ABNORMAL HIGH (ref 3.5–5.2)
Sodium: 138 mmol/L (ref 134–144)
Total Protein: 6.9 g/dL (ref 6.0–8.5)
eGFR: 50 mL/min/{1.73_m2} — ABNORMAL LOW (ref >59)

## 2021-03-17 LAB — CBC WITH DIFFERENTIAL/PLATELET
Basophils Absolute: 0.2 10*3/uL (ref 0.0–0.2)
Basos: 1 %
EOS (ABSOLUTE): 0.6 10*3/uL — ABNORMAL HIGH (ref 0.0–0.4)
Eos: 4 %
Hematocrit: 32.5 % — ABNORMAL LOW (ref 37.5–51.0)
Hemoglobin: 10.8 g/dL — ABNORMAL LOW (ref 13.0–17.7)
Immature Grans (Abs): 0.1 10*3/uL (ref 0.0–0.1)
Immature Granulocytes: 1 %
Lymphocytes Absolute: 0.8 10*3/uL (ref 0.7–3.1)
Lymphs: 5 %
MCH: 30.1 pg (ref 26.6–33.0)
MCHC: 33.2 g/dL (ref 31.5–35.7)
MCV: 91 fL (ref 79–97)
Monocytes Absolute: 0.8 10*3/uL (ref 0.1–0.9)
Monocytes: 5 %
Neutrophils Absolute: 13.9 10*3/uL — ABNORMAL HIGH (ref 1.4–7.0)
Neutrophils: 84 %
Platelets: 543 10*3/uL — ABNORMAL HIGH (ref 150–450)
RBC: 3.59 x10E6/uL — ABNORMAL LOW (ref 4.14–5.80)
RDW: 13.9 % (ref 11.6–15.4)
WBC: 16.5 10*3/uL — ABNORMAL HIGH (ref 3.4–10.8)

## 2021-03-17 NOTE — Patient Instructions (Signed)
Medication Instructions:  Continue same medications *If you need a refill on your cardiac medications before your next appointment, please call your pharmacy*   Lab Work: Cmet,cbc today   Testing/Procedures: None ordered   Follow-Up: At [facility], you and your health needs are our priority.  As part of our continuing mission to provide you with exceptional heart care, we have created designated Provider Care Teams.  These Care Teams include your primary Cardiologist (physician) and Advanced Practice Providers (APPs -  Physician Assistants and Nurse Practitioners) who all work together to provide you with the care you need, when you need it.  We recommend signing up for the patient portal called "MyChart".  Sign up information is provided on this After Visit Summary.  MyChart is used to connect with patients for Virtual Visits (Telemedicine).  Patients are able to view lab/test results, encounter notes, upcoming appointments, etc.  Non-urgent messages can be sent to your provider as well.   To learn more about what you can do with MyChart, go to [email or web address].      Your next appointment:  Friday  05/07/21   at 10:40 am    The format for your next appointment: Office     Provider:  Dr.Schumann

## 2021-03-22 ENCOUNTER — Other Ambulatory Visit: Payer: Self-pay | Admitting: *Deleted

## 2021-03-22 DIAGNOSIS — I10 Essential (primary) hypertension: Secondary | ICD-10-CM

## 2021-03-22 DIAGNOSIS — E875 Hyperkalemia: Secondary | ICD-10-CM

## 2021-03-22 DIAGNOSIS — Z79899 Other long term (current) drug therapy: Secondary | ICD-10-CM

## 2021-03-23 ENCOUNTER — Other Ambulatory Visit: Payer: Self-pay | Admitting: *Deleted

## 2021-03-23 DIAGNOSIS — Z79899 Other long term (current) drug therapy: Secondary | ICD-10-CM

## 2021-03-23 DIAGNOSIS — I10 Essential (primary) hypertension: Secondary | ICD-10-CM

## 2021-03-23 DIAGNOSIS — E785 Hyperlipidemia, unspecified: Secondary | ICD-10-CM

## 2021-03-23 DIAGNOSIS — E875 Hyperkalemia: Secondary | ICD-10-CM

## 2021-03-26 ENCOUNTER — Other Ambulatory Visit: Payer: Self-pay

## 2021-03-26 ENCOUNTER — Ambulatory Visit (INDEPENDENT_AMBULATORY_CARE_PROVIDER_SITE_OTHER): Payer: Self-pay | Admitting: Thoracic Surgery (Cardiothoracic Vascular Surgery)

## 2021-03-26 DIAGNOSIS — I350 Nonrheumatic aortic (valve) stenosis: Secondary | ICD-10-CM

## 2021-03-26 DIAGNOSIS — Z951 Presence of aortocoronary bypass graft: Secondary | ICD-10-CM

## 2021-03-26 NOTE — Progress Notes (Signed)
     [address]       [place] [address]             [phone]       Patient: Home Provider: Office Consent for Telemedicine visit obtained.  Today's visit was completed via a real-time telehealth (see specific modality noted below). The patient/authorized person provided oral consent at the time of the visit to engage in a telemedicine encounter with the present provider at [facility]. The patient/authorized person was informed of the potential benefits, limitations, and risks of telemedicine. The patient/authorized person expressed understanding that the laws that protect confidentiality also apply to telemedicine. The patient/authorized person acknowledged understanding that telemedicine does not provide emergency services and that he or she would need to call 911 or proceed to the nearest hospital for help if such a need arose.   Total time spent in the clinical discussion 10 minutes.  Telehealth Modality: Phone visit (audio only)  I had a telephone visit with  [name] who is s/p AVR CABG.  He also has a history of myasthenia gravis and has been on steroids.  He states that his incision is healing well with concern for flexion.  Overall doing well.   Pain is minimal.  Ambulating well. Vitals have been stable.  [name] will see [place] back in 1 month with a chest x-ray for cardiac rehab clearance.  Zayveon Raschke Keane Scrape

## 2021-04-20 ENCOUNTER — Other Ambulatory Visit: Payer: Self-pay | Admitting: Thoracic Surgery (Cardiothoracic Vascular Surgery)

## 2021-04-20 DIAGNOSIS — Z951 Presence of aortocoronary bypass graft: Secondary | ICD-10-CM

## 2021-04-22 ENCOUNTER — Other Ambulatory Visit: Payer: Self-pay

## 2021-04-22 DIAGNOSIS — E875 Hyperkalemia: Secondary | ICD-10-CM | POA: Diagnosis not present

## 2021-04-22 DIAGNOSIS — E785 Hyperlipidemia, unspecified: Secondary | ICD-10-CM | POA: Diagnosis not present

## 2021-04-22 DIAGNOSIS — Z951 Presence of aortocoronary bypass graft: Secondary | ICD-10-CM

## 2021-04-22 DIAGNOSIS — Z79899 Other long term (current) drug therapy: Secondary | ICD-10-CM

## 2021-04-22 DIAGNOSIS — I10 Essential (primary) hypertension: Secondary | ICD-10-CM | POA: Diagnosis not present

## 2021-04-22 LAB — BASIC METABOLIC PANEL
BUN/Creatinine Ratio: 17 (ref 10–24)
BUN: 23 mg/dL (ref 8–27)
CO2: 25 mmol/L (ref 20–29)
Calcium: 9.1 mg/dL (ref 8.6–10.2)
Chloride: 104 mmol/L (ref 96–106)
Creatinine, Ser: 1.33 mg/dL — ABNORMAL HIGH (ref 0.76–1.27)
Glucose: 93 mg/dL (ref 70–99)
Potassium: 4.5 mmol/L (ref 3.5–5.2)
Sodium: 141 mmol/L (ref 134–144)
eGFR: 56 mL/min/{1.73_m2} — ABNORMAL LOW (ref >59)

## 2021-04-22 LAB — LIPID PANEL
Chol/HDL Ratio: 3.3 ratio (ref 0.0–5.0)
Cholesterol, Total: 128 mg/dL (ref 100–199)
HDL: 39 mg/dL — ABNORMAL LOW (ref >39)
LDL Chol Calc (NIH): 61 mg/dL (ref 0–99)
Triglycerides: 165 mg/dL — ABNORMAL HIGH (ref 0–149)
VLDL Cholesterol Cal: 28 mg/dL (ref 5–40)

## 2021-04-29 ENCOUNTER — Other Ambulatory Visit: Payer: Self-pay

## 2021-04-29 ENCOUNTER — Other Ambulatory Visit: Payer: Self-pay | Admitting: *Deleted

## 2021-04-29 ENCOUNTER — Ambulatory Visit (INDEPENDENT_AMBULATORY_CARE_PROVIDER_SITE_OTHER): Payer: Self-pay | Admitting: Physician Assistant

## 2021-04-29 ENCOUNTER — Ambulatory Visit
Admission: RE | Admit: 2021-04-29 | Discharge: 2021-04-29 | Disposition: A | Discharge status: Home / Self Care | Payer: Medicare PPO | Source: Ambulatory Visit | Attending: Thoracic Surgery (Cardiothoracic Vascular Surgery) | Admitting: Thoracic Surgery (Cardiothoracic Vascular Surgery)

## 2021-04-29 VITALS — BP 114/54 | HR 55 | Ht 69.0 in | Wt 201.0 lb

## 2021-04-29 DIAGNOSIS — Z952 Presence of prosthetic heart valve: Secondary | ICD-10-CM

## 2021-04-29 DIAGNOSIS — Z951 Presence of aortocoronary bypass graft: Secondary | ICD-10-CM

## 2021-04-29 DIAGNOSIS — Z9889 Other specified postprocedural states: Secondary | ICD-10-CM | POA: Diagnosis not present

## 2021-04-29 NOTE — Patient Instructions (Addendum)
Continue to observe sternal precautions for another 4 weeks and may resume activities without restriction.  Okay to participate in cardiac rehab.  May resume driving.  Okay to discontinue the amiodarone when recurrent prescription is complete.

## 2021-04-29 NOTE — Progress Notes (Signed)
[address]       [place] [address]             [phone]       HPI: [name] is a 76 year old male with a history of bicuspid aortic valve with moderate aortic stenosis, chronic kidney disease, dyslipidemia, and myasthenia gravis with foot drop. Patient returns for routine postoperative follow-up having undergone aortic valve replacement with a 25 mm InspirEase Resilia bioprosthetic valve and CABG x4 by Dr. Cliffton Asters on 03/03/2018.  The patient's early postoperative recovery while in the hospital was notable for mild renal insufficiency and paroxysmal atrial fibrillation.  He was discharged in stable sinus rhythm on amiodarone.  He was already taking rivaroxaban prior to admission and this was resumed as well.  Since hospital discharge the patient reports continued to progress.  He has had no chest pain, shortness of breath, or palpitations.  He walks on a regular basis and is gradually increasing the distance.   Current Outpatient Medications  Medication Sig Dispense Refill   acetaminophen (TYLENOL) 500 MG tablet Take 1,000 mg by mouth every 6 (six) hours as needed for moderate pain or mild pain.     allopurinol (ZYLOPRIM) 300 MG tablet Take 300 mg by mouth daily.     amiodarone (PACERONE) 200 MG tablet Take 2 tablets (400 mg total) by mouth 2 (two) times daily. X 7 days, then decrease to 200 mg BID x 7 days, then to 200 mg daily 90 tablet 1   aspirin EC 81 MG tablet Take 81 mg by mouth daily. Swallow whole.     atorvastatin (LIPITOR) 80 MG tablet Take 1 tablet (80 mg total) by mouth daily. 30 tablet 11   Biotin 1000 MCG tablet Take 1,000 mcg by mouth daily.     cetirizine (ZYRTEC) 10 MG tablet Take 10 mg by mouth daily.     fish oil-omega-3 fatty acids 1000 MG capsule Take 1 g by mouth at bedtime.     lidocaine (LIDODERM) 5 % Place 1 patch onto the skin daily. Remove & Discard patch within 12 hours or as directed by MD (Patient not taking: Reported on  03/17/2021) 30 patch 1   metoprolol tartrate (LOPRESSOR) 25 MG tablet Take 1 tablet (25 mg total) by mouth 2 (two) times daily. 60 tablet 3   Multiple Vitamin (MULTIVITAMIN) capsule Take 1 capsule by mouth daily.     pantoprazole (PROTONIX) 40 MG tablet Take 40 mg by mouth daily.     predniSONE (DELTASONE) 1 MG tablet Take 1 tablet (1 mg total) by mouth daily. (Patient taking differently: Take 1 mg by mouth daily. Take with 5 mg for a total of 6 mg daily) 90 tablet 2   predniSONE (DELTASONE) 5 MG tablet TAKE 1 TABLET BY MOUTH EVERY DAY (Patient taking differently: Take 5 mg by mouth daily with breakfast. Take with 1 mg for a total of 6 mg daily) 90 tablet 2   pyridostigmine (MESTINON) 60 MG tablet 1/2 tablet three times a day 135 tablet 3   traMADol (ULTRAM) 50 MG tablet Take 1 tablet (50 mg total) by mouth every 4 (four) hours as needed for moderate pain. (Patient not taking: Reported on 03/17/2021) 30 tablet 0   traZODone (DESYREL) 50 MG tablet TAKE 1 TABLET BY MOUTH EVERYDAY AT BEDTIME (Patient taking differently: Take 50 mg by mouth at bedtime as needed for sleep.) 90 tablet 2   XARELTO 20 MG TABS tablet Take 20 mg  by mouth daily.     No current facility-administered medications for this visit.    Physical Exam Vital signs BP 114/54 Pulse 55 Respirations 20 SPO2 93% on room air  General: Well-developed 76 year old male in no acute distress.  He walked in the office today with no difficulty, accompanied by his wife. Heart: Regular rhythm.  Heart rate 55-60 Chest: Breath sounds are clear to auscultation.  Chest x-ray is reviewed and shows no unexpected changes.  No pleural effusion. Extremities: All warm and well-perfused.  No peripheral edema.  The bilateral EVH incisions in his lower extremities are healing with no sign of complication.   Diagnostic Tests: CLINICAL DATA:  Postop CABG 8 weeks.   EXAM: CHEST - 2 VIEW   COMPARISON:  03/08/2021   FINDINGS: Postop CABG and aortic  valve replacement.   Heart size and vascularity normal. Lungs clear. No infiltrate or effusion.   IMPRESSION: No active cardiopulmonary disease.     Electronically Signed   By: Marlan Palau M.D.   On: 04/29/2021 13:46    Impression / Plan:  [name] continues to make excellent progress following CABG x4 and aortic valve replacement for a bicuspid aortic valve with moderate stenosis.  He has had no further palpitations after having some paroxysmal A. fib in the hospital.  I told him he could stop taking the amiodarone when the current prescription is complete.  He may resume driving.  I asked him to continue to observe sternal precautions for another month then he may resume activities without limitation.  He is interested in beginning cardiac rehab program he has been in contact with him.  He was told there backed up" for 1 to 2 months.  He would like to begin using exercise equipment in the gym where he is a member.  I explained the importance of observing sternal precautions but otherwise I believe this would be okay at this point. Will schedule for routine follow-up echo.  [name] is scheduled to see his cardiologist in about a week.  We will follow-up on the echo but otherwise he does not need any scheduled follow-up with our practice.  We will be very happy to see him if any problems arise related to his surgery.   Leary Roca, PA-C [facility] [phone]

## 2021-04-30 ENCOUNTER — Ambulatory Visit (HOSPITAL_COMMUNITY): Payer: Medicare PPO | Attending: Cardiology

## 2021-04-30 DIAGNOSIS — Z952 Presence of prosthetic heart valve: Secondary | ICD-10-CM | POA: Insufficient documentation

## 2021-04-30 DIAGNOSIS — Z951 Presence of aortocoronary bypass graft: Secondary | ICD-10-CM | POA: Insufficient documentation

## 2021-04-30 LAB — ECHOCARDIOGRAM COMPLETE
AR max vel: 2.05 cm2
AV Area VTI: 2.08 cm2
AV Area mean vel: 1.94 cm2
AV Mean grad: 7 mmHg
AV Peak grad: 13.7 mmHg
Ao pk vel: 1.85 m/s
Area-P 1/2: 2.6 cm2
S' Lateral: 3.4 cm

## 2021-04-30 MED ORDER — PERFLUTREN LIPID MICROSPHERE
1.0000 mL | INTRAVENOUS | Status: AC | PRN
  Administered 2021-04-30: 3 mL via INTRAVENOUS

## 2021-05-02 NOTE — Progress Notes (Signed)
Cardiology Office Note:    Date:  05/07/2021   ID:  Edward [name], DOB 02/15/1946, MRN [id number]  PCP:  London Pepper, MD  Cardiologist:  Donato Heinz, MD  Electrophysiologist:  Vickie Epley, MD   Referring MD: London Pepper, MD   Chief Complaint  Patient presents with   Coronary Artery Disease     History of Present Illness:    Edward [name] is a 76 y.o. male with a hx of aortic stenosis, hypertension, hyperlipidemia, myasthenia gravis, OSA, DVT who presents for follow-up.  He was referred by Dr. Orland Mustard for evaluation of dyspnea and chest pain, initially seen on 09/29/2020.  Echocardiogram 07/27/2017 showed LVEF 60 to [id number], grade 1 diastolic dysfunction, functionally bicuspid aortic valve with mild to moderate aortic stenosis and mild aortic regurgitation, mild MR, mild TR. Echocardiogram on 10/14/2020 showed normal biventricular function, mild LVH, mild to moderate aortic stenosis, mild aortic regurgitation, mild mitral regurgitation.  Lexiscan Myoview on 11/05/2020 showed fixed inferior defect likely representing artifact, no ischemia, EF 52%.  Calcium score in 11/30/2020 was 674 (72nd percentile).  Continued to have chest pain and underwent cath on 02/11/2021 which showed severe multivessel CAD, moderate AS, normal filling pressures.  Underwent CABG x4 (LIMA-LAD, SVG-PDA, SVG-OM, SVG-D2 Y graft off OM), aortic valve replacement with 25 mm Inspiris Resilia on 03/03/2021.  Postop course was complicated by A. fib which was managed with amiodarone and beta-blocker.  Also developed thrombocytopenia down to 62 but recovery without intervention.  Echocardiogram on 04/30/21 showed EF 50 to 55%, mildly reduced RV function, normal functioning bioprosthetic aortic valve.  Since last clinic visit, he reports that he is doing well.  Denies any chest pain, dyspnea, lower extremity edema, or palpitations.  Walking daily for 15 minutes.  Reports some light lightheadedness with standing but denies any  syncope.   Wt Readings from Last 3 Encounters:  05/07/21 198 lb 3.2 oz (89.9 kg)  04/29/21 201 lb (91.2 kg)  03/17/21 198 lb 12.8 oz (90.2 kg)      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 by echo 07/2017   Chronic kidney disease    kidney stones   Colon polyps    Coronary artery disease 2022   Diplopia 10/15/2015   Diverticulosis    DVT (deep venous thrombosis) ([place])    2018, 2019   Erectile dysfunction    GERD (gastroesophageal reflux disease)    with Barrett's esophagus   Gout    Hearing loss    hearing aides   Heart murmur    pt says he has had it since he was a child, no issues ever mentioned   HTN (hypertension)    Hypercholesteremia    Migraine headache    Myasthenia gravis (HCC)    Obesity    OSA (obstructive sleep apnea)    AHI 43/hr now on CPAP at 10cm H2O   Peripheral neuropathy 01/14/2019   Right foot drop 12/03/2018    Past Surgical History:  Procedure Laterality Date   AORTIC VALVE REPLACEMENT N/A 03/03/2021   Procedure: AORTIC VALVE REPLACEMENT (AVR) WITH BIOPROSTHETIC VALVE, Inpiris Resila Aortic Valve 73mm;  Surgeon: Lajuana Matte, MD;  Location: [place];  Service: Open Heart Surgery;  Laterality: N/A;   CORONARY ARTERY BYPASS GRAFT N/A 03/03/2021   Procedure: CORONARY ARTERY BYPASS GRAFTING (CABG) x four on pump, using left  internal mammary artery, left and right endoscopic greater saphenous veins conduits;  Surgeon: Lajuana Matte, MD;  Location: [place];  Service: Open Heart Surgery;  Laterality: N/A;   ENDOVEIN HARVEST OF GREATER SAPHENOUS VEIN Bilateral 03/03/2021   Procedure: ENDOVEIN HARVEST OF GREATER SAPHENOUS VEIN;  Surgeon: Lajuana Matte, MD;  Location: [place];  Service: Open Heart Surgery;  Laterality: Bilateral;   laser assisted uvuloplasty     MEDIASTINAL EXPLORATION N/A 03/03/2021   Procedure: MEDIASTINAL  EXPLORATION;  Surgeon: Lajuana Matte, MD;  Location: [place];  Service: Open Heart Surgery;  Laterality: N/A;   RIGHT/LEFT HEART CATH AND CORONARY ANGIOGRAPHY N/A 02/11/2021   Procedure: RIGHT/LEFT HEART CATH AND CORONARY ANGIOGRAPHY;  Surgeon: Martinique, Peter M, MD;  Location: [facility];  Service: Cardiovascular;  Laterality: N/A;   TEE WITHOUT CARDIOVERSION N/A 03/03/2021   Procedure: TRANSESOPHAGEAL ECHOCARDIOGRAM (TEE);  Surgeon: Lajuana Matte, MD;  Location: [place];  Service: Open Heart Surgery;  Laterality: N/A;    Current Medications: Current Meds  Medication Sig   acetaminophen (TYLENOL) 500 MG tablet Take 1,000 mg by mouth every 6 (six) hours as needed for moderate pain or mild pain.   allopurinol (ZYLOPRIM) 300 MG tablet Take 300 mg by mouth daily.   aspirin EC 81 MG tablet Take 81 mg by mouth daily. Swallow whole.   atorvastatin (LIPITOR) 80 MG tablet Take 1 tablet (80 mg total) by mouth daily.   Biotin 1000 MCG tablet Take 1,000 mcg by mouth daily.   cetirizine (ZYRTEC) 10 MG tablet Take 10 mg by mouth daily.   fish oil-omega-3 fatty acids 1000 MG capsule Take 1 g by mouth at bedtime.   metoprolol succinate (TOPROL XL) 25 MG 24 hr tablet Take 1 tablet (25 mg total) by mouth daily.   Multiple Vitamin (MULTIVITAMIN) capsule Take 1 capsule by mouth daily.   pantoprazole (PROTONIX) 40 MG tablet Take 40 mg by mouth daily.   predniSONE (DELTASONE) 5 MG tablet TAKE 1 TABLET BY MOUTH EVERY DAY (Patient taking differently: Take 5 mg by mouth daily with breakfast. Take with 1 mg for a total of 6 mg daily)   pyridostigmine (MESTINON) 60 MG tablet 1/2 tablet three times a day   XARELTO 20 MG TABS tablet Take 20 mg by mouth daily.   [DISCONTINUED] metoprolol tartrate (LOPRESSOR) 25 MG tablet Take 1 tablet (25 mg total) by mouth 2 (two) times daily.     Allergies:   Patient has no known allergies.   Social History   Socioeconomic History   Marital status: Married     Spouse name: Vaughan Basta   Number of children: 2   Years of education: [employer] education level: Not on file  Occupational History   Occupation: Retired  Tobacco Use   Smoking status: Never   Smokeless tobacco: Never  Vaping Use   Vaping Use: Never used  Substance and Sexual Activity   Alcohol use: Not Currently   Drug use: No   Sexual activity: Not on file  Other Topics Concern   Not on file  Social History Narrative   Lives home w/ his wife   Right-handed    Drinks about 3 cups of caffeine per day   Social Determinants of Health   Financial Resource Strain: Not on file  Food Insecurity: Not on file  Transportation Needs: Not on file  Physical Activity: Not on file  Stress: Not on file  Social Connections: Not on file  Family History: The patient's family history includes Anemia in his brother; Cancer in his brother; Heart attack in his father; Heart disease in his brother and father; Hypertension in his brother, brother, mother, and sister.  ROS:   Please see the history of present illness.    All other systems reviewed and are negative.  EKGs/Labs/Other Studies Reviewed:    The following studies were reviewed today:  [place] LE Venous 08/01/2017: Final Interpretation:  Right: There is evidence of acute DVT in the Posterior Tibial veins.  Ultrasound is unable to distinguish whether obstruction in the Femoral  vein, and Popliteal vein is acute or chronic.   TTE 07/27/2017: - Left ventricle: The cavity size was normal. There was mild    concentric hypertrophy. Systolic function was normal. The    estimated ejection fraction was in the range of 60% to 65%. Wall    motion was normal; there were no regional wall motion    abnormalities. Doppler parameters are consistent with abnormal    left ventricular relaxation (grade 1 diastolic dysfunction).  - Aortic valve: Functionally bicuspid; moderately thickened,    moderately calcified leaflets. There was mild to  moderate    stenosis. There was mild regurgitation. Peak gradient (S): 25 mm    Hg. Valve area (VTI): 1.24 cm^2. Valve area (Vmax): 1.42 cm^2.    Valve area (Vmean): 1.37 cm^2.  - Mitral valve: There was mild regurgitation.  - Left atrium: The atrium was at the upper limits of normal in    size.  - Right ventricle: The cavity size was normal. Wall thickness was    normal. Systolic function was normal.  - Tricuspid valve: There was mild regurgitation.  - Pulmonary arteries: Systolic pressure was within the normal    range.   US Venous LE Doppler 06/27/2016: 1. Positive for acute to subacute deep venous thrombosis extending from the popliteal vein into the common femoral vein. The common femoral and femoral venous thrombus is nonocclusive while the popliteal venous thrombus is occlusive.  Echo 08/31/2015: - Left ventricle: The cavity size was normal. Wall thickness was    normal. Systolic function was vigorous. The estimated ejection    fraction was in the range of 65% to 70%. Wall motion was normal;    there were no regional wall motion abnormalities. Doppler    parameters are consistent with abnormal left ventricular    relaxation (grade 1 diastolic dysfunction).  - Aortic valve: There was mild regurgitation. Mean gradient (S): 9    mm Hg. Peak gradient (S): 15 mm Hg. Valve area (VTI): 1.98 cm^2.    Valve area (Vmax): 2.07 cm^2. Valve area (Vmean): 1.87 cm^2.  - Pulmonary arteries: Systolic pressure was mildly increased. PA    peak pressure: 32 mm Hg (S).   EKG:  03/17/21: Sinus bradycardia, rate 52, less than 1 mm ST depressions in leads V3-6, nonspecific T wave flattening 12/08/20: Sinus rhythm, PVCs, rate 71, Q waves in lead III 11/09/2020: no ekg ordered today.  09/29/2020: NSR, PACs, PVCs, nonspecific T wave flattening, rate 84 bpm   Recent Labs: 03/12/2021: Magnesium 2.1 03/17/2021: ALT 37; Hemoglobin 10.8; Platelets 543 04/22/2021: BUN 23; Creatinine, Ser 1.33; Potassium  4.5; Sodium 141  Recent Lipid Panel    Component Value Date/Time   CHOL 128 04/22/2021 0905   TRIG 165 (H) 04/22/2021 0905   HDL 39 (L) 04/22/2021 0905   CHOLHDL 3.3 04/22/2021 0905   LDLCALC 61 04/22/2021 0905    Physical Exam:  VS:  BP 135/78    Pulse (!) 57    Ht 5\' 9"  (1.753 m)    Wt 198 lb 3.2 oz (89.9 kg)    SpO2 98%    BMI 29.27 kg/m     Wt Readings from Last 3 Encounters:  05/07/21 198 lb 3.2 oz (89.9 kg)  04/29/21 201 lb (91.2 kg)  03/17/21 198 lb 12.8 oz (90.2 kg)     GEN: Well nourished, well developed in no acute distress HEENT: Normal NECK: No JVD; No carotid bruits CARDIAC: RRR, 2/6 systolic murmur RESPIRATORY:  Clear to auscultation without rales, wheezing or rhonchi  ABDOMEN: Soft, non-tender, non-distended MUSCULOSKELETAL:  Trace RLE edema.  Incisions c/d/i SKIN: Warm and dry NEUROLOGIC:  Alert and oriented x 3 PSYCHIATRIC:  Normal affect   ASSESSMENT:    1. Coronary artery disease involving native coronary artery of native heart without angina pectoris   2. S/P AVR   3. Hyperlipidemia, unspecified hyperlipidemia type   4. PVC's (premature ventricular contractions)   5. PAF (paroxysmal atrial fibrillation) (HCC)      PLAN:    CAD: Reported chest pain.  Lexiscan Myoview on 11/05/2020 showed fixed inferior defect likely representing artifact, no ischemia, EF 52%.  Echocardiogram on 10/14/2020 showed normal biventricular function, mild LVH, mild to moderate aortic stenosis, mild aortic regurgitation, mild mitral regurgitation.  Calcium score in 11/30/2020 was 674 (72nd percentile).  Continued to have chest pain and underwent cath on 02/11/2021 which showed severe multivessel CAD, moderate AS, normal filling pressures.  Underwent CABG x4 (LIMA-LAD, SVG-PDA, SVG-OM, SVG-D2 Y graft off OM) on 03/03/21 -Continue aspirin 81 mg daily -Continue atorvastatin 80 mg daily -Continue metoprolol 25 mg twice daily  Aortic stenosis: Mild to moderate on echo 07/2017.   Echocardiogram on 10/14/2020 showed normal biventricular function, mild LVH, mild to moderate aortic stenosis, mild aortic regurgitation, mild mitral regurgitation.  Cath 02/11/2021 suggested moderate aortic stenosis.  Underwent aortic valve replacement with 25 mm Inspiris Resilia on 03/03/2021 at time of CABG.  Echocardiogram on 04/30/21 showed EF 50 to 55%, mildly reduced RV function, normal functioning bioprosthetic aortic valve.  Hyperlipidemia: On atorvastatin 80 mg daily, LDL 61 on 04/22/21  Hypertension: On amlodipine 5 mg daily and losartan 50 mg daily prior to CABG. Switched to metoprolol 25 mg twice daily following CABG. continue metoprolol.  Mildly elevated in clinic today, asked to check BP daily for next week and call with results.  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  Zio patch x3 days 12/16/2020 showed 95 episodes of NSVT with longest lasting 12 beats and frequent PVCs (14% of beats).  Suspect related to ischemia in setting of severe multivessel disease.  Monitor for improvement status post CABG. -He is currently taking amiodarone.  Will discontinue.  At next f/u appointment, will plan Zio patch to quantify PVC burden  Paroxysmal atrial fibrillation: Occurred in postoperative setting after CABG.  Loading with amiodarone.  Continued on Xarelto.  Currently in sinus rhythm.  Will discontinue amiodarone  OSA: on CPAP, having issues with his machine   RTC in 3 months   Medication Adjustments/Labs and Tests Ordered: Current medicines are reviewed at length with the patient today.  Concerns regarding medicines are outlined above.  No orders of the defined types were placed in this encounter.    Meds ordered this encounter  Medications   metoprolol succinate (TOPROL XL) 25 MG 24  hr tablet    Sig: Take 1 tablet (25 mg total) by mouth daily.    Dispense:  90 tablet    Refill:  1      Patient  Instructions  Medication Instructions:  STOP Amiodarone  STOP Lopressor  START Metoprolol XL 25 mg daily   *If you need a refill on your cardiac medications before your next appointment, please call your pharmacy*   Follow-Up: At [facility], you and your health needs are our priority.  As part of our continuing mission to provide you with exceptional heart care, we have created designated Provider Care Teams.  These Care Teams include your primary Cardiologist (physician) and Advanced Practice Providers (APPs -  Physician Assistants and Nurse Practitioners) who all work together to provide you with the care you need, when you need it.  We recommend signing up for the patient portal called "MyChart".  Sign up information is provided on this After Visit Summary.  MyChart is used to connect with patients for Virtual Visits (Telemedicine).  Patients are able to view lab/test results, encounter notes, upcoming appointments, etc.  Non-urgent messages can be sent to your provider as well.   To learn more about what you can do with MyChart, go to [email or web address].    Your next appointment:   3 month(s)  The format for your next appointment:   In Person  Provider:   Donato Heinz, MD     Other Instructions Take BP once daily for 1 week and call us.        Signed, Donato Heinz, MD  05/07/2021 11:33 AM    [place]

## 2021-05-04 DIAGNOSIS — Z0000 Encounter for general adult medical examination without abnormal findings: Secondary | ICD-10-CM | POA: Diagnosis not present

## 2021-05-04 DIAGNOSIS — I700 Atherosclerosis of aorta: Secondary | ICD-10-CM | POA: Diagnosis not present

## 2021-05-04 DIAGNOSIS — E782 Mixed hyperlipidemia: Secondary | ICD-10-CM | POA: Diagnosis not present

## 2021-05-04 DIAGNOSIS — G7000 Myasthenia gravis without (acute) exacerbation: Secondary | ICD-10-CM | POA: Diagnosis not present

## 2021-05-04 DIAGNOSIS — G4733 Obstructive sleep apnea (adult) (pediatric): Secondary | ICD-10-CM | POA: Diagnosis not present

## 2021-05-04 DIAGNOSIS — R7303 Prediabetes: Secondary | ICD-10-CM | POA: Diagnosis not present

## 2021-05-04 DIAGNOSIS — I825Z9 Chronic embolism and thrombosis of unspecified deep veins of unspecified distal lower extremity: Secondary | ICD-10-CM | POA: Diagnosis not present

## 2021-05-04 DIAGNOSIS — I10 Essential (primary) hypertension: Secondary | ICD-10-CM | POA: Diagnosis not present

## 2021-05-06 ENCOUNTER — Telehealth: Payer: Self-pay | Admitting: Cardiology

## 2021-05-06 NOTE — Telephone Encounter (Addendum)
° °  Primary Cardiologist: None  Chart reviewed as part of pre-operative protocol coverage. Simple dental extractions are considered low risk procedures per guidelines and generally do not require any specific cardiac clearance. It is also generally accepted that for simple extractions and dental cleanings, there is no need to interrupt blood thinner therapy.   SBE prophylaxis is required for the patient for Aortic valve replacement   I will route this recommendation to the requesting party via [facility] fax function and remove from pre-op pool.  Please call with questions.  Emmaline Life, NP-C    05/06/2021, 2:46 PM [place] [id number] Office [phone] Fax [phone]

## 2021-05-06 NOTE — Telephone Encounter (Signed)
° °  Pre-operative Risk Assessment    Patient Name: Edward [name]  DOB: 06-May-1945 MRN: [id number]     Request for Surgical Clearance    Procedure:   Building the tooth back. Patience broke a tooth in his mouth  Date of Surgery:  Clearance 05/06/21                                 Surgeon:  Dr. Dorena Dew Surgeon's Group or Practice Name:  [facility] [employer] [facility] number:  [phone] Fax number:  [phone]   Type of Clearance Requested:   - Medical    Type of Anesthesia:  Local    Additional requests/questions:   No   Signed, Filomena Jungling   05/06/2021, 8:39 AM

## 2021-05-07 ENCOUNTER — Other Ambulatory Visit: Payer: Self-pay

## 2021-05-07 ENCOUNTER — Ambulatory Visit (INDEPENDENT_AMBULATORY_CARE_PROVIDER_SITE_OTHER): Payer: Medicare PPO | Admitting: Cardiology

## 2021-05-07 ENCOUNTER — Encounter: Payer: Self-pay | Admitting: Cardiology

## 2021-05-07 VITALS — BP 135/78 | HR 57 | Ht 69.0 in | Wt 198.2 lb

## 2021-05-07 DIAGNOSIS — I480 Paroxysmal atrial fibrillation: Secondary | ICD-10-CM | POA: Diagnosis not present

## 2021-05-07 DIAGNOSIS — I2510 Atherosclerotic heart disease of native coronary artery without angina pectoris: Secondary | ICD-10-CM

## 2021-05-07 DIAGNOSIS — E785 Hyperlipidemia, unspecified: Secondary | ICD-10-CM

## 2021-05-07 DIAGNOSIS — I493 Ventricular premature depolarization: Secondary | ICD-10-CM

## 2021-05-07 DIAGNOSIS — Z952 Presence of prosthetic heart valve: Secondary | ICD-10-CM

## 2021-05-07 MED ORDER — METOPROLOL SUCCINATE ER 25 MG PO TB24: 25.0000 mg | ORAL_TABLET | Freq: Every day | ORAL | 1 refills | Status: DC

## 2021-05-07 NOTE — Patient Instructions (Signed)
Medication Instructions:  STOP Amiodarone  STOP Lopressor  START Metoprolol XL 25 mg daily   *If you need a refill on your cardiac medications before your next appointment, please call your pharmacy*   Follow-Up: At [facility], you and your health needs are our priority.  As part of our continuing mission to provide you with exceptional heart care, we have created designated Provider Care Teams.  These Care Teams include your primary Cardiologist (physician) and Advanced Practice Providers (APPs -  Physician Assistants and Nurse Practitioners) who all work together to provide you with the care you need, when you need it.  We recommend signing up for the patient portal called "MyChart".  Sign up information is provided on this After Visit Summary.  MyChart is used to connect with patients for Virtual Visits (Telemedicine).  Patients are able to view lab/test results, encounter notes, upcoming appointments, etc.  Non-urgent messages can be sent to your provider as well.   To learn more about what you can do with MyChart, go to [email or web address].    Your next appointment:   3 month(s)  The format for your next appointment:   In Person  Provider:   Little Ishikawa, MD     Other Instructions Take BP once daily for 1 week and call us.

## 2021-05-11 ENCOUNTER — Telehealth: Payer: Self-pay | Admitting: Cardiology

## 2021-05-11 NOTE — Telephone Encounter (Signed)
° °  Pt c/o BP issue: STAT if pt c/o blurred vision, one-sided weakness or slurred speech  1. What are your last 5 BP readings? 141/101  2. Are you having any other symptoms (ex. Dizziness, headache, blurred vision, passed out)? None   3. What is your BP issue? Pt's wife said, Dr. Bjorn Pippin advised to monitor pt's BP, pt's wife said, pt's BP been averaging 145/ mid 70s, one time the bottom number was 91  then today his BP elevated to 141/101. She wanted to know if pt needs to start taking BP meds again

## 2021-05-11 NOTE — Telephone Encounter (Signed)
Recommend starting losartan 25 mg daily, check BMET in 1 week

## 2021-05-11 NOTE — Telephone Encounter (Signed)
Spoke with wife who reports patient's bp is 141/101 and 146/91. He is asymptomatic. Wife stated this elevation in bp is new and wants to know if patient should be back on losartan. Please advise on medication.

## 2021-05-12 ENCOUNTER — Encounter: Payer: Self-pay | Admitting: Cardiology

## 2021-05-12 DIAGNOSIS — Z79899 Other long term (current) drug therapy: Secondary | ICD-10-CM

## 2021-05-12 NOTE — Telephone Encounter (Signed)
Left message for patient to call back  

## 2021-05-12 NOTE — Telephone Encounter (Deleted)
[phone] Pts callback #

## 2021-05-12 NOTE — Telephone Encounter (Signed)
I have the pt on the line calling for recommendation... Angie Fava is out... can someone call the pt back and advise of Dr. Newman Nickels recommendation

## 2021-05-12 NOTE — Telephone Encounter (Incomplete Revision)
I have the pt on the line calling for recommendation... Angie Fava is out... can someone call the pt back and advise of Dr. Newman Nickels recommendation  [phone] Pts callback #

## 2021-05-14 ENCOUNTER — Encounter (HOSPITAL_COMMUNITY): Payer: Self-pay

## 2021-05-14 ENCOUNTER — Telehealth (HOSPITAL_COMMUNITY): Payer: Self-pay

## 2021-05-14 MED ORDER — LOSARTAN POTASSIUM 25 MG PO TABS: 25.0000 mg | ORAL_TABLET | Freq: Every day | ORAL | 3 refills | Status: DC

## 2021-05-14 NOTE — Telephone Encounter (Signed)
Pt insurance is active and benefits verified through [facility]. Co-pay $0.00, DED $0.00/$0.00 met, out of pocket $4,000.00/$40.00 met, co-insurance 0%. No pre-authorization required. Passport, 05/14/21 @ 4:18PM, QBV#[id number]

## 2021-05-14 NOTE — Telephone Encounter (Signed)
Attempted to call patient in regards to Cardiac Rehab - LM on VM Mailed letter 

## 2021-05-14 NOTE — Telephone Encounter (Signed)
Please see MyChart encounter.

## 2021-05-18 ENCOUNTER — Telehealth (HOSPITAL_COMMUNITY): Payer: Self-pay

## 2021-05-18 NOTE — Telephone Encounter (Signed)
Patient returned phone call and was interested in participating in the  Rehab Program Patient will come in for orientation on 06/03/2021@8 :30am and will attend the 8:30am exercise class.   Tourist information centre manager.

## 2021-05-18 NOTE — Telephone Encounter (Signed)
Pt wife [name] called and stated pt would like to change his class time. Patient will come in for orientation on 06/08/21 @ 930AM and will attend the 10:30AM exercise class.   Pensions consultant.

## 2021-05-19 DIAGNOSIS — Z79899 Other long term (current) drug therapy: Secondary | ICD-10-CM | POA: Diagnosis not present

## 2021-05-19 LAB — BASIC METABOLIC PANEL
BUN/Creatinine Ratio: 19 (ref 10–24)
BUN: 25 mg/dL (ref 8–27)
CO2: 25 mmol/L (ref 20–29)
Calcium: 9 mg/dL (ref 8.6–10.2)
Chloride: 105 mmol/L (ref 96–106)
Creatinine, Ser: 1.35 mg/dL — ABNORMAL HIGH (ref 0.76–1.27)
Glucose: 94 mg/dL (ref 70–99)
Potassium: 4.5 mmol/L (ref 3.5–5.2)
Sodium: 139 mmol/L (ref 134–144)
eGFR: 55 mL/min/{1.73_m2} — ABNORMAL LOW (ref >59)

## 2021-05-24 DIAGNOSIS — H2513 Age-related nuclear cataract, bilateral: Secondary | ICD-10-CM | POA: Diagnosis not present

## 2021-05-24 DIAGNOSIS — H349 Unspecified retinal vascular occlusion: Secondary | ICD-10-CM | POA: Diagnosis not present

## 2021-05-25 ENCOUNTER — Encounter: Payer: Self-pay | Admitting: Cardiology

## 2021-05-25 DIAGNOSIS — Z79899 Other long term (current) drug therapy: Secondary | ICD-10-CM

## 2021-05-28 ENCOUNTER — Encounter: Payer: Self-pay | Admitting: Cardiology

## 2021-05-31 MED ORDER — LOSARTAN POTASSIUM 50 MG PO TABS: 50.0000 mg | ORAL_TABLET | Freq: Every day | ORAL | 3 refills | Status: DC

## 2021-05-31 NOTE — Telephone Encounter (Signed)
Recommend increasing losartan to 50 mg daily and check BMET in 1 week

## 2021-06-03 ENCOUNTER — Ambulatory Visit (HOSPITAL_COMMUNITY): Payer: Medicare PPO

## 2021-06-04 ENCOUNTER — Telehealth (HOSPITAL_COMMUNITY): Payer: Self-pay

## 2021-06-04 NOTE — Telephone Encounter (Signed)
Successful telephone encounter to patient's wife (on [place]) to confirm cardiac rehab orientation appointment for 06/08/21 at 9:30 am. 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 Health assessment completed, all questions answered. Confirmed receipt of packet with directions and cardiac rehab contact. Informed of mask requirements to enter hospital.

## 2021-06-07 ENCOUNTER — Ambulatory Visit (HOSPITAL_COMMUNITY): Payer: Medicare PPO

## 2021-06-07 ENCOUNTER — Other Ambulatory Visit: Payer: Self-pay | Admitting: Physician Assistant

## 2021-06-08 ENCOUNTER — Other Ambulatory Visit: Payer: Self-pay

## 2021-06-08 ENCOUNTER — Encounter (HOSPITAL_COMMUNITY): Payer: Self-pay

## 2021-06-08 ENCOUNTER — Encounter (HOSPITAL_COMMUNITY)
Admission: RE | Admit: 2021-06-08 | Discharge: 2021-06-08 | Disposition: A | Discharge status: Home / Self Care | Payer: Medicare PPO | Source: Ambulatory Visit | Attending: Cardiology | Admitting: Cardiology

## 2021-06-08 VITALS — BP 112/72 | HR 63 | Ht 67.5 in | Wt 197.3 lb

## 2021-06-08 DIAGNOSIS — Z951 Presence of aortocoronary bypass graft: Secondary | ICD-10-CM | POA: Insufficient documentation

## 2021-06-08 DIAGNOSIS — Z79899 Other long term (current) drug therapy: Secondary | ICD-10-CM | POA: Diagnosis not present

## 2021-06-08 DIAGNOSIS — Z952 Presence of prosthetic heart valve: Secondary | ICD-10-CM | POA: Insufficient documentation

## 2021-06-08 NOTE — Progress Notes (Signed)
Cardiac Rehab Medication Review by a Nurse  Does the patient  feel that his/her medications are working for him/her?  YES  Has the patient been experiencing any side effects to the medications prescribed?   NO  Does the patient measure his/her own blood pressure or blood glucose at home?  YES  Does the patient have any problems obtaining medications due to transportation or finances?   NO  Understanding of regimen: good Understanding of indications: good Potential of compliance: good    Nurse comments: Edward [name] is taking his medications as prescribed and has a good understanding of what his medications are for.Rayn checks his blood pressures on a daily basis.    Christa See [facility] RN 06/08/2021 11:43 AM

## 2021-06-08 NOTE — Progress Notes (Addendum)
Cardiac Individual Treatment Plan  Patient Details  Name: Edward [name] MRN: [id number] Date of Birth: 05-Jun-1945 Referring Provider:   Flowsheet Row CARDIAC REHAB PHASE II ORIENTATION from 06/08/2021 in [facility]  Referring Provider Donato Heinz, MD       Initial Encounter Date:  [place] PHASE II ORIENTATION from 06/08/2021 in [facility]  Date 06/08/21       Visit Diagnosis: 03/03/21 S/P CABG x 4  03/03/21 S/P AVR (aortic valve replacement)  Patient's Home Medications on Admission:  Current Outpatient Medications:    acetaminophen (TYLENOL) 500 MG tablet, Take 1,000 mg by mouth every 6 (six) hours as needed for moderate pain or mild pain., Disp: , Rfl:    allopurinol (ZYLOPRIM) 300 MG tablet, Take 300 mg by mouth in the morning., Disp: , Rfl:    aspirin EC 81 MG tablet, Take 81 mg by mouth in the morning. Swallow whole., Disp: , Rfl:    atorvastatin (LIPITOR) 80 MG tablet, Take 1 tablet (80 mg total) by mouth daily. (Patient taking differently: Take 80 mg by mouth every evening.), Disp: 30 tablet, Rfl: 11   Biotin 1000 MCG tablet, Take 1,000 mcg by mouth in the morning., Disp: , Rfl:    carbamide peroxide (DEBROX) 6.5 % OTIC solution, 5 drops 2 (two) times a week. Wax build up, Disp: , Rfl:    famotidine (PEPCID) 20 MG tablet, Take 20 mg by mouth at bedtime., Disp: , Rfl:    fish oil-omega-3 fatty acids 1000 MG capsule, Take 1 g by mouth in the morning and at bedtime., Disp: , Rfl:    losartan (COZAAR) 50 MG tablet, Take 1 tablet (50 mg total) by mouth daily. (Patient taking differently: Take 50 mg by mouth every evening.), Disp: 90 tablet, Rfl: 3   metoprolol succinate (TOPROL XL) 25 MG 24 hr tablet, Take 1 tablet (25 mg total) by mouth daily., Disp: 90 tablet, Rfl: 1   Polyethyl Glycol-Propyl Glycol (LUBRICANT EYE DROPS) 0.4-0.3 % SOLN, Place 1-2 drops into both eyes 3 (three) times daily  as needed (tired/dry/irritated eyes.)., Disp: , Rfl:    predniSONE (DELTASONE) 5 MG tablet, TAKE 1 TABLET BY MOUTH EVERY DAY (Patient taking differently: Take 5 mg by mouth daily with breakfast.), Disp: 90 tablet, Rfl: 2   pyridostigmine (MESTINON) 60 MG tablet, 1/2 tablet three times a day, Disp: 135 tablet, Rfl: 3   XARELTO 20 MG TABS tablet, Take 20 mg by mouth in the morning., Disp: , Rfl:    traZODone (DESYREL) 50 MG tablet, TAKE 1 TABLET BY MOUTH EVERYDAY AT BEDTIME (Patient not taking: Reported on 06/03/2021), Disp: 90 tablet, Rfl: 2  Past Medical History: 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 by echo 07/2017   Chronic kidney disease    kidney stones   Colon polyps    Coronary artery disease 2022   Diplopia 10/15/2015   Diverticulosis    DVT (deep venous thrombosis) ([place])    2018, 2019   Erectile dysfunction    GERD (gastroesophageal reflux disease)    with Barrett's esophagus   Gout    Hearing loss    hearing aides   Heart murmur    pt says he has had it since he was a child, no issues ever mentioned   HTN (hypertension)  Hypercholesteremia    Migraine headache    Myasthenia gravis (HCC)    Obesity    OSA (obstructive sleep apnea)    AHI 43/hr now on CPAP at 10cm H2O   Peripheral neuropathy 01/14/2019   Right foot drop 12/03/2018    Tobacco Use: Social History   Tobacco Use  Smoking Status Never  Smokeless Tobacco Never    Labs: Recent Review Flowsheet Data     Labs for ITP Cardiac and Pulmonary Rehab Latest Ref Rng & Units 03/03/2021 03/03/2021 03/03/2021 03/04/2021 04/22/2021   Cholestrol 100 - 199 mg/dL - - - - 128   LDLCALC 0 - 99 mg/dL - - - - 61   HDL >39 mg/dL - - - - 39(L)   Trlycerides 0 - 149 mg/dL - - - - 165(H)   Hemoglobin A1c 4.8 - 5.6 % - - - - -   PHART 7.350 - 7.450 7.322(L) 7.305(L) 7.384 7.378 -   PCO2ART 32.0 - 48.0 mmHg  47.8 45.9 38.3 36.6 -   HCO3 20.0 - 28.0 mmol/L 24.8 23.1 22.9 21.3 -   TCO2 22 - 32 mmol/L 26 25 24 22  -   ACIDBASEDEF 0.0 - 2.0 mmol/L 1.0 3.0(H) 2.0 3.0(H) -   O2SAT % 100.0 96.0 98.0 98.0 -       Capillary Blood Glucose: Lab Results  Component Value Date   GLUCAP 114 (H) 03/12/2021   GLUCAP 115 (H) 03/11/2021   GLUCAP 129 (H) 03/11/2021   GLUCAP 110 (H) 03/11/2021   GLUCAP 99 03/11/2021     Exercise Target Goals: Exercise Program Goal: Individual exercise prescription set using results from initial 6 min walk test and THRR while considering  patients activity barriers and safety.   Exercise Prescription Goal: Initial exercise prescription builds to 30-45 minutes a day of aerobic activity, 2-3 days per week.  Home exercise guidelines will be given to patient during program as part of exercise prescription that the participant will acknowledge.  Activity Barriers & Risk Stratification:  Activity Barriers & Cardiac Risk Stratification - 06/08/21 1017       Activity Barriers & Cardiac Risk Stratification   Activity Barriers Assistive Device;Muscular Weakness;History of Falls;Other (comment);Balance Concerns    Comments Peripheral neuropathy, both feet. Bilateral leg weakness from myasthenia gravis.    Cardiac Risk Stratification High             6 Minute Walk:  6 Minute Walk     Row Name 06/08/21 1001         6 Minute Walk   Phase Initial     Distance 1342 feet     Walk Time 6 minutes     # of Rest Breaks 0     MPH 2.54     METS 2.45     RPE 11     Perceived Dyspnea  0     VO2 Peak 8.59     Symptoms No     Resting HR 63 bpm     Resting BP 112/72     Resting Oxygen Saturation  97 %     Exercise Oxygen Saturation  during 6 min walk 96 %     Max Ex. HR 84 bpm     Max Ex. BP 134/62     2 Minute Post BP 114/62              Oxygen Initial Assessment:   Oxygen Re-Evaluation:   Oxygen Discharge (Final Oxygen Re-Evaluation):   Initial  Exercise Prescription:  Initial Exercise Prescription - 06/08/21 1400       Date of Initial Exercise RX and Referring Provider   Date 06/08/21    Referring Provider Donato Heinz, MD    Expected Discharge Date 08/06/21      Recumbant Bike   Level --    Watts --    Minutes --    METs --      NuStep   Level 1    SPM 85    Minutes 15    METs 2      Track   Laps 13    Minutes 15    METs 2.5      Prescription Details   Frequency (times per week) 3    Duration Progress to 30 minutes of continuous aerobic without signs/symptoms of physical distress      Intensity   THRR 40-80% of Max Heartrate 58-117    Ratings of Perceived Exertion 11-13    Perceived Dyspnea 0-4      Progression   Progression Continue to progress workloads to maintain intensity without signs/symptoms of physical distress.      Resistance Training   Training Prescription Yes    Weight 2 lbs    Reps 10-15             Perform Capillary Blood Glucose checks as needed.  Exercise Prescription Changes:   Exercise Comments:   Exercise Goals and Review:   Exercise Goals     Row Name 06/08/21 0949             Exercise Goals   Increase Physical Activity Yes       Intervention Provide advice, education, support and counseling about physical activity/exercise needs.;Develop an individualized exercise prescription for aerobic and resistive training based on initial evaluation findings, risk stratification, comorbidities and participant's personal goals.       Expected Outcomes Short Term: Attend rehab on a regular basis to increase amount of physical activity.;Long Term: Exercising regularly at least 3-5 days a week.;Long Term: Add in home exercise to make exercise part of routine and to increase amount of physical activity.       Increase Strength and Stamina Yes       Intervention Provide advice, education, support and counseling about physical activity/exercise needs.;Develop an  individualized exercise prescription for aerobic and resistive training based on initial evaluation findings, risk stratification, comorbidities and participant's personal goals.       Expected Outcomes Short Term: Increase workloads from initial exercise prescription for resistance, speed, and METs.;Short Term: Perform resistance training exercises routinely during rehab and add in resistance training at home;Long Term: Improve cardiorespiratory fitness, muscular endurance and strength as measured by increased METs and functional capacity (6MWT)       Able to understand and use rate of perceived exertion (RPE) scale Yes       Intervention Provide education and explanation on how to use RPE scale       Expected Outcomes Short Term: Able to use RPE daily in rehab to express subjective intensity level;Long Term:  Able to use RPE to guide intensity level when exercising independently       Knowledge and understanding of Target Heart Rate Range (THRR) Yes       Intervention Provide education and explanation of THRR including how the numbers were predicted and where they are located for reference       Expected Outcomes Short Term: Able to state/look up THRR;Long Term: Able  to use THRR to govern intensity when exercising independently;Short Term: Able to use daily as guideline for intensity in rehab       Able to check pulse independently Yes       Intervention Provide education and demonstration on how to check pulse in carotid and radial arteries.;Review the importance of being able to check your own pulse for safety during independent exercise       Expected Outcomes Short Term: Able to explain why pulse checking is important during independent exercise;Long Term: Able to check pulse independently and accurately       Understanding of Exercise Prescription Yes       Intervention Provide education, explanation, and written materials on patient's individual exercise prescription       Expected Outcomes  Short Term: Able to explain program exercise prescription;Long Term: Able to explain home exercise prescription to exercise independently                Exercise Goals Re-Evaluation :   Discharge Exercise Prescription (Final Exercise Prescription Changes):   Nutrition:  Target Goals: Understanding of nutrition guidelines, daily intake of sodium 1500mg , cholesterol 200mg , calories 30% from fat and 7% or less from saturated fats, daily to have 5 or more servings of fruits and vegetables.  Biometrics:  Pre Biometrics - 06/08/21 0948       Pre Biometrics   Waist Circumference 41.75 inches    Hip Circumference 42.5 inches    Waist to Hip Ratio 0.98 %    Triceps Skinfold 20 mm    % Body Fat 30.8 %    Grip Strength 28 kg    Flexibility 0 in   knees bent   Single Leg Stand 1 seconds              Nutrition Therapy Plan and Nutrition Goals:   Nutrition Assessments:  MEDIFICTS Score Key: ?70 Need to make dietary changes  40-70 Heart Healthy Diet ? 40 Therapeutic Level Cholesterol Diet    Picture Your Plate Scores: [id number] Unhealthy dietary pattern with much room for improvement. 41-50 Dietary pattern unlikely to meet recommendations for good health and room for improvement. 51-60 More healthful dietary pattern, with some room for improvement.  >60 Healthy dietary pattern, although there may be some specific behaviors that could be improved.    Nutrition Goals Re-Evaluation:   Nutrition Goals Re-Evaluation:   Nutrition Goals Discharge (Final Nutrition Goals Re-Evaluation):   Psychosocial: Target Goals: Acknowledge presence or absence of significant depression and/or stress, maximize coping skills, provide positive support system. Participant is able to verbalize types and ability to use techniques and skills needed for reducing stress and depression.  Initial Review & Psychosocial Screening:  Initial Psych Review & Screening - 06/08/21 1305       Initial  Review   Current issues with Current Stress Concerns    Source of Stress Concerns Chronic Illness;Unable to participate in former interests or hobbies    Comments Zamere has myastenia gravis and neuropathy in addtion to [place]? Yes   Jenson has his wife for support     Barriers   Psychosocial barriers to participate in program The patient should benefit from training in stress management and relaxation.      Screening Interventions   Interventions Encouraged to exercise;To provide support and resources with identified psychosocial needs;Provide feedback about the scores to participant    Expected Outcomes Long Term Goal: Stressors  or current issues are controlled or eliminated.;Short Term goal: Identification and review with participant of any Quality of Life or Depression concerns found by scoring the questionnaire.             Quality of Life Scores:  Quality of Life - 06/08/21 1411       Quality of Life   Select Quality of Life      Quality of Life Scores   Health/Function Pre 22.77 %    Socioeconomic Pre 24.67 %    Psych/Spiritual Pre 25.43 %    Family Pre 28.8 %    GLOBAL Pre 24.59 %            Scores of 19 and below usually indicate a poorer quality of life in these areas.  A difference of  2-3 points is a clinically meaningful difference.  A difference of 2-3 points in the total score of the Quality of Life Index has been associated with significant improvement in overall quality of life, self-image, physical symptoms, and general health in studies assessing change in quality of life.  PHQ-9: Recent Review Flowsheet Data     Depression screen [facility] 2/9 06/08/2021   Decreased Interest 0   Down, Depressed, Hopeless 0   PHQ - 2 Score 0      Interpretation of Total Score  Total Score Depression Severity:  1-4 = Minimal depression, 5-9 = Mild depression, 10-14 = Moderate depression, 15-19 = Moderately severe depression, 20-27 =  Severe depression   Psychosocial Evaluation and Intervention:   Psychosocial Re-Evaluation:   Psychosocial Discharge (Final Psychosocial Re-Evaluation):   Vocational Rehabilitation: Provide vocational rehab assistance to qualifying candidates.   Vocational Rehab Evaluation & Intervention:  Vocational Rehab - 06/08/21 1312       Initial Vocational Rehab Evaluation & Intervention   Assessment shows need for Vocational Rehabilitation No   Elma is retired and does not need vocational rehab at this time            Education: Education Goals: Education classes will be provided on a weekly basis, covering required topics. Participant will state understanding/return demonstration of topics presented.  Learning Barriers/Preferences:  Learning Barriers/Preferences - 06/08/21 1310       Learning Barriers/Preferences   Learning Barriers Hearing;Exercise Concerns   Hard of hearing wears bilateral hearing aides, has balance issues at times due to myastenia gravis, Neuropathy does not use assistive device   Learning Preferences None             Education Topics: Count Your Pulse:  -Group instruction provided by verbal instruction, demonstration, patient participation and written materials to support subject.  Instructors address importance of being able to find your pulse and how to count your pulse when at home without a heart monitor.  Patients get hands on experience counting their pulse with staff help and individually.   Heart Attack, Angina, and Risk Factor Modification:  -Group instruction provided by verbal instruction, video, and written materials to support subject.  Instructors address signs and symptoms of angina and heart attacks.    Also discuss risk factors for heart disease and how to make changes to improve heart health risk factors.   Functional Fitness:  -Group instruction provided by verbal instruction, demonstration, patient participation, and written  materials to support subject.  Instructors address safety measures for doing things around the house.  Discuss how to get up and down off the floor, how to pick things up properly, how to safely get out of  a chair without assistance, and balance training.   Meditation and Mindfulness:  -Group instruction provided by verbal instruction, patient participation, and written materials to support subject.  Instructor addresses importance of mindfulness and meditation practice to help reduce stress and improve awareness.  Instructor also leads participants through a meditation exercise.    Stretching for Flexibility and Mobility:  -Group instruction provided by verbal instruction, patient participation, and written materials to support subject.  Instructors lead participants through series of stretches that are designed to increase flexibility thus improving mobility.  These stretches are additional exercise for major muscle groups that are typically performed during regular warm up and cool down.   Hands Only CPR:  -Group verbal, video, and participation provides a basic overview of AHA guidelines for community CPR. Role-play of emergencies allow participants the opportunity to practice calling for help and chest compression technique with discussion of AED use.   Hypertension: -Group verbal and written instruction that provides a basic overview of hypertension including the most recent diagnostic guidelines, risk factor reduction with self-care instructions and medication management.    Nutrition I class: Heart Healthy Eating:  -Group instruction provided by PowerPoint slides, verbal discussion, and written materials to support subject matter. The instructor gives an explanation and review of the Therapeutic Lifestyle Changes diet recommendations, which includes a discussion on lipid goals, dietary fat, sodium, fiber, plant stanol/sterol esters, sugar, and the components of a well-balanced, healthy  diet.   Nutrition II class: Lifestyle Skills:  -Group instruction provided by PowerPoint slides, verbal discussion, and written materials to support subject matter. The instructor gives an explanation and review of label reading, grocery shopping for heart health, heart healthy recipe modifications, and ways to make healthier choices when eating out.   Diabetes Question & Answer:  -Group instruction provided by PowerPoint slides, verbal discussion, and written materials to support subject matter. The instructor gives an explanation and review of diabetes co-morbidities, pre- and post-prandial blood glucose goals, pre-exercise blood glucose goals, signs, symptoms, and treatment of hypoglycemia and hyperglycemia, and foot care basics.   Diabetes Blitz:  -Group instruction provided by PowerPoint slides, verbal discussion, and written materials to support subject matter. The instructor gives an explanation and review of the physiology behind type 1 and type 2 diabetes, diabetes medications and rational behind using different medications, pre- and post-prandial blood glucose recommendations and Hemoglobin A1c goals, diabetes diet, and exercise including blood glucose guidelines for exercising safely.    Portion Distortion:  -Group instruction provided by PowerPoint slides, verbal discussion, written materials, and food models to support subject matter. The instructor gives an explanation of serving size versus portion size, changes in portions sizes over the last 20 years, and what consists of a serving from each food group.   Stress Management:  -Group instruction provided by verbal instruction, video, and written materials to support subject matter.  Instructors review role of stress in heart disease and how to cope with stress positively.     Exercising on Your Own:  -Group instruction provided by verbal instruction, power point, and written materials to support subject.  Instructors discuss  benefits of exercise, components of exercise, frequency and intensity of exercise, and end points for exercise.  Also discuss use of nitroglycerin and activating EMS.  Review options of places to exercise outside of rehab.  Review guidelines for sex with heart disease.   Cardiac Drugs I:  -Group instruction provided by verbal instruction and written materials to support subject.  Instructor reviews cardiac drug  classes: antiplatelets, anticoagulants, beta blockers, and statins.  Instructor discusses reasons, side effects, and lifestyle considerations for each drug class.   Cardiac Drugs II:  -Group instruction provided by verbal instruction and written materials to support subject.  Instructor reviews cardiac drug classes: angiotensin converting enzyme inhibitors (ACE-I), angiotensin II receptor blockers (ARBs), nitrates, and calcium channel blockers.  Instructor discusses reasons, side effects, and lifestyle considerations for each drug class.   Anatomy and Physiology of the Circulatory System:  Group verbal and written instruction and models provide basic cardiac anatomy and physiology, with the coronary electrical and arterial systems. Review of: AMI, Angina, Valve disease, Heart Failure, Peripheral Artery Disease, Cardiac Arrhythmia, Pacemakers, and the ICD.   Other Education:  -Group or individual verbal, written, or video instructions that support the educational goals of the cardiac rehab program.   Holiday Eating Survival Tips:  -Group instruction provided by PowerPoint slides, verbal discussion, and written materials to support subject matter. The instructor gives patients tips, tricks, and techniques to help them not only survive but enjoy the holidays despite the onslaught of food that accompanies the holidays.   Knowledge Questionnaire Score:  Knowledge Questionnaire Score - 06/08/21 1412       Knowledge Questionnaire Score   Pre Score 21/24             Core  Components/Risk Factors/Patient Goals at Admission:  Personal Goals and Risk Factors at Admission - 06/08/21 1313       Core Components/Risk Factors/Patient Goals on Admission    Weight Management Yes;Weight Loss;Obesity    Intervention Weight Management: Develop a combined nutrition and exercise program designed to reach desired caloric intake, while maintaining appropriate intake of nutrient and fiber, sodium and fats, and appropriate energy expenditure required for the weight goal.;Weight Management: Provide education and appropriate resources to help participant work on and attain dietary goals.;Weight Management/Obesity: Establish reasonable short term and long term weight goals.    Admit Weight 197 lb 5 oz (89.5 kg)    Goal Weight: Short Term 193 lb (87.5 kg)    Goal Weight: Long Term 180 lb (81.6 kg)    Expected Outcomes Short Term: Continue to assess and modify interventions until short term weight is achieved;Long Term: Adherence to nutrition and physical activity/exercise program aimed toward attainment of established weight goal;Weight Loss: Understanding of general recommendations for a balanced deficit meal plan, which promotes 1-2 lb weight loss per week and includes a negative energy balance of [phone] kcal/d;Understanding recommendations for meals to include 15-35% energy as protein, 25-35% energy from fat, 35-60% energy from carbohydrates, less than 200mg  of dietary cholesterol, 20-35 gm of total fiber daily;Understanding of distribution of calorie intake throughout the day with the consumption of 4-5 meals/snacks    Hypertension Yes    Intervention Provide education on lifestyle modifcations including regular physical activity/exercise, weight management, moderate sodium restriction and increased consumption of fresh fruit, vegetables, and low fat dairy, alcohol moderation, and smoking cessation.;Monitor prescription use compliance.    Expected Outcomes Short Term: Continued  assessment and intervention until BP is < 140/85mm HG in hypertensive participants. < 130/23mm HG in hypertensive participants with diabetes, heart failure or chronic kidney disease.;Long Term: Maintenance of blood pressure at goal levels.    Lipids Yes    Intervention Provide education and support for participant on nutrition & aerobic/resistive exercise along with prescribed medications to achieve LDL 70mg , HDL >40mg .    Expected Outcomes Short Term: Participant states understanding of desired cholesterol values and is compliant  with medications prescribed. Participant is following exercise prescription and nutrition guidelines.;Long Term: Cholesterol controlled with medications as prescribed, with individualized exercise RX and with personalized nutrition plan. Value goals: LDL < 70mg , HDL > 40 mg.    Stress Yes    Intervention Offer individual and/or small group education and counseling on adjustment to heart disease, stress management and health-related lifestyle change. Teach and support self-help strategies.;Refer participants experiencing significant psychosocial distress to appropriate mental health specialists for further evaluation and treatment. When possible, include family members and significant others in education/counseling sessions.    Expected Outcomes Short Term: Participant demonstrates changes in health-related behavior, relaxation and other stress management skills, ability to obtain effective social support, and compliance with psychotropic medications if prescribed.;Long Term: Emotional wellbeing is indicated by absence of clinically significant psychosocial distress or social isolation.    Personal Goal Other Yes    Personal Goal Be able to walk further. Resume exercise routine at [facility].    Intervention Provide individualized exercise routine including aerobic and resistance training to help increase endurance and strength.    Expected Outcomes Patient will increase  walking distance as measured by 6-minute walk test. Patient will resume exercise at [facility].             Core Components/Risk Factors/Patient Goals Review:    Core Components/Risk Factors/Patient Goals at Discharge (Final Review):    ITP Comments:  ITP Comments     Row Name 06/08/21 0949           ITP Comments Medical Director- Dr. Fransico Him, MD                Comments: Patient attended orientation for the cardiac rehabilitation program on 06/08/2021 to review rules and guidelines for the program. Completed 6-minute walk test, Initial ITP, and exercise prescription. Offered assistive device for walk test, but patient declined. Vital signs stable. Telemetry- Normal sinus rhythm with T-wave inversion, asymptomatic. Safety measures and social distancing in place per CDC guidelines.

## 2021-06-09 ENCOUNTER — Ambulatory Visit (HOSPITAL_COMMUNITY): Payer: Medicare PPO

## 2021-06-09 LAB — BASIC METABOLIC PANEL
BUN/Creatinine Ratio: 19 (ref 10–24)
BUN: 25 mg/dL (ref 8–27)
CO2: 24 mmol/L (ref 20–29)
Calcium: 9.5 mg/dL (ref 8.6–10.2)
Chloride: 104 mmol/L (ref 96–106)
Creatinine, Ser: 1.33 mg/dL — ABNORMAL HIGH (ref 0.76–1.27)
Glucose: 110 mg/dL — ABNORMAL HIGH (ref 70–99)
Potassium: 5.2 mmol/L (ref 3.5–5.2)
Sodium: 140 mmol/L (ref 134–144)
eGFR: 56 mL/min/{1.73_m2} — ABNORMAL LOW (ref >59)

## 2021-06-10 DIAGNOSIS — L570 Actinic keratosis: Secondary | ICD-10-CM | POA: Diagnosis not present

## 2021-06-10 DIAGNOSIS — L820 Inflamed seborrheic keratosis: Secondary | ICD-10-CM | POA: Diagnosis not present

## 2021-06-10 DIAGNOSIS — Z85828 Personal history of other malignant neoplasm of skin: Secondary | ICD-10-CM | POA: Diagnosis not present

## 2021-06-10 DIAGNOSIS — L821 Other seborrheic keratosis: Secondary | ICD-10-CM | POA: Diagnosis not present

## 2021-06-11 ENCOUNTER — Ambulatory Visit (HOSPITAL_COMMUNITY): Payer: Medicare PPO

## 2021-06-14 ENCOUNTER — Other Ambulatory Visit: Payer: Self-pay

## 2021-06-14 ENCOUNTER — Ambulatory Visit (HOSPITAL_COMMUNITY): Payer: Medicare PPO

## 2021-06-14 ENCOUNTER — Encounter (HOSPITAL_COMMUNITY)
Admission: RE | Admit: 2021-06-14 | Discharge: 2021-06-14 | Disposition: A | Discharge status: Home / Self Care | Payer: Medicare PPO | Source: Ambulatory Visit | Attending: Cardiology | Admitting: Cardiology

## 2021-06-14 DIAGNOSIS — Z952 Presence of prosthetic heart valve: Secondary | ICD-10-CM

## 2021-06-14 DIAGNOSIS — Z951 Presence of aortocoronary bypass graft: Secondary | ICD-10-CM | POA: Diagnosis not present

## 2021-06-14 NOTE — Progress Notes (Signed)
Daily Session Note  Patient Details  Name: AXLE PARFAIT MRN: [id number] Date of Birth: 1946/02/08 Referring Provider:   Flowsheet Row CARDIAC REHAB PHASE II ORIENTATION from 06/08/2021 in [place]  Referring Provider Donato Heinz, MD       Encounter Date: 06/14/2021  Check In:  Session Check In - 06/14/21 1029       Check-In   Supervising physician immediately available to respond to emergencies [facility] immediately available    Physician(s) Dr. Doristine Bosworth    Location [facility]    Staff Present Esmeralda Links BS, ACSM EP-C, Exercise Physiologist;Lilton Pare, RN, Deland Pretty, MS, ACSM CEP, Exercise Physiologist;David Lilyan Punt, MS, ACSM-CEP, CCRP, Exercise Physiologist    Virtual Visit No    Medication changes reported     No    Fall or balance concerns reported    No    Tobacco Cessation No Change    Warm-up and Cool-down Performed as group-led instruction    Resistance Training Performed Yes    VAD Patient? No    PAD/SET Patient? No      Pain Assessment   Currently in Pain? No/denies    Pain Score 0-No pain    Multiple Pain Sites No             Capillary Blood Glucose: No results found for this or any previous visit (from the past 24 hour(s)).   Exercise Prescription Changes - 06/14/21 1020       Response to Exercise   Blood Pressure (Admit) 114/70    Blood Pressure (Exercise) 152/84    Blood Pressure (Exit) 118/72    Heart Rate (Admit) 65 bpm    Heart Rate (Exercise) 101 bpm    Heart Rate (Exit) 66 bpm    Rating of Perceived Exertion (Exercise) 13    Symptoms None    Comments Off to a good start with exercise. Some balance concerns during stretching.    Duration Continue with 30 min of aerobic exercise without signs/symptoms of physical distress.    Intensity THRR unchanged      Progression   Progression Continue to progress workloads to maintain intensity without signs/symptoms  of physical distress.    Average METs 2.5      Resistance Training   Training Prescription Yes    Weight 2 lbs    Reps 10-15    Time 10 Minutes      Interval Training   Interval Training No      NuStep   Level 1    SPM 85    Minutes 15    METs 2.3      Track   Laps 14    Minutes 15    METs 2.62             Social History   Tobacco Use  Smoking Status Never  Smokeless Tobacco Never    Goals Met:  Exercise tolerated well No report of concerns or symptoms today Strength training completed today  Goals Unmet:  Not Applicable  Comments: Giomar started cardiac rehab today.  Pt tolerated light exercise without difficulty. VSS, telemetry-Sinus Rhythm  first degree heart block, t wave inversion, asymptomatic.  Medication list reconciled. Pt denies barriers to medicaiton compliance.  PSYCHOSOCIAL ASSESSMENT:  PHQ-0. Pt exhibits positive coping skills, hopeful outlook with supportive family. No psychosocial needs identified at this time, no psychosocial interventions necessary.    Pt enjoys wood working.   Pt oriented to exercise  equipment and routine.    Understanding verbalized. Barnet Pall, RN,BSN 06/14/2021 1:32 PM    Dr. Fransico Him is Medical Director for Cardiac Rehab at [facility].

## 2021-06-16 ENCOUNTER — Ambulatory Visit (HOSPITAL_COMMUNITY): Payer: Medicare PPO

## 2021-06-16 ENCOUNTER — Encounter (HOSPITAL_COMMUNITY)
Admission: RE | Admit: 2021-06-16 | Discharge: 2021-06-16 | Disposition: A | Discharge status: Home / Self Care | Payer: Medicare PPO | Source: Ambulatory Visit | Attending: Cardiology | Admitting: Cardiology

## 2021-06-16 ENCOUNTER — Other Ambulatory Visit: Payer: Self-pay

## 2021-06-16 DIAGNOSIS — Z951 Presence of aortocoronary bypass graft: Secondary | ICD-10-CM | POA: Diagnosis not present

## 2021-06-16 DIAGNOSIS — Z952 Presence of prosthetic heart valve: Secondary | ICD-10-CM

## 2021-06-18 ENCOUNTER — Other Ambulatory Visit: Payer: Self-pay

## 2021-06-18 ENCOUNTER — Ambulatory Visit (HOSPITAL_COMMUNITY): Payer: Medicare PPO

## 2021-06-18 ENCOUNTER — Encounter (HOSPITAL_COMMUNITY)
Admission: RE | Admit: 2021-06-18 | Discharge: 2021-06-18 | Disposition: A | Discharge status: Home / Self Care | Payer: Medicare PPO | Source: Ambulatory Visit | Attending: Cardiology | Admitting: Cardiology

## 2021-06-18 DIAGNOSIS — Z951 Presence of aortocoronary bypass graft: Secondary | ICD-10-CM | POA: Diagnosis not present

## 2021-06-18 DIAGNOSIS — Z952 Presence of prosthetic heart valve: Secondary | ICD-10-CM | POA: Diagnosis not present

## 2021-06-21 ENCOUNTER — Ambulatory Visit (HOSPITAL_COMMUNITY): Payer: Medicare PPO

## 2021-06-21 ENCOUNTER — Other Ambulatory Visit: Payer: Self-pay

## 2021-06-21 ENCOUNTER — Encounter (HOSPITAL_COMMUNITY)
Admission: RE | Admit: 2021-06-21 | Discharge: 2021-06-21 | Disposition: A | Discharge status: Home / Self Care | Payer: Medicare PPO | Source: Ambulatory Visit | Attending: Cardiology | Admitting: Cardiology

## 2021-06-21 DIAGNOSIS — Z951 Presence of aortocoronary bypass graft: Secondary | ICD-10-CM | POA: Diagnosis not present

## 2021-06-21 DIAGNOSIS — Z952 Presence of prosthetic heart valve: Secondary | ICD-10-CM | POA: Diagnosis not present

## 2021-06-23 ENCOUNTER — Ambulatory Visit (HOSPITAL_COMMUNITY): Payer: Medicare PPO

## 2021-06-23 ENCOUNTER — Encounter (HOSPITAL_COMMUNITY)
Admission: RE | Admit: 2021-06-23 | Discharge: 2021-06-23 | Disposition: A | Discharge status: Home / Self Care | Payer: Medicare PPO | Source: Ambulatory Visit | Attending: Cardiology | Admitting: Cardiology

## 2021-06-23 ENCOUNTER — Other Ambulatory Visit: Payer: Self-pay

## 2021-06-23 DIAGNOSIS — Z951 Presence of aortocoronary bypass graft: Secondary | ICD-10-CM | POA: Diagnosis not present

## 2021-06-23 DIAGNOSIS — Z952 Presence of prosthetic heart valve: Secondary | ICD-10-CM | POA: Diagnosis not present

## 2021-06-25 ENCOUNTER — Other Ambulatory Visit: Payer: Self-pay

## 2021-06-25 ENCOUNTER — Ambulatory Visit (HOSPITAL_COMMUNITY): Payer: Medicare PPO

## 2021-06-25 ENCOUNTER — Encounter (HOSPITAL_COMMUNITY)
Admission: RE | Admit: 2021-06-25 | Discharge: 2021-06-25 | Disposition: A | Discharge status: Home / Self Care | Payer: Medicare PPO | Source: Ambulatory Visit | Attending: Cardiology | Admitting: Cardiology

## 2021-06-25 DIAGNOSIS — Z952 Presence of prosthetic heart valve: Secondary | ICD-10-CM | POA: Diagnosis not present

## 2021-06-25 DIAGNOSIS — Z951 Presence of aortocoronary bypass graft: Secondary | ICD-10-CM

## 2021-06-28 ENCOUNTER — Encounter (HOSPITAL_COMMUNITY)
Admission: RE | Admit: 2021-06-28 | Discharge: 2021-06-28 | Disposition: A | Discharge status: Home / Self Care | Payer: Medicare PPO | Source: Ambulatory Visit | Attending: Cardiology | Admitting: Cardiology

## 2021-06-28 ENCOUNTER — Other Ambulatory Visit: Payer: Self-pay

## 2021-06-28 ENCOUNTER — Ambulatory Visit (HOSPITAL_COMMUNITY): Payer: Medicare PPO

## 2021-06-28 DIAGNOSIS — Z952 Presence of prosthetic heart valve: Secondary | ICD-10-CM

## 2021-06-28 DIAGNOSIS — Z951 Presence of aortocoronary bypass graft: Secondary | ICD-10-CM | POA: Diagnosis not present

## 2021-06-30 ENCOUNTER — Other Ambulatory Visit: Payer: Self-pay

## 2021-06-30 ENCOUNTER — Ambulatory Visit (HOSPITAL_COMMUNITY): Payer: Medicare PPO

## 2021-06-30 ENCOUNTER — Encounter (HOSPITAL_COMMUNITY)
Admission: RE | Admit: 2021-06-30 | Discharge: 2021-06-30 | Disposition: A | Discharge status: Home / Self Care | Payer: Medicare PPO | Source: Ambulatory Visit | Attending: Cardiology | Admitting: Cardiology

## 2021-06-30 DIAGNOSIS — Z952 Presence of prosthetic heart valve: Secondary | ICD-10-CM | POA: Diagnosis not present

## 2021-06-30 DIAGNOSIS — Z951 Presence of aortocoronary bypass graft: Secondary | ICD-10-CM | POA: Diagnosis not present

## 2021-07-02 ENCOUNTER — Other Ambulatory Visit: Payer: Self-pay

## 2021-07-02 ENCOUNTER — Ambulatory Visit (HOSPITAL_COMMUNITY): Payer: Medicare PPO

## 2021-07-02 ENCOUNTER — Encounter (HOSPITAL_COMMUNITY)
Admission: RE | Admit: 2021-07-02 | Discharge: 2021-07-02 | Disposition: A | Discharge status: Home / Self Care | Payer: Medicare PPO | Source: Ambulatory Visit | Attending: Cardiology | Admitting: Cardiology

## 2021-07-02 DIAGNOSIS — Z951 Presence of aortocoronary bypass graft: Secondary | ICD-10-CM | POA: Diagnosis not present

## 2021-07-02 DIAGNOSIS — Z952 Presence of prosthetic heart valve: Secondary | ICD-10-CM | POA: Diagnosis not present

## 2021-07-05 ENCOUNTER — Other Ambulatory Visit: Payer: Self-pay

## 2021-07-05 ENCOUNTER — Encounter (HOSPITAL_COMMUNITY)
Admission: RE | Admit: 2021-07-05 | Discharge: 2021-07-05 | Disposition: A | Discharge status: Home / Self Care | Payer: Medicare PPO | Source: Ambulatory Visit | Attending: Cardiology | Admitting: Cardiology

## 2021-07-05 ENCOUNTER — Ambulatory Visit (HOSPITAL_COMMUNITY): Payer: Medicare PPO

## 2021-07-05 DIAGNOSIS — Z951 Presence of aortocoronary bypass graft: Secondary | ICD-10-CM | POA: Diagnosis not present

## 2021-07-05 DIAGNOSIS — Z952 Presence of prosthetic heart valve: Secondary | ICD-10-CM | POA: Diagnosis not present

## 2021-07-05 NOTE — Progress Notes (Signed)
Reviewed home exercise guidelines with patient including endpoints, temperature precautions, target heart rate and rate of perceived exertion. Patient is currently walking 1.5 miles ~23 minutes, 2-3 days/week at nearby park as his mode of home exercise. Patient has 5 lb weights at home that he is using for his resistance exercises. Patient voices understanding of instructions given. ? ?Artist Pais, MS, ACSM CEP ? ? ?

## 2021-07-06 NOTE — Progress Notes (Signed)
Cardiac Individual Treatment Plan ? ?Patient Details  ?Name: Edward [name] ?MRN: [id number] ?Date of Birth: 11-23-1945 ?Referring Provider:   ?Flowsheet Row CARDIAC REHAB PHASE II ORIENTATION from 06/08/2021 in [place]  ?Referring Provider Donato Heinz, MD  ? ?  ? ? ?Initial Encounter Date:  ?Flowsheet Row CARDIAC REHAB PHASE II ORIENTATION from 06/08/2021 in [place]  ?Date 06/08/21  ? ?  ? ? 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 ?Patient's Home Medications on Admission: ? ?Current Outpatient Medications:  ?  acetaminophen (TYLENOL) 500 MG tablet, Take 1,000 mg by mouth every 6 (six) hours as needed for moderate pain or mild pain., Disp: , Rfl:  ?  allopurinol (ZYLOPRIM) 300 MG tablet, Take 300 mg by mouth in the morning., Disp: , Rfl:  ?  aspirin EC 81 MG tablet, Take 81 mg by mouth in the morning. Swallow whole., Disp: , Rfl:  ?  atorvastatin (LIPITOR) 80 MG tablet, Take 1 tablet (80 mg total) by mouth daily. (Patient taking differently: Take 80 mg by mouth every evening.), Disp: 30 tablet, Rfl: 11 ?  Biotin 1000 MCG tablet, Take 1,000 mcg by mouth in the morning., Disp: , Rfl:  ?  carbamide peroxide (DEBROX) 6.5 % OTIC solution, 5 drops 2 (two) times a week. Wax build up, Disp: , Rfl:  ?  famotidine (PEPCID) 20 MG tablet, Take 20 mg by mouth at bedtime., Disp: , Rfl:  ?  fish oil-omega-3 fatty acids 1000 MG capsule, Take 1 g by mouth in the morning and at bedtime., Disp: , Rfl:  ?  losartan (COZAAR) 50 MG tablet, Take 1 tablet (50 mg total) by mouth daily. (Patient taking differently: Take 50 mg by mouth every evening.), Disp: 90 tablet, Rfl: 3 ?  metoprolol succinate (TOPROL XL) 25 MG 24 hr tablet, Take 1 tablet (25 mg total) by mouth daily., Disp: 90 tablet, Rfl: 1 ?  Polyethyl Glycol-Propyl Glycol (LUBRICANT EYE DROPS) 0.4-0.3 % SOLN, Place 1-2 drops into both eyes 3 (three) times daily  as needed (tired/dry/irritated eyes.)., Disp: , Rfl:  ?  predniSONE (DELTASONE) 5 MG tablet, TAKE 1 TABLET BY MOUTH EVERY DAY (Patient taking differently: Take 5 mg by mouth daily with breakfast.), Disp: 90 tablet, Rfl: 2 ?  pyridostigmine (MESTINON) 60 MG tablet, 1/2 tablet three times a day, Disp: 135 tablet, Rfl: 3 ?  traZODone (DESYREL) 50 MG tablet, TAKE 1 TABLET BY MOUTH EVERYDAY AT BEDTIME (Patient not taking: Reported on 06/03/2021), Disp: 90 tablet, Rfl: 2 ?  XARELTO 20 MG TABS tablet, Take 20 mg by mouth in the morning., Disp: , Rfl:  ? ?Past Medical History: ?Past Medical History:  ?Diagnosis Date  ? Allergic rhinitis   ? Aortic valve disease 07/27/2016  ? 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 Chronic kidney disease   ? kidney stones  ? Colon polyps   ? Coronary artery disease 2022  ? Diplopia 10/15/2015  ? Diverticulosis   ? DVT (deep venous thrombosis) ([place])   ? 2018, 2019  ? Erectile dysfunction   ? GERD (gastroesophageal reflux disease)   ? with Barrett's esophagus  ? Gout   ? Hearing loss   ? hearing aides  ? Heart murmur   ? pt says he has had it since he was a child, no issues ever mentioned  ? HTN (hypertension)   ?  Hypercholesteremia   ? Migraine headache   ? Myasthenia gravis ([place])   ? Obesity   ? OSA (obstructive sleep apnea)   ? AHI 43/hr now on CPAP at 10cm H2O  ? Peripheral neuropathy 01/14/2019  ? 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 ? ?Tobacco Use: ?Social History  ? ?Tobacco Use  ?Smoking Status Never  ?Smokeless Tobacco Never  ? ? ?Labs: ?Recent Review Flowsheet Data   ? ? Labs for ITP Cardiac and Pulmonary Rehab Latest Ref Rng & Units 03/03/2021 03/03/2021 03/03/2021 03/04/2021 04/22/2021  ? Cholestrol 100 - 199 mg/dL - - - - 128  ? LDLCALC 0 - 99 mg/dL - - - - 61  ? HDL >39 mg/dL - - - - 39(L)  ? Trlycerides 0 - 149 mg/dL - - - - 165(H)  ? Hemoglobin A1c 4.8 - 5.6 % - - - - -  ? PHART 7.350 - 7.450 7.322(L) 7.305(L) 7.384 7.378 -  ? PCO2ART 32.0 - 48.0 mmHg  47.8 45.9 38.3 36.6 -  ? HCO3 20.0 - 28.0 mmol/L 24.8 23.1 22.9 21.3 -  ? TCO2 22 - 32 mmol/L 26 25 24 22  -  ? ACIDBASEDEF 0.0 - 2.0 mmol/L 1.0 3.0(H) 2.0 3.0(H) -  ? O2SAT % 100.0 96.0 98.0 98.0 -  ? ?  ? ? ?Capillary Blood Glucose: ?Lab Results  ?Component Value Date  ? GLUCAP 114 (H) 03/12/2021  ? GLUCAP 115 (H) 03/11/2021  ? GLUCAP 129 (H) 03/11/2021  ? GLUCAP 110 (H) 03/11/2021  ? GLUCAP 99 03/11/2021  ? ? ? ?Exercise Target Goals: ?Exercise Program Goal: ?Individual exercise prescription set using results from initial 6 min walk test and THRR while considering  patient?s activity barriers and safety.  ? ?Exercise Prescription Goal: ?Initial exercise prescription builds to 30-45 minutes a day of aerobic activity, 2-3 days per week.  Home exercise guidelines will be given to patient during program as part of exercise prescription that the participant will acknowledge. ? ?Activity Barriers & Risk Stratification: ? Activity Barriers & Cardiac Risk Stratification - 06/08/21 1017   ? ?  ? Activity Barriers & Cardiac Risk Stratification  ? Activity Barriers Assistive Device;Muscular Weakness;History of Falls;Other (comment);Balance Concerns   ? Comments Peripheral neuropathy, both feet. Bilateral leg weakness from myasthenia gravis.   ? Cardiac Risk Stratification High   ? ?  ?  ? ?  ? ? ?6 Minute Walk: ? 6 Minute Walk   ? ? [place] Name 06/08/21 1001  ?  ?  ?  ? 6 Minute Walk  ? Phase Initial    ? Distance 1342 feet    ? Walk Time 6 minutes    ? # of Rest Breaks 0    ? MPH 2.54    ? METS 2.45    ? RPE 11    ? Perceived Dyspnea  0    ? VO2 Peak 8.59    ? Symptoms No    ? Resting HR 63 bpm    ? Resting BP 112/72    ? Resting Oxygen Saturation  97 %    ? Exercise Oxygen Saturation  during 6 min walk 96 %    ? Max Ex. HR 84 bpm    ? Max Ex. BP 134/62    ? 2 Minute Post BP 114/62    ? ?  ?  ? ?  ? ? ?Oxygen Initial Assessment: ? ? ?Oxygen Re-Evaluation: ? ? ?Oxygen Discharge (Final Oxygen Re-Evaluation): ? ? ?Initial  Exercise Prescription: ? Initial Exercise Prescription - 06/08/21 1400   ? ?  ? Date of Initial Exercise RX and Referring Provider  ? Date 06/08/21   ? Referring Provider Donato Heinz, MD   ? Expected Discharge Date 08/06/21   ?  ? Recumbant Bike  ? Level --   ? [place] --   ? Minutes --   ? METs --   ?  ? NuStep  ? Level 1   ? SPM 85   ? Minutes 15   ? METs 2   ?  ? Track  ? Laps 13   ? Minutes 15   ? METs 2.5   ?  ? Prescription Details  ? Frequency (times per week) 3   ? Duration Progress to 30 minutes of continuous aerobic without signs/symptoms of physical distress   ?  ? Intensity  ? THRR 40-80% of Max Heartrate 58-117   ? Ratings of Perceived Exertion 11-13   ? Perceived Dyspnea 0-4   ?  ? Progression  ? Progression Continue to progress workloads to maintain intensity without signs/symptoms of physical distress.   ?  ? Resistance Training  ? Training Prescription Yes   ? Weight 2 lbs   ? Reps 10-15   ? ?  ?  ? ?  ? ? ?Perform Capillary Blood Glucose checks as needed. ? ?Exercise Prescription Changes: ? ? Exercise Prescription Changes   ? ? [place] Name 06/14/21 1020 06/21/21 1032 07/05/21 1028  ?  ?  ?  ? Response to Exercise  ? Blood Pressure (Admit) 114/70 116/60 118/80    ? Blood Pressure (Exercise) 152/84 128/62 --    ? Blood Pressure (Exit) 118/72 100/60 102/60    ? Heart Rate (Admit) 65 bpm 65 bpm 67 bpm    ? Heart Rate (Exercise) 101 bpm 99 bpm 100 bpm    ? Heart Rate (Exit) 66 bpm 68 bpm 58 bpm    ? Rating of Perceived Exertion (Exercise) 13 13 12     ? Symptoms None Lightheaded Lightheaded    ? Comments Off to a good start with exercise. Some balance concerns during stretching. -- --    ? Duration Continue with 30 min of aerobic exercise without signs/symptoms of physical distress. Continue with 30 min of aerobic exercise without signs/symptoms of physical distress. Continue with 30 min of aerobic exercise without signs/symptoms of physical distress.    ? Intensity THRR unchanged THRR unchanged  THRR unchanged    ?  ? Progression  ? Progression Continue to progress workloads to maintain intensity without signs/symptoms of physical distress. Continue to progress workloads to maintain intensity without s

## 2021-07-07 ENCOUNTER — Other Ambulatory Visit: Payer: Self-pay

## 2021-07-07 ENCOUNTER — Ambulatory Visit (HOSPITAL_COMMUNITY): Payer: Medicare PPO

## 2021-07-07 ENCOUNTER — Encounter (HOSPITAL_COMMUNITY)
Admission: RE | Admit: 2021-07-07 | Discharge: 2021-07-07 | Disposition: A | Discharge status: Home / Self Care | Payer: Medicare PPO | Source: Ambulatory Visit | Attending: Cardiology | Admitting: Cardiology

## 2021-07-07 DIAGNOSIS — Z951 Presence of aortocoronary bypass graft: Secondary | ICD-10-CM

## 2021-07-07 DIAGNOSIS — Z952 Presence of prosthetic heart valve: Secondary | ICD-10-CM | POA: Diagnosis not present

## 2021-07-09 ENCOUNTER — Other Ambulatory Visit: Payer: Self-pay

## 2021-07-09 ENCOUNTER — Ambulatory Visit (HOSPITAL_COMMUNITY): Payer: Medicare PPO

## 2021-07-09 ENCOUNTER — Encounter (HOSPITAL_COMMUNITY)
Admission: RE | Admit: 2021-07-09 | Discharge: 2021-07-09 | Disposition: A | Discharge status: Home / Self Care | Payer: Medicare PPO | Source: Ambulatory Visit | Attending: Cardiology | Admitting: Cardiology

## 2021-07-09 DIAGNOSIS — Z951 Presence of aortocoronary bypass graft: Secondary | ICD-10-CM

## 2021-07-09 DIAGNOSIS — Z952 Presence of prosthetic heart valve: Secondary | ICD-10-CM | POA: Diagnosis not present

## 2021-07-12 ENCOUNTER — Ambulatory Visit (HOSPITAL_COMMUNITY): Payer: Medicare PPO

## 2021-07-12 ENCOUNTER — Encounter (HOSPITAL_COMMUNITY)
Admission: RE | Admit: 2021-07-12 | Discharge: 2021-07-12 | Disposition: A | Discharge status: Home / Self Care | Payer: Medicare PPO | Source: Ambulatory Visit | Attending: Cardiology | Admitting: Cardiology

## 2021-07-12 ENCOUNTER — Other Ambulatory Visit: Payer: Self-pay

## 2021-07-12 DIAGNOSIS — Z951 Presence of aortocoronary bypass graft: Secondary | ICD-10-CM | POA: Diagnosis not present

## 2021-07-12 DIAGNOSIS — Z952 Presence of prosthetic heart valve: Secondary | ICD-10-CM | POA: Diagnosis not present

## 2021-07-12 NOTE — Progress Notes (Signed)
? ? ?PATIENT: Edward [name] ?DOB: 1946/04/12 ? ?REASON FOR VISIT: Follow up ocular MG, peripheral neuropathy, bilateral foot drop ?HISTORY FROM: Patient, wife ?PRIMARY NEUROLOGIST: Dr. Marjory Lies  ? ?ASSESSMENT AND PLAN ?76 y.o. year old male  ? ?1.  Myasthenia gravis ?2.  Peripheral neuropathy ? ?-MG is relatively stable, will continue Prednisone 6 mg daily, consider decreasing back to 5 mg daily, right now feels needs the 6 mg daily while in cardiac rehab ?-Continue Mestinon 60 mg 1/2 tablet 3 times daily ?-Call for worsening symptoms, follow-up in 6 months or sooner if needed, will be followed by Dr. Marjory Lies  ? ?HISTORY OF PRESENT ILLNESS: ?Today 07/13/21 ?Edward [name] here today for follow-up. He had reduced prednisone from 6 mg to 5 mg after last visit in September, did okay. A few weeks he increased it back up to 6 mg due to weakness, a lot of exertion with cardiac rehab. Has helped. Is in cardiac rehab post CABG, valve replacement back in November, had symptoms of heart burn. Not taking trazodone, felt groggy the next day, uses CPAP at night. MG symptoms doing well, have had minimal problems with ocular symptoms. No significant weakness of arms of legs. Doing well cardiac rehab, meeting his goals, this is 5th week. No falls, has cane just in case sometimes. No trouble swallowing or chewing. Does have some trouble with short term memory, remembering names, wife thinks poor hearing plays big role. With heat, he has worse MG symptoms. ? ?HISTORY  ?01/06/2021 Dr. Anne Hahn: Edward [name] is a 76 year old right-handed white male with a history of myasthenia gravis primarily with ocular features.  The patient has a significant peripheral neuropathy associated with bilateral foot drops and some gait instability.  Within the last 6 months he has reported some heartburn type symptoms but he also he has had some right shoulder and neck discomfort with pain down the right arm.  He has seen Dr. Dutch Quint from neurosurgery, MRI of the  cervical spine was done recently, I do not have the report of this.  The patient has had some medication alterations through cardiology.  He currently is on prednisone 6 mg daily and he takes Mestinon 60 mg, 1/2 tablet 3 times daily.  He is not sleeping well.  He reports some occasional dizziness when he stands up off and on.  The heat of the summer has worsened some of his myasthenic symptoms, he does have some generalized fatigue.  He reports occasional double vision and slight ptosis.  He reports no weakness of the arms or legs or difficulties with chewing or swallowing. ? ?REVIEW OF SYSTEMS: Out of a complete 14 system review of symptoms, the patient complains only of the following symptoms, and all other reviewed systems are negative. ? ?See HPI ? ?ALLERGIES: ?No Known Allergies ? ?HOME MEDICATIONS: ?Outpatient Medications Prior to Visit  ?Medication Sig Dispense Refill  ? acetaminophen (TYLENOL) 500 MG tablet Take 1,000 mg by mouth every 6 (six) hours as needed for moderate pain or mild pain.    ? allopurinol (ZYLOPRIM) 300 MG tablet Take 300 mg by mouth in the morning.    ? aspirin EC 81 MG tablet Take 81 mg by mouth in the morning. Swallow whole.    ? atorvastatin (LIPITOR) 80 MG tablet Take 1 tablet (80 mg total) by mouth daily. (Patient taking differently: Take 80 mg by mouth every evening.) 30 tablet 11  ? Biotin 1000 MCG tablet Take 1,000 mcg by mouth in the morning.    ?  carbamide peroxide (DEBROX) 6.5 % OTIC solution 5 drops 2 (two) times a week. Wax build up    ? famotidine (PEPCID) 20 MG tablet Take 20 mg by mouth at bedtime.    ? fish oil-omega-3 fatty acids 1000 MG capsule Take 1 g by mouth in the morning and at bedtime.    ? losartan (COZAAR) 50 MG tablet Take 1 tablet (50 mg total) by mouth daily. (Patient taking differently: Take 50 mg by mouth every evening.) 90 tablet 3  ? metoprolol succinate (TOPROL XL) 25 MG 24 hr tablet Take 1 tablet (25 mg total) by mouth daily. 90 tablet 1  ? Polyethyl  Glycol-Propyl Glycol (LUBRICANT EYE DROPS) 0.4-0.3 % SOLN Place 1-2 drops into both eyes 3 (three) times daily as needed (tired/dry/irritated eyes.).    ? pyridostigmine (MESTINON) 60 MG tablet 1/2 tablet three times a day 135 tablet 3  ? XARELTO 20 MG TABS tablet Take 20 mg by mouth in the morning.    ? predniSONE (DELTASONE) 5 MG tablet TAKE 1 TABLET BY MOUTH EVERY DAY (Patient taking differently: Take 5 mg by mouth daily with breakfast.) 90 tablet 2  ? traZODone (DESYREL) 50 MG tablet TAKE 1 TABLET BY MOUTH EVERYDAY AT BEDTIME 90 tablet 2  ? ?No facility-administered medications prior to visit.  ? ? ?PAST MEDICAL HISTORY: ?Past Medical History:  ?Diagnosis Date  ? Allergic rhinitis   ? Aortic valve disease 07/27/2016  ? 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 Chronic kidney disease   ? kidney stones  ? Colon polyps   ? Coronary artery disease 2022  ? Diplopia 10/15/2015  ? Diverticulosis   ? DVT (deep venous thrombosis) ([facility])   ? 2018, 2019  ? Erectile dysfunction   ? GERD (gastroesophageal reflux disease)   ? with Barrett's esophagus  ? Gout   ? Hearing loss   ? hearing aides  ? Heart murmur   ? pt says he has had it since he was a child, no issues ever mentioned  ? HTN (hypertension)   ? Hypercholesteremia   ? Migraine headache   ? Myasthenia gravis ([facility])   ? Obesity   ? OSA (obstructive sleep apnea)   ? AHI 43/hr now on CPAP at 10cm H2O  ? Peripheral neuropathy 01/14/2019  ? 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 ? ?PAST SURGICAL HISTORY: ?Past Surgical History:  ?Procedure Laterality Date  ? AORTIC VALVE REPLACEMENT N/A 03/03/2021  ? Procedure: AORTIC VALVE REPLACEMENT (AVR) WITH BIOPROSTHETIC VALVE, Inpiris Resila Aortic Valve 25mm;  Surgeon: Corliss SkainsLightfoot, Harrell O, MD;  Location: [facility] OR;  Service: Open Heart Surgery;  Laterality: N/A;  ? CORONARY ARTERY BYPASS GRAFT N/A 03/03/2021  ? Procedure: CORONARY ARTERY BYPASS GRAFTING (CABG) x four on pump, using left internal mammary artery,  left and right endoscopic greater saphenous veins conduits;  Surgeon: Corliss SkainsLightfoot, Harrell O, MD;  Location: [facility] OR;  Service: Open Heart Surgery;  Laterality: N/A;  ? ENDOVEIN HARVEST OF GREATER SAPHENOUS VEIN Bilateral 03/03/2021  ? Procedure: ENDOVEIN HARVEST OF GREATER SAPHENOUS VEIN;  Surgeon: Corliss SkainsLightfoot, Harrell O, MD;  Location: [facility] OR;  Service: Open Heart Surgery;  Laterality: Bilateral;  ? laser assisted uvuloplasty    ? MEDIASTINAL EXPLORATION N/A 03/03/2021  ? Procedure: MEDIASTINAL EXPLORATION;  Surgeon: Corliss SkainsLightfoot, Harrell O, MD;  Location: [facility] OR;  Service: Open Heart Surgery;  Laterality: N/A;  ? RIGHT/LEFT HEART CATH AND CORONARY ANGIOGRAPHY N/A 02/11/2021  ? Procedure: RIGHT/LEFT HEART  CATH AND CORONARY ANGIOGRAPHY;  Surgeon: [place]az[place], Peter M, MD;  Location: [facility] INVASIVE CV LAB;  Service: Cardiovascular;  Laterality: N/A;  ? TEE WITHOUT CARDIOVERSION N/A 03/03/2021  ? Procedure: TRANSESOPHAGEAL ECHOCARDIOGRAM (TEE);  Surgeon: Corliss Skains, MD;  Location: [facility];  Service: Open Heart Surgery;  Laterality: N/A;  ? ? ?FAMILY HISTORY: ?Family History  ?Problem Relation Age of Onset  ? Hypertension Mother   ? Heart attack Father   ? Heart disease Father   ? Hypertension Sister   ? Heart disease Brother   ? Hypertension Brother   ? Hypertension Brother   ? Anemia Brother   ? Cancer Brother   ? ? ?SOCIAL HISTORY: ?Social History  ? ?Socioeconomic History  ? Marital status: Married  ?  Spouse name: [name]  ? Number of children: 2  ? Years of education: College  ? Highest education level: Not on file  ?Occupational History  ? Occupation: Retired  ?Tobacco Use  ? Smoking status: Never  ? Smokeless tobacco: Never  ?Vaping Use  ? Vaping Use: Never used  ?Substance and Sexual Activity  ? Alcohol use: Not Currently  ? Drug use: No  ? Sexual activity: Not on file  ?Other Topics Concern  ? Not on file  ?Social History Narrative  ? Lives home w/ his wife  ? Right-handed   ? Drinks about 3 cups of caffeine per day   ? ?Social Determinants of Health  ? ?Financial Resource Strain: Not on file  ?Food Insecurity: Not on file  ?Transportation Needs: Not on file  ?Physical Activity: Not on file  ?Stress: Not on file  ?Social Co

## 2021-07-13 ENCOUNTER — Ambulatory Visit (INDEPENDENT_AMBULATORY_CARE_PROVIDER_SITE_OTHER): Payer: Medicare PPO | Admitting: Neurology

## 2021-07-13 ENCOUNTER — Encounter: Payer: Self-pay | Admitting: Neurology

## 2021-07-13 VITALS — BP 125/70 | HR 50 | Wt 199.0 lb

## 2021-07-13 DIAGNOSIS — G603 Idiopathic progressive neuropathy: Secondary | ICD-10-CM

## 2021-07-13 DIAGNOSIS — G7000 Myasthenia gravis without (acute) exacerbation: Secondary | ICD-10-CM | POA: Diagnosis not present

## 2021-07-13 MED ORDER — PREDNISONE 5 MG PO TABS: 5.0000 mg | ORAL_TABLET | Freq: Every day | ORAL | 2 refills | Status: DC

## 2021-07-13 MED ORDER — PREDNISONE 1 MG PO TABS: 1.0000 mg | ORAL_TABLET | Freq: Every day | ORAL | 1 refills | Status: DC

## 2021-07-13 NOTE — Patient Instructions (Signed)
Great to see today  ?Continue current dose of prednisone 6 mg daily, may try to reduce back to 5 mg  ?Monitor for symptoms  ?See you back in 6 months, will see Dr. Marjory Lies  ?

## 2021-07-14 ENCOUNTER — Encounter (HOSPITAL_COMMUNITY)
Admission: RE | Admit: 2021-07-14 | Discharge: 2021-07-14 | Disposition: A | Discharge status: Home / Self Care | Payer: Medicare PPO | Source: Ambulatory Visit | Attending: Cardiology | Admitting: Cardiology

## 2021-07-14 ENCOUNTER — Other Ambulatory Visit: Payer: Self-pay

## 2021-07-14 ENCOUNTER — Ambulatory Visit (HOSPITAL_COMMUNITY): Payer: Medicare PPO

## 2021-07-14 DIAGNOSIS — Z951 Presence of aortocoronary bypass graft: Secondary | ICD-10-CM | POA: Diagnosis not present

## 2021-07-14 DIAGNOSIS — Z952 Presence of prosthetic heart valve: Secondary | ICD-10-CM | POA: Diagnosis not present

## 2021-07-16 ENCOUNTER — Other Ambulatory Visit: Payer: Self-pay

## 2021-07-16 ENCOUNTER — Encounter (HOSPITAL_COMMUNITY)
Admission: RE | Admit: 2021-07-16 | Discharge: 2021-07-16 | Disposition: A | Discharge status: Home / Self Care | Payer: Medicare PPO | Source: Ambulatory Visit | Attending: Cardiology | Admitting: Cardiology

## 2021-07-16 ENCOUNTER — Ambulatory Visit (HOSPITAL_COMMUNITY): Payer: Medicare PPO

## 2021-07-16 DIAGNOSIS — Z952 Presence of prosthetic heart valve: Secondary | ICD-10-CM

## 2021-07-16 DIAGNOSIS — Z951 Presence of aortocoronary bypass graft: Secondary | ICD-10-CM

## 2021-07-19 ENCOUNTER — Other Ambulatory Visit: Payer: Self-pay

## 2021-07-19 ENCOUNTER — Encounter (HOSPITAL_COMMUNITY)
Admission: RE | Admit: 2021-07-19 | Discharge: 2021-07-19 | Disposition: A | Discharge status: Home / Self Care | Payer: Medicare PPO | Source: Ambulatory Visit | Attending: Cardiology | Admitting: Cardiology

## 2021-07-19 ENCOUNTER — Ambulatory Visit (HOSPITAL_COMMUNITY): Payer: Medicare PPO

## 2021-07-19 DIAGNOSIS — Z951 Presence of aortocoronary bypass graft: Secondary | ICD-10-CM | POA: Diagnosis not present

## 2021-07-19 DIAGNOSIS — Z952 Presence of prosthetic heart valve: Secondary | ICD-10-CM

## 2021-07-21 ENCOUNTER — Other Ambulatory Visit: Payer: Self-pay

## 2021-07-21 ENCOUNTER — Ambulatory Visit (HOSPITAL_COMMUNITY): Payer: Medicare PPO

## 2021-07-21 ENCOUNTER — Encounter (HOSPITAL_COMMUNITY)
Admission: RE | Admit: 2021-07-21 | Discharge: 2021-07-21 | Disposition: A | Discharge status: Home / Self Care | Payer: Medicare PPO | Source: Ambulatory Visit | Attending: Cardiology | Admitting: Cardiology

## 2021-07-21 DIAGNOSIS — Z951 Presence of aortocoronary bypass graft: Secondary | ICD-10-CM | POA: Diagnosis not present

## 2021-07-21 DIAGNOSIS — Z952 Presence of prosthetic heart valve: Secondary | ICD-10-CM

## 2021-07-23 ENCOUNTER — Ambulatory Visit (HOSPITAL_COMMUNITY): Payer: Medicare PPO

## 2021-07-23 ENCOUNTER — Encounter (HOSPITAL_COMMUNITY)
Admission: RE | Admit: 2021-07-23 | Discharge: 2021-07-23 | Disposition: A | Discharge status: Home / Self Care | Payer: Medicare PPO | Source: Ambulatory Visit | Attending: Cardiology | Admitting: Cardiology

## 2021-07-23 DIAGNOSIS — Z952 Presence of prosthetic heart valve: Secondary | ICD-10-CM | POA: Diagnosis not present

## 2021-07-23 DIAGNOSIS — Z951 Presence of aortocoronary bypass graft: Secondary | ICD-10-CM | POA: Diagnosis not present

## 2021-07-26 ENCOUNTER — Ambulatory Visit (HOSPITAL_COMMUNITY): Payer: Medicare PPO

## 2021-07-26 ENCOUNTER — Encounter (HOSPITAL_COMMUNITY)
Admission: RE | Admit: 2021-07-26 | Discharge: 2021-07-26 | Disposition: A | Discharge status: Home / Self Care | Payer: Medicare PPO | Source: Ambulatory Visit | Attending: Cardiology | Admitting: Cardiology

## 2021-07-26 DIAGNOSIS — Z951 Presence of aortocoronary bypass graft: Secondary | ICD-10-CM | POA: Insufficient documentation

## 2021-07-26 DIAGNOSIS — Z952 Presence of prosthetic heart valve: Secondary | ICD-10-CM | POA: Insufficient documentation

## 2021-07-26 DIAGNOSIS — Z48812 Encounter for surgical aftercare following surgery on the circulatory system: Secondary | ICD-10-CM | POA: Insufficient documentation

## 2021-07-28 ENCOUNTER — Encounter (HOSPITAL_COMMUNITY)
Admission: RE | Admit: 2021-07-28 | Discharge: 2021-07-28 | Disposition: A | Discharge status: Home / Self Care | Payer: Medicare PPO | Source: Ambulatory Visit | Attending: Cardiology | Admitting: Cardiology

## 2021-07-28 ENCOUNTER — Ambulatory Visit (HOSPITAL_COMMUNITY): Payer: Medicare PPO

## 2021-07-28 DIAGNOSIS — Z951 Presence of aortocoronary bypass graft: Secondary | ICD-10-CM | POA: Diagnosis not present

## 2021-07-28 DIAGNOSIS — Z952 Presence of prosthetic heart valve: Secondary | ICD-10-CM

## 2021-07-28 DIAGNOSIS — Z48812 Encounter for surgical aftercare following surgery on the circulatory system: Secondary | ICD-10-CM | POA: Diagnosis not present

## 2021-07-30 ENCOUNTER — Ambulatory Visit (HOSPITAL_COMMUNITY): Payer: Medicare PPO

## 2021-07-30 ENCOUNTER — Encounter (HOSPITAL_COMMUNITY)
Admission: RE | Admit: 2021-07-30 | Discharge: 2021-07-30 | Disposition: A | Discharge status: Home / Self Care | Payer: Medicare PPO | Source: Ambulatory Visit | Attending: Cardiology | Admitting: Cardiology

## 2021-07-30 VITALS — BP 130/70 | HR 73 | Wt 199.5 lb

## 2021-07-30 DIAGNOSIS — Z951 Presence of aortocoronary bypass graft: Secondary | ICD-10-CM

## 2021-07-30 DIAGNOSIS — Z952 Presence of prosthetic heart valve: Secondary | ICD-10-CM

## 2021-07-30 DIAGNOSIS — Z48812 Encounter for surgical aftercare following surgery on the circulatory system: Secondary | ICD-10-CM | POA: Diagnosis not present

## 2021-08-02 ENCOUNTER — Encounter (HOSPITAL_COMMUNITY)
Admission: RE | Admit: 2021-08-02 | Discharge: 2021-08-02 | Disposition: A | Discharge status: Home / Self Care | Payer: Medicare PPO | Source: Ambulatory Visit | Attending: Cardiology | Admitting: Cardiology

## 2021-08-02 DIAGNOSIS — Z952 Presence of prosthetic heart valve: Secondary | ICD-10-CM

## 2021-08-02 DIAGNOSIS — Z48812 Encounter for surgical aftercare following surgery on the circulatory system: Secondary | ICD-10-CM | POA: Diagnosis not present

## 2021-08-02 DIAGNOSIS — Z951 Presence of aortocoronary bypass graft: Secondary | ICD-10-CM

## 2021-08-03 NOTE — Progress Notes (Signed)
Cardiac Individual Treatment Plan ? ?Patient Details  ?Name: Edward [name] ?MRN: [id number] ?Date of Birth: 1946-04-19 ?Referring Provider:   ?Flowsheet Row CARDIAC REHAB PHASE II ORIENTATION from 06/08/2021 in [facility] CARDIAC REHAB  ?Referring Provider Little Ishikawa, MD  ? ?  ? ? ?Initial Encounter Date:  ?Flowsheet Row CARDIAC REHAB PHASE II ORIENTATION from 06/08/2021 in [facility] CARDIAC REHAB  ?Date 06/08/21  ? ?  ? ? 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 ?Patient's Home Medications on Admission: ? ?Current Outpatient Medications:  ?  acetaminophen (TYLENOL) 500 MG tablet, Take 1,000 mg by mouth every 6 (six) hours as needed for moderate pain or mild pain., Disp: , Rfl:  ?  allopurinol (ZYLOPRIM) 300 MG tablet, Take 300 mg by mouth in the morning., Disp: , Rfl:  ?  aspirin EC 81 MG tablet, Take 81 mg by mouth in the morning. Swallow whole., Disp: , Rfl:  ?  atorvastatin (LIPITOR) 80 MG tablet, Take 1 tablet (80 mg total) by mouth daily. (Patient taking differently: Take 80 mg by mouth every evening.), Disp: 30 tablet, Rfl: 11 ?  Biotin 1000 MCG tablet, Take 1,000 mcg by mouth in the morning., Disp: , Rfl:  ?  carbamide peroxide (DEBROX) 6.5 % OTIC solution, 5 drops 2 (two) times a week. Wax build up, Disp: , Rfl:  ?  famotidine (PEPCID) 20 MG tablet, Take 20 mg by mouth at bedtime., Disp: , Rfl:  ?  fish oil-omega-3 fatty acids 1000 MG capsule, Take 1 g by mouth in the morning and at bedtime., Disp: , Rfl:  ?  losartan (COZAAR) 50 MG tablet, Take 1 tablet (50 mg total) by mouth daily. (Patient taking differently: Take 50 mg by mouth every evening.), Disp: 90 tablet, Rfl: 3 ?  metoprolol succinate (TOPROL XL) 25 MG 24 hr tablet, Take 1 tablet (25 mg total) by mouth daily., Disp: 90 tablet, Rfl: 1 ?  Polyethyl Glycol-Propyl Glycol (LUBRICANT EYE DROPS) 0.4-0.3 % SOLN, Place 1-2 drops into both eyes 3 (three) times daily  as needed (tired/dry/irritated eyes.)., Disp: , Rfl:  ?  predniSONE (DELTASONE) 1 MG tablet, Take 1 tablet (1 mg total) by mouth daily with breakfast., Disp: 90 tablet, Rfl: 1 ?  predniSONE (DELTASONE) 5 MG tablet, Take 1 tablet (5 mg total) by mouth daily. Take 1 tablet daily, Disp: 90 tablet, Rfl: 2 ?  pyridostigmine (MESTINON) 60 MG tablet, 1/2 tablet three times a day, Disp: 135 tablet, Rfl: 3 ?  XARELTO 20 MG TABS tablet, Take 20 mg by mouth in the morning., Disp: , Rfl:  ? ?Past Medical History: ?Past Medical History:  ?Diagnosis Date  ? Allergic rhinitis   ? Aortic valve disease 07/27/2016  ? 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 Chronic kidney disease   ? kidney stones  ? Colon polyps   ? Coronary artery disease 2022  ? Diplopia 10/15/2015  ? Diverticulosis   ? DVT (deep venous thrombosis) (HCC)   ? 2018, 2019  ? Erectile dysfunction   ? GERD (gastroesophageal reflux disease)   ? with Barrett's esophagus  ? Gout   ? Hearing loss   ? hearing aides  ? Heart murmur   ? pt says he has had it since he was a child, no issues ever mentioned  ? HTN (hypertension)   ? Hypercholesteremia   ? Migraine headache   ?  Myasthenia gravis (HCC)   ? Obesity   ? OSA (obstructive sleep apnea)   ? AHI 43/hr now on CPAP at 10cm H2O  ? Peripheral neuropathy 01/14/2019  ? 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 ? ?Tobacco Use: ?Social History  ? ?Tobacco Use  ?Smoking Status Never  ?Smokeless Tobacco Never  ? ? ?Labs: ?Review Flowsheet   ? ?  ?  Latest Ref Rng & Units 02/11/2021 03/01/2021 03/03/2021 03/04/2021  ?Labs for ITP Cardiac and Pulmonary Rehab  ?Cholestrol 100 - 199 mg/dL      ?LDL (calc) 0 - 99 mg/dL      ?HDL-C >39 mg/dL      ?Trlycerides 0 - 149 mg/dL      ?Hemoglobin A1c 4.8 - 5.6 %  6.2      ?PH, Arterial 7.350 - 7.450 7.342   7.387   7.384    ? 7.305    ? 7.322    ? 7.366    ? 7.385    ? 7.422    ? 7.330    ? 7.358   7.378    ?PCO2 arterial 32.0 - 48.0 mmHg 43.2   38.0   38.3    ? 45.9    ?  47.8    ? 41.1    ? 43.3    ? 38.2    ? 48.1    ? 44.3   36.6    ?Bicarbonate 20.0 - 28.0 mmol/L 25.1    ? 25.3    ? 23.5   22.3   22.9    ? 23.1    ? 24.8    ? 23.5    ? 25.9    ? 24.9    ? 25.4    ? 24.9   21.3    ?TCO2 22 - 32 mmol/L 27    ? 27    ? 25    24    ? 25    ? 26    ? 25    ? 27    ? 26    ? 27    ? 29    ? 29    ? 25    ? 26    ? 27    ? 26    ? 26    ? 24   22    ?Acid-base deficit 0.0 - 2.0 mmol/L 2.0    ? 2.0    ? 2.0   1.9   2.0    ? 3.0    ? 1.0    ? 2.0    ? 1.0    ? 1.0   3.0    ?O2 Saturation % 73.0    ? 75.0    ? 98.0   97.5   98.0    ? 96.0    ? 100.0    ? 100.0    ? 96.0    ? 100.0    ? 75.0    ? 100.0   98.0    ? ?  04/22/2021  ?Labs for ITP Cardiac and Pulmonary Rehab  ?Cholestrol 128    ?LDL (calc) 61    ?HDL-C 39    ?Trlycerides 165    ?Hemoglobin A1c   ?PH, Arterial   ?PCO2 arterial   ?Bicarbonate   ?TCO2   ?Acid-base deficit   ?O2 Saturation   ?  ? ? Multiple values from one day are sorted in reverse-chronological order  ?  ?  ? ? ?  Capillary Blood Glucose: ?Lab Results  ?Component Value Date  ? GLUCAP 114 (H) 03/12/2021  ? GLUCAP 115 (H) 03/11/2021  ? GLUCAP 129 (H) 03/11/2021  ? GLUCAP 110 (H) 03/11/2021  ? GLUCAP 99 03/11/2021  ? ? ? ?Exercise Target Goals: ?Exercise Program Goal: ?Individual exercise prescription set using results from initial 6 min walk test and THRR while considering  patient?s activity barriers and safety.  ? ?Exercise Prescription Goal: ?Initial exercise prescription builds to 30-45 minutes a day of aerobic activity, 2-3 days per week.  Home exercise guidelines will be given to patient during program as part of exercise prescription that the participant will acknowledge. ? ?Activity Barriers & Risk Stratification: ? Activity Barriers & Cardiac Risk Stratification - 06/08/21 1017   ? ?  ? Activity Barriers & Cardiac Risk Stratification  ? Activity Barriers Assistive Device;Muscular Weakness;History of Falls;Other (comment);Balance Concerns   ? Comments  Peripheral neuropathy, both feet. Bilateral leg weakness from myasthenia gravis.   ? Cardiac Risk Stratification High   ? ?  ?  ? ?  ? ? ?6 Minute Walk: ? 6 Minute Walk   ? ? Row Name 06/08/21 1001 08/02/21 1039  ?  ?  ? 6 Minute Walk  ? Phase Initial Discharge   ? Distance 1342 feet 1551 feet   ? Distance % Change -- 15.57 %   ? Distance Feet Change -- 209 ft   ? Walk Time 6 minutes 6 minutes   ? # of Rest Breaks 0 0   ? MPH 2.54 2.94   ? METS 2.45 3.18   ? RPE 11 11   ? Perceived Dyspnea  0 0   ? VO2 Peak 8.59 11.13   ? Symptoms No No   ? Resting HR 63 bpm 57 bpm   ? Resting BP 112/72 140/60   ? Resting Oxygen Saturation  97 % 97 %   ? Exercise Oxygen Saturation  during 6 min walk 96 % 95 %   ? Max Ex. HR 84 bpm 104 bpm   ? Max Ex. BP 134/62 158/82   ? 2 Minute Post BP 114/62 112/72   ? ?  ?  ? ?  ? ? ?Oxygen Initial Assessment: ? ? ?Oxygen Re-Evaluation: ? ? ?Oxygen Discharge (Final Oxygen Re-Evaluation): ? ? ?Initial Exercise Prescription: ? Initial Exercise Prescription - 06/08/21 1400   ? ?  ? Date of Initial Exercise RX and Referring Provider  ? Date 06/08/21   ? Referring Provider Little Ishikawa, MD   ? Expected Discharge Date 08/06/21   ?  ? Recumbant Bike  ? Level --   ? Watts --   ? Minutes --   ? METs --   ?  ? NuStep  ? Level 1   ? SPM 85   ? Minutes 15   ? METs 2   ?  ? Track  ? Laps 13   ? Minutes 15   ? METs 2.5   ?  ? Prescription Details  ? Frequency (times per week) 3   ? Duration Progress to 30 minutes of continuous aerobic without signs/symptoms of physical distress   ?  ? Intensity  ? THRR 40-80% of Max Heartrate 58-117   ? Ratings of Perceived Exertion 11-13   ? Perceived Dyspnea 0-4   ?  ? Progression  ? Progression Continue to progress workloads to maintain intensity without signs/symptoms of physical distress.   ?  ? Resistance Training  ? Training  Prescription Yes   ? Weight 2 lbs   ? Reps 10-15   ? ?  ?  ? ?  ? ? ?Perform Capillary Blood Glucose checks as needed. ? ?Exercise  Prescription Changes: ? ? Exercise Prescription Changes   ? ? Row Name 06/14/21 1020 06/21/21 1032 07/05/21 1028 07/19/21 1025 08/02/21 1031  ?  ? Response to Exercise  ? Blood Pressure (Admit) 114/70 116/60 118/8

## 2021-08-04 ENCOUNTER — Encounter (HOSPITAL_COMMUNITY)
Admission: RE | Admit: 2021-08-04 | Discharge: 2021-08-04 | Disposition: A | Discharge status: Home / Self Care | Payer: Medicare PPO | Source: Ambulatory Visit | Attending: Cardiology | Admitting: Cardiology

## 2021-08-04 DIAGNOSIS — Z951 Presence of aortocoronary bypass graft: Secondary | ICD-10-CM | POA: Diagnosis not present

## 2021-08-04 DIAGNOSIS — Z48812 Encounter for surgical aftercare following surgery on the circulatory system: Secondary | ICD-10-CM | POA: Diagnosis not present

## 2021-08-04 DIAGNOSIS — Z952 Presence of prosthetic heart valve: Secondary | ICD-10-CM | POA: Diagnosis not present

## 2021-08-04 DIAGNOSIS — M79672 Pain in left foot: Secondary | ICD-10-CM | POA: Diagnosis not present

## 2021-08-04 DIAGNOSIS — L600 Ingrowing nail: Secondary | ICD-10-CM | POA: Diagnosis not present

## 2021-08-04 DIAGNOSIS — M79671 Pain in right foot: Secondary | ICD-10-CM | POA: Diagnosis not present

## 2021-08-06 ENCOUNTER — Encounter (HOSPITAL_COMMUNITY)
Admission: RE | Admit: 2021-08-06 | Discharge: 2021-08-06 | Disposition: A | Discharge status: Home / Self Care | Payer: Medicare PPO | Source: Ambulatory Visit | Attending: Cardiology | Admitting: Cardiology

## 2021-08-06 VITALS — BP 142/84 | HR 65 | Ht 67.5 in | Wt 198.6 lb

## 2021-08-06 DIAGNOSIS — Z952 Presence of prosthetic heart valve: Secondary | ICD-10-CM

## 2021-08-06 DIAGNOSIS — Z951 Presence of aortocoronary bypass graft: Secondary | ICD-10-CM | POA: Diagnosis not present

## 2021-08-06 DIAGNOSIS — Z48812 Encounter for surgical aftercare following surgery on the circulatory system: Secondary | ICD-10-CM | POA: Diagnosis not present

## 2021-08-06 NOTE — Progress Notes (Signed)
Discharge Progress Report ? ?Patient Details  ?Name: Edward [name] ?MRN: [id number] ?Date of Birth: [birth year] ?Referring Provider:   ?Flowsheet Row CARDIAC REHAB PHASE II ORIENTATION from 06/08/2021 in [place]  ?Referring Provider Donato Heinz, MD  ? ?  ? ? ? ?Number of Visits: 24 ? ?Reason for Discharge:  ?Patient reached a stable level of exercise. ?Patient independent in their exercise. ?Patient has met program and personal goals. ? ?Smoking History:  ?Social History  ? ?Tobacco Use  ?Smoking Status Never  ?Smokeless Tobacco Never  ? ? 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 ?ADL UCSD: ? ? ?Initial Exercise Prescription: ? Initial Exercise Prescription - 06/08/21 1400   ? ?  ? Date of Initial Exercise RX and Referring Provider  ? Date 06/08/21   ? Referring Provider Donato Heinz, MD   ? Expected Discharge Date 08/06/21   ?  ? Recumbant Bike  ? Level --   ? Watts --   ? Minutes --   ? METs --   ?  ? NuStep  ? Level 1   ? SPM 85   ? Minutes 15   ? METs 2   ?  ? Track  ? Laps 13   ? Minutes 15   ? METs 2.5   ?  ? Prescription Details  ? Frequency (times per week) 3   ? Duration Progress to 30 minutes of continuous aerobic without signs/symptoms of physical distress   ?  ? Intensity  ? THRR 40-80% of Max Heartrate 58-117   ? Ratings of Perceived Exertion 11-13   ? Perceived Dyspnea 0-4   ?  ? Progression  ? Progression Continue to progress workloads to maintain intensity without signs/symptoms of physical distress.   ?  ? Resistance Training  ? Training Prescription Yes   ? Weight 2 lbs   ? Reps 10-15   ? ?  ?  ? ?  ? ? ?Discharge Exercise Prescription (Final Exercise Prescription Changes): ? Exercise Prescription Changes - 08/06/21 1028   ? ?  ? Response to Exercise  ? Blood Pressure (Admit) 142/84   ? Blood Pressure (Exercise) 138/70   ? Blood Pressure (Exit) 114/76   ? Heart Rate (Admit) 65 bpm   ? Heart Rate (Exercise)  94 bpm   ? Heart Rate (Exit) 61 bpm   ? Rating of Perceived Exertion (Exercise) 9   ? Symptoms None   ? Comments Last session of cardiac rehab today. Patient had ingrown toe nail removed. Advised not to walk track today.   ? Duration Continue with 30 min of aerobic exercise without signs/symptoms of physical distress.   ? Intensity THRR unchanged   ?  ? Progression  ? Progression Continue to progress workloads to maintain intensity without signs/symptoms of physical distress.   ? Average METs 2.7   ?  ? Resistance Training  ? Training Prescription Yes   ? Weight 4 lbs   ? Reps 10-15   ? Time 10 Minutes   ?  ? Interval Training  ? Interval Training No   ?  ? NuStep  ? Level 4   ? SPM 84   ? Minutes 30   ? METs 2.7   ?  ? Home Exercise Plan  ? Plans to continue exercise at Home (comment)   Walking  ? Frequency Add 3 additional days to program exercise sessions.   ?  Initial Home Exercises Provided 07/05/21   ? ?  ?  ? ?  ? ? ?Functional Capacity: ? 6 Minute Walk   ? ? [place] Name 06/08/21 1001 08/02/21 1039  ?  ?  ? 6 Minute Walk  ? Phase Initial Discharge   ? Distance 1342 feet 1551 feet   ? Distance % Change -- 15.57 %   ? Distance Feet Change -- 209 ft   ? Walk Time 6 minutes 6 minutes   ? # of Rest Breaks 0 0   ? MPH 2.54 2.94   ? METS 2.45 3.18   ? RPE 11 11   ? Perceived Dyspnea  0 0   ? VO2 Peak 8.59 11.13   ? Symptoms No No   ? Resting HR 63 bpm 57 bpm   ? Resting BP 112/72 140/60   ? Resting Oxygen Saturation  97 % 97 %   ? Exercise Oxygen Saturation  during 6 min walk 96 % 95 %   ? Max Ex. HR 84 bpm 104 bpm   ? Max Ex. BP 134/62 158/82   ? 2 Minute Post BP 114/62 112/72   ? ?  ?  ? ?  ? ? ?Psychological, QOL, Others - Outcomes: ?PHQ 2/9: ? ?  08/06/2021  ? 11:40 AM 06/08/2021  ?  1:15 PM  ?Depression screen PHQ 2/9  ?Decreased Interest 0 0  ?Down, Depressed, Hopeless 0 0  ?PHQ - 2 Score 0 0  ? ? ?Quality of Life: ? Quality of Life - 07/27/21 1642   ? ?  ? Quality of Life  ? Select Quality of Life   ?  ? Quality of  Life Scores  ? Health/Function Pre 22.77 %   ? Health/Function Post 22.5 %   ? Health/Function % Change -1.19 %   ? Socioeconomic Pre 24.67 %   ? Socioeconomic Post 26 %   ? Socioeconomic % Change  5.39 %   ? Psych/Spiritual Pre 25.43 %   ? Psych/Spiritual Post 21.93 %   ? Psych/Spiritual % Change -13.76 %   ? Family Pre 28.8 %   ? Family Post 27.1 %   ? Family % Change -5.9 %   ? GLOBAL Pre 24.59 %   ? GLOBAL Post 23.71 %   ? GLOBAL % Change -3.58 %   ? ?  ?  ? ?  ? ? ?Personal Goals: ?Goals established at orientation with interventions provided to work toward goal. ? Personal Goals and Risk Factors at Admission - 06/08/21 1313   ? ?  ? Core Components/Risk Factors/Patient Goals on Admission  ?  Weight Management Yes;Weight Loss;Obesity   ? Intervention Weight Management: Develop a combined nutrition and exercise program designed to reach desired caloric intake, while maintaining appropriate intake of nutrient and fiber, sodium and fats, and appropriate energy expenditure required for the weight goal.;Weight Management: Provide education and appropriate resources to help participant work on and attain dietary goals.;Weight Management/Obesity: Establish reasonable short term and long term weight goals.   ? Admit Weight 197 lb 5 oz (89.5 kg)   ? Goal Weight: Short Term 193 lb (87.5 kg)   ? Goal Weight: Long Term 180 lb (81.6 kg)   ? Expected Outcomes Short Term: Continue to assess and modify interventions until short term weight is achieved;Long Term: Adherence to nutrition and physical activity/exercise program aimed toward attainment of established weight goal;Weight Loss: Understanding of general recommendations for a balanced deficit meal plan,  which promotes 1-2 lb weight loss per week and includes a negative energy balance of [phone] kcal/d;Understanding recommendations for meals to include 15-35% energy as protein, 25-35% energy from fat, 35-60% energy from carbohydrates, less than 257m of dietary  cholesterol, 20-35 gm of total fiber daily;Understanding of distribution of calorie intake throughout the day with the consumption of 4-5 meals/snacks   ? Hypertension Yes   ? Intervention Provide education on lifestyle modifcations including regular physical activity/exercise, weight management, moderate sodium restriction and increased consumption of fresh fruit, vegetables, and low fat dairy, alcohol moderation, and smoking cessation.;Monitor prescription use compliance.   ? Expected Outcomes Short Term: Continued assessment and intervention until BP is < 140/962mHG in hypertensive participants. < 130/8010mG in hypertensive participants with diabetes, heart failure or chronic kidney disease.;Long Term: Maintenance of blood pressure at goal levels.   ? Lipids Yes   ? Intervention Provide education and support for participant on nutrition & aerobic/resistive exercise along with prescribed medications to achieve LDL <77m60mDL >40mg47m? Expected Outcomes Short Term: Participant states understanding of desired cholesterol values and is compliant with medications prescribed. Participant is following exercise prescription and nutrition guidelines.;Long Term: Cholesterol controlled with medications as prescribed, with individualized exercise RX and with personalized nutrition plan. Value goals: LDL < 77mg,22m > 40 mg.   ? Stress Yes   ? Intervention Offer individual and/or small group education and counseling on adjustment to heart disease, stress management and health-related lifestyle change. Teach and support self-help strategies.;Refer participants experiencing significant psychosocial distress to appropriate mental health specialists for further evaluation and treatment. When possible, include family members and significant others in education/counseling sessions.   ? Expected Outcomes Short Term: Participant demonstrates changes in health-related behavior, relaxation and other stress management skills, ability  to obtain effective social support, and compliance with psychotropic medications if prescribed.;Long Term: Emotional wellbeing is indicated by absence of clinically significant psychosocial distress or socia

## 2021-08-08 NOTE — Progress Notes (Signed)
?Cardiology Office Note:   ? ?Date:  08/11/2021  ? ?ID:  Edward [name], DOB 04-05-1946, MRN [id number] ? ?PCP:  Edward Pepper, MD  ?Cardiologist:  Edward Heinz, MD  ?Electrophysiologist:  Edward Epley, MD  ? ?Referring MD: Edward Pepper, MD  ? ?Chief Complaint  ?Patient presents with  ? Follow-up  ? Coronary Artery Disease  ? ? ? ?History of Present Illness:   ? ?Edward [name] is a 76 y.o. male with a hx of aortic stenosis, hypertension, hyperlipidemia, myasthenia gravis, OSA, DVT who presents for follow-up.  He was referred by Dr. Orland [name] for evaluation of dyspnea and chest pain, initially seen on 09/29/2020. ? ?Echocardiogram 07/27/2017 showed LVEF 60 to [id number], grade 1 diastolic dysfunction, functionally bicuspid aortic valve with mild to moderate aortic stenosis and mild aortic regurgitation, mild MR, mild TR. Echocardiogram on 10/14/2020 showed normal biventricular function, mild LVH, mild to moderate aortic stenosis, mild aortic regurgitation, mild mitral regurgitation.  Lexiscan Myoview on 11/05/2020 showed fixed inferior defect likely representing artifact, no ischemia, EF 52%.  Calcium score in 11/30/2020 was 674 (72nd percentile).  Continued to have chest pain and underwent cath on 02/11/2021 which showed severe multivessel CAD, moderate AS, normal filling pressures.  Underwent CABG x4 (LIMA-LAD, SVG-PDA, SVG-OM, SVG-D2 Y graft off OM), aortic valve replacement with 25 mm Inspiris Resilia on 03/03/2021.  Postop course was complicated by A. fib which was managed with amiodarone and beta-blocker.  Also developed thrombocytopenia down to 62 but recovery without intervention.  Echocardiogram on 04/30/21 showed EF 50 to 55%, mildly reduced RV function, normal functioning bioprosthetic aortic valve. ? ?Since last clinic visit, he reports that he is doing well.  Finished cardiac rehab.  Denies any chest pain, dyspnea, lightheadedness, syncope, lower extremity edema, or palpitations.  Has lost 25 pounds since his  surgery.  Reports compliance with CPAP. ? ? ?Wt Readings from Last 3 Encounters:  ?08/11/21 199 lb 9.6 oz (90.5 kg)  ?08/06/21 198 lb 10.2 oz (90.1 kg)  ?07/13/21 199 lb (90.3 kg)  ? ? ? ? ?Past Medical History:  ?Diagnosis Date  ? Allergic rhinitis   ? Aortic valve disease 07/27/2016  ? 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 Chronic kidney disease   ? kidney stones  ? Colon polyps   ? Coronary artery disease 2022  ? Diplopia 10/15/2015  ? Diverticulosis   ? DVT (deep venous thrombosis) ([place])   ? 2018, 2019  ? Erectile dysfunction   ? GERD (gastroesophageal reflux disease)   ? with Barrett's esophagus  ? Gout   ? Hearing loss   ? hearing aides  ? Heart murmur   ? pt says he has had it since he was a child, no issues ever mentioned  ? HTN (hypertension)   ? Hypercholesteremia   ? Migraine headache   ? Myasthenia gravis ([place])   ? Obesity   ? OSA (obstructive sleep apnea)   ? AHI 43/hr now on CPAP at 10cm H2O  ? Peripheral neuropathy 01/14/2019  ? 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 ? ?Past Surgical History:  ?Procedure Laterality Date  ? AORTIC VALVE REPLACEMENT N/A 03/03/2021  ? Procedure: AORTIC VALVE REPLACEMENT (AVR) WITH BIOPROSTHETIC VALVE, Inpiris Resila Aortic Valve 4mm;  Surgeon: Lajuana Matte, MD;  Location: [facility];  Service: Open Heart Surgery;  Laterality: N/A;  ? CORONARY ARTERY BYPASS GRAFT N/A 03/03/2021  ? Procedure: CORONARY ARTERY BYPASS GRAFTING (CABG) x  four on pump, using left internal mammary artery, left and right endoscopic greater saphenous veins conduits;  Surgeon: Lajuana Matte, MD;  Location: [facility];  Service: Open Heart Surgery;  Laterality: N/A;  ? ENDOVEIN HARVEST OF GREATER SAPHENOUS VEIN Bilateral 03/03/2021  ? Procedure: ENDOVEIN HARVEST OF GREATER SAPHENOUS VEIN;  Surgeon: Lajuana Matte, MD;  Location: [facility];  Service: Open Heart Surgery;  Laterality: Bilateral;  ? laser assisted uvuloplasty    ? MEDIASTINAL EXPLORATION N/A 03/03/2021   ? Procedure: MEDIASTINAL EXPLORATION;  Surgeon: Lajuana Matte, MD;  Location: [facility];  Service: Open Heart Surgery;  Laterality: N/A;  ? RIGHT/LEFT HEART CATH AND CORONARY ANGIOGRAPHY N/A 02/11/2021  ? Procedure: RIGHT/LEFT HEART CATH AND CORONARY ANGIOGRAPHY;  Surgeon: Martinique, Peter M, MD;  Location: [facility];  Service: Cardiovascular;  Laterality: N/A;  ? TEE WITHOUT CARDIOVERSION N/A 03/03/2021  ? Procedure: TRANSESOPHAGEAL ECHOCARDIOGRAM (TEE);  Surgeon: Lajuana Matte, MD;  Location: [facility];  Service: Open Heart Surgery;  Laterality: N/A;  ? ? ?Current Medications: ?Current Meds  ?Medication Sig  ? acetaminophen (TYLENOL) 500 MG tablet Take 1,000 mg by mouth every 6 (six) hours as needed for moderate pain or mild pain.  ? allopurinol (ZYLOPRIM) 300 MG tablet Take 300 mg by mouth in the morning.  ? aspirin EC 81 MG tablet Take 81 mg by mouth in the morning. Swallow whole.  ? atorvastatin (LIPITOR) 80 MG tablet Take 1 tablet (80 mg total) by mouth daily. (Patient taking differently: Take 80 mg by mouth every evening.)  ? Biotin 1000 MCG tablet Take 1,000 mcg by mouth in the morning.  ? carbamide peroxide (DEBROX) 6.5 % OTIC solution 5 drops 2 (two) times a week. Wax build up  ? famotidine (PEPCID) 20 MG tablet Take 20 mg by mouth at bedtime.  ? fish oil-omega-3 fatty acids 1000 MG capsule Take 1 g by mouth in the morning and at bedtime.  ? losartan (COZAAR) 50 MG tablet Take 1 tablet (50 mg total) by mouth daily. (Patient taking differently: Take 50 mg by mouth every evening.)  ? Polyethyl Glycol-Propyl Glycol (LUBRICANT EYE DROPS) 0.4-0.3 % SOLN Place 1-2 drops into both eyes 3 (three) times daily as needed (tired/dry/irritated eyes.).  ? predniSONE (DELTASONE) 1 MG tablet Take 1 tablet (1 mg total) by mouth daily with breakfast.  ? predniSONE (DELTASONE) 5 MG tablet Take 1 tablet (5 mg total) by mouth daily. Take 1 tablet daily  ? pyridostigmine (MESTINON) 60 MG tablet 1/2 tablet three  times a day  ? XARELTO 20 MG TABS tablet Take 20 mg by mouth in the morning.  ? [DISCONTINUED] metoprolol succinate (TOPROL XL) 25 MG 24 hr tablet Take 1 tablet (25 mg total) by mouth daily.  ?  ? ?Allergies:   Patient has no known allergies.  ? ?Social History  ? ?Socioeconomic History  ? Marital status: Married  ?  Spouse name: Vaughan Basta  ? Number of children: 2  ? Years of education: College  ? Highest education level: Not on file  ?Occupational History  ? Occupation: Retired  ?Tobacco Use  ? Smoking status: Never  ? Smokeless tobacco: Never  ?Vaping Use  ? Vaping Use: Never used  ?Substance and Sexual Activity  ? Alcohol use: Not Currently  ? Drug use: No  ? Sexual activity: Not on file  ?Other Topics Concern  ? Not on file  ?Social History Narrative  ? Lives home w/ his wife  ? Right-handed   ?  Drinks about 3 cups of caffeine per day  ? ?Social Determinants of Health  ? ?Financial Resource Strain: Not on file  ?Food Insecurity: Not on file  ?Transportation Needs: Not on file  ?Physical Activity: Not on file  ?Stress: Not on file  ?Social Connections: Not on file  ?  ? ?Family History: ?The patient's family history includes Anemia in his brother; Cancer in his brother; Heart attack in his father; Heart disease in his brother and father; Hypertension in his brother, brother, mother, and sister. ? ?ROS:   ?Please see the history of present illness.   ? ?All other systems reviewed and are negative. ? ?EKGs/Labs/Other Studies Reviewed:   ? ?The following studies were reviewed today: ? ?[place] LE Venous 08/01/2017: ?Final Interpretation:  ?Right: There is evidence of acute DVT in the Posterior Tibial veins.  ?Ultrasound is unable to distinguish whether obstruction in the Femoral  ?vein, and Popliteal vein is acute or chronic.  ? ?TTE 07/27/2017: ?- Left ventricle: The cavity size was normal. There was mild  ?  concentric hypertrophy. Systolic function was normal. The  ?  estimated ejection fraction was in the range of 60% to  65%. Wall  ?  motion was normal; there were no regional wall motion  ?  abnormalities. Doppler parameters are consistent with abnormal  ?  left ventricular relaxation (grade 1 diastolic dysfunction).  ?- Aortic va

## 2021-08-10 DIAGNOSIS — K2270 Barrett's esophagus without dysplasia: Secondary | ICD-10-CM | POA: Diagnosis not present

## 2021-08-10 DIAGNOSIS — K219 Gastro-esophageal reflux disease without esophagitis: Secondary | ICD-10-CM | POA: Diagnosis not present

## 2021-08-11 ENCOUNTER — Ambulatory Visit (INDEPENDENT_AMBULATORY_CARE_PROVIDER_SITE_OTHER): Payer: Medicare PPO | Admitting: Cardiology

## 2021-08-11 ENCOUNTER — Encounter: Payer: Self-pay | Admitting: Cardiology

## 2021-08-11 ENCOUNTER — Ambulatory Visit (INDEPENDENT_AMBULATORY_CARE_PROVIDER_SITE_OTHER): Payer: Medicare PPO

## 2021-08-11 VITALS — BP 122/70 | HR 54 | Ht 69.0 in | Wt 199.6 lb

## 2021-08-11 DIAGNOSIS — I2510 Atherosclerotic heart disease of native coronary artery without angina pectoris: Secondary | ICD-10-CM

## 2021-08-11 DIAGNOSIS — E785 Hyperlipidemia, unspecified: Secondary | ICD-10-CM | POA: Diagnosis not present

## 2021-08-11 DIAGNOSIS — Z952 Presence of prosthetic heart valve: Secondary | ICD-10-CM

## 2021-08-11 DIAGNOSIS — I493 Ventricular premature depolarization: Secondary | ICD-10-CM | POA: Diagnosis not present

## 2021-08-11 DIAGNOSIS — I10 Essential (primary) hypertension: Secondary | ICD-10-CM | POA: Diagnosis not present

## 2021-08-11 DIAGNOSIS — Z951 Presence of aortocoronary bypass graft: Secondary | ICD-10-CM

## 2021-08-11 DIAGNOSIS — I480 Paroxysmal atrial fibrillation: Secondary | ICD-10-CM | POA: Diagnosis not present

## 2021-08-11 MED ORDER — METOPROLOL SUCCINATE ER 25 MG PO TB24: 12.5000 mg | ORAL_TABLET | Freq: Every day | ORAL | 3 refills | Status: DC

## 2021-08-11 NOTE — Progress Notes (Unsigned)
Enrolled patient for a 3 day Zio XT monitor to be mailed to patients home  

## 2021-08-11 NOTE — Patient Instructions (Signed)
Medication Instructions:  ?DECREASE Metoprolol Succinate (Toprol-XL) to 12.5 mg daily ? ?*If you need a refill on your cardiac medications before your next appointment, please call your pharmacy* ? ?Lab Work: ?NONE ordered at this time of appointment  ? ?If you have labs (blood work) drawn today and your tests are completely normal, you will receive your results only by: ?MyChart Message (if you have MyChart) OR ?A paper copy in the mail ?If you have any lab test that is abnormal or we need to change your treatment, we will call you to review the results. ? ? ?Testing/Procedures: ? ?ZIO XT- Long Term Monitor Instructions ? ?Your physician has requested you wear a ZIO patch monitor for 3 days.  ?This is a single patch monitor. [employer] supplies one patch monitor per enrollment. Additional ?stickers are not available. Please do not apply patch if you will be having a Nuclear Stress Test,  ?Echocardiogram, Cardiac CT, MRI, or Chest Xray during the period you would be wearing the  ?monitor. The patch cannot be worn during these tests. You cannot remove and re-apply the  ?ZIO XT patch monitor.  ?Your ZIO patch monitor will be mailed 3 day USPS to your address on file. It may take 3-5 days  ?to receive your monitor after you have been enrolled.  ?Once you have received your monitor, please review the enclosed instructions. Your monitor  ?has already been registered assigning a specific monitor serial # to you. ? ?Billing and Patient Assistance Program Information ? ?We have supplied [employer] with any of your insurance information on file for billing purposes. ?[employer] offers a sliding scale Patient Assistance Program for patients that do not have  ?insurance, or whose insurance does not completely cover the cost of the ZIO monitor.  ?You must apply for the Patient Assistance Program to qualify for this discounted rate.  ?To apply, please call [employer] at [phone], select option 4, select option 2, ask to apply for   ?Patient Assistance Program. Theodore Demark will ask your household income, and how many people  ?are in your household. They will quote your out-of-pocket cost based on that information.  ?[employer] will also be able to set up a 22-month, interest-free payment plan if needed. ? ?Applying the monitor ?  ?Shave hair from upper left chest.  ?Hold abrader disc by orange tab. Rub abrader in 40 strokes over the upper left chest as  ?indicated in your monitor instructions.  ?Clean area with 4 enclosed alcohol pads. Let dry.  ?Apply patch as indicated in monitor instructions. Patch will be placed under collarbone on left  ?side of chest with arrow pointing upward.  ?Rub patch adhesive wings for 2 minutes. Remove white label marked "1". Remove the white  ?label marked "2". Rub patch adhesive wings for 2 additional minutes.  ?While looking in a mirror, press and release button in center of patch. A small green light will  ?flash 3-4 times. This will be your only indicator that the monitor has been turned on.  ?Do not shower for the first 24 hours. You may shower after the first 24 hours.  ?Press the button if you feel a symptom. You will hear a small click. Record Date, Time and  ?Symptom in the Patient Logbook.  ?When you are ready to remove the patch, follow instructions on the last 2 pages of Patient  ?Logbook. Stick patch monitor onto the last page of Patient Logbook.  ?Place Patient Logbook in the blue and white box. Use locking  tab on box and tape box closed  ?securely. The blue and white box has prepaid postage on it. Please place it in the mailbox as  ?soon as possible. Your physician should have your test results approximately 7 days after the  ?monitor has been mailed back to [facility].  ?Call [facility] at [phone] if you have questions regarding  ?your ZIO XT patch monitor. Call them immediately if you see an orange light blinking on your  ?monitor.  ?If your monitor falls off in less than 4  days, contact our Monitor department at [phone].  ?If your monitor becomes loose or falls off after 4 days call [employer] at [phone] for  ?suggestions on securing your monitor ? ? ? ?Follow-Up: ?At [facility], you and your health needs are our priority.  As part of our continuing mission to provide you with exceptional heart care, we have created designated Provider Care Teams.  These Care Teams include your primary Cardiologist (physician) and Advanced Practice Providers (APPs -  Physician Assistants and Nurse Practitioners) who all work together to provide you with the care you need, when you need it. ? ? ?Your next appointment:   ?6 month(s) ? ?The format for your next appointment:   ?In Person ? ?Provider:   ?Donato Heinz, MD   ? ? ?Other Instructions ? ? ?Important Information About Sugar ? ? ? ? ? ? ?

## 2021-08-14 DIAGNOSIS — I493 Ventricular premature depolarization: Secondary | ICD-10-CM

## 2021-08-18 DIAGNOSIS — L600 Ingrowing nail: Secondary | ICD-10-CM | POA: Diagnosis not present

## 2021-08-23 NOTE — Addendum Note (Signed)
Encounter addended by: Sol Passer on: 08/23/2021 9:28 AM ? Actions taken: Vitals modified

## 2021-08-24 DIAGNOSIS — I493 Ventricular premature depolarization: Secondary | ICD-10-CM | POA: Diagnosis not present

## 2021-08-24 NOTE — Addendum Note (Signed)
Encounter addended by: Cammy Copa, RN on: 08/24/2021 8:02 AM ? Actions taken: Clinical Note Signed

## 2021-08-30 DIAGNOSIS — M79674 Pain in right toe(s): Secondary | ICD-10-CM | POA: Diagnosis not present

## 2021-08-30 DIAGNOSIS — B351 Tinea unguium: Secondary | ICD-10-CM | POA: Diagnosis not present

## 2021-08-30 DIAGNOSIS — M79675 Pain in left toe(s): Secondary | ICD-10-CM | POA: Diagnosis not present

## 2021-08-30 DIAGNOSIS — L600 Ingrowing nail: Secondary | ICD-10-CM | POA: Diagnosis not present

## 2021-10-13 ENCOUNTER — Encounter: Payer: Self-pay | Admitting: Cardiology

## 2021-10-13 ENCOUNTER — Telehealth: Payer: Self-pay | Admitting: *Deleted

## 2021-10-13 NOTE — Telephone Encounter (Signed)
Spoke to wife (ok per DPR)-aware to stop metoprolol.  Unsure what medication is called that he will be taking.  Advised to let us know what medication is and we will check for interactions.

## 2021-10-13 NOTE — Telephone Encounter (Signed)
Received note from patient:  "At last visit it was suggested that metoprolol dosage be cut in half to see if eye issues from myasthenia had become worse because of metoprolol.  Eye issue is the same so wonder if I should return to original metoprolol dosage as it doesn't appear to be having much impact of eye problems.  Also, foot Dr. Prescribed antibiotic for foot fungus.  I'm concerned that it might interfere with other meds.  The foot prescription would be for several months and would require frequent blood tests".      Per Dr. Bjorn Pippin: stop metoprolol. Will need to find out what medication was prescribed.     Left message for patient ot call back to discuss

## 2021-10-13 NOTE — Telephone Encounter (Signed)
Wife was returning call. Please advise ?

## 2021-10-21 DIAGNOSIS — G4733 Obstructive sleep apnea (adult) (pediatric): Secondary | ICD-10-CM | POA: Diagnosis not present

## 2021-11-02 DIAGNOSIS — G7000 Myasthenia gravis without (acute) exacerbation: Secondary | ICD-10-CM | POA: Diagnosis not present

## 2021-11-02 DIAGNOSIS — I825Z9 Chronic embolism and thrombosis of unspecified deep veins of unspecified distal lower extremity: Secondary | ICD-10-CM | POA: Diagnosis not present

## 2021-11-02 DIAGNOSIS — E782 Mixed hyperlipidemia: Secondary | ICD-10-CM | POA: Diagnosis not present

## 2021-11-02 DIAGNOSIS — I10 Essential (primary) hypertension: Secondary | ICD-10-CM | POA: Diagnosis not present

## 2021-11-02 DIAGNOSIS — R7303 Prediabetes: Secondary | ICD-10-CM | POA: Diagnosis not present

## 2021-11-02 DIAGNOSIS — D649 Anemia, unspecified: Secondary | ICD-10-CM | POA: Diagnosis not present

## 2021-11-02 DIAGNOSIS — Z951 Presence of aortocoronary bypass graft: Secondary | ICD-10-CM | POA: Diagnosis not present

## 2021-11-02 DIAGNOSIS — K2270 Barrett's esophagus without dysplasia: Secondary | ICD-10-CM | POA: Diagnosis not present

## 2021-11-02 DIAGNOSIS — M109 Gout, unspecified: Secondary | ICD-10-CM | POA: Diagnosis not present

## 2021-11-16 DIAGNOSIS — M79675 Pain in left toe(s): Secondary | ICD-10-CM | POA: Diagnosis not present

## 2021-11-16 DIAGNOSIS — M79674 Pain in right toe(s): Secondary | ICD-10-CM | POA: Diagnosis not present

## 2021-11-16 DIAGNOSIS — L600 Ingrowing nail: Secondary | ICD-10-CM | POA: Diagnosis not present

## 2021-11-16 DIAGNOSIS — B351 Tinea unguium: Secondary | ICD-10-CM | POA: Diagnosis not present

## 2021-11-29 DIAGNOSIS — D649 Anemia, unspecified: Secondary | ICD-10-CM | POA: Diagnosis not present

## 2021-11-29 DIAGNOSIS — N289 Disorder of kidney and ureter, unspecified: Secondary | ICD-10-CM | POA: Diagnosis not present

## 2021-12-07 DIAGNOSIS — M79674 Pain in right toe(s): Secondary | ICD-10-CM | POA: Diagnosis not present

## 2021-12-07 DIAGNOSIS — M79675 Pain in left toe(s): Secondary | ICD-10-CM | POA: Diagnosis not present

## 2021-12-07 DIAGNOSIS — B351 Tinea unguium: Secondary | ICD-10-CM | POA: Diagnosis not present

## 2021-12-07 DIAGNOSIS — L600 Ingrowing nail: Secondary | ICD-10-CM | POA: Diagnosis not present

## 2022-01-04 DIAGNOSIS — M79675 Pain in left toe(s): Secondary | ICD-10-CM | POA: Diagnosis not present

## 2022-01-04 DIAGNOSIS — L600 Ingrowing nail: Secondary | ICD-10-CM | POA: Diagnosis not present

## 2022-01-04 DIAGNOSIS — M79674 Pain in right toe(s): Secondary | ICD-10-CM | POA: Diagnosis not present

## 2022-01-04 DIAGNOSIS — B351 Tinea unguium: Secondary | ICD-10-CM | POA: Diagnosis not present

## 2022-01-12 DIAGNOSIS — Z85828 Personal history of other malignant neoplasm of skin: Secondary | ICD-10-CM | POA: Diagnosis not present

## 2022-01-12 DIAGNOSIS — L820 Inflamed seborrheic keratosis: Secondary | ICD-10-CM | POA: Diagnosis not present

## 2022-01-12 DIAGNOSIS — L570 Actinic keratosis: Secondary | ICD-10-CM | POA: Diagnosis not present

## 2022-01-12 DIAGNOSIS — D485 Neoplasm of uncertain behavior of skin: Secondary | ICD-10-CM | POA: Diagnosis not present

## 2022-01-12 DIAGNOSIS — C44622 Squamous cell carcinoma of skin of right upper limb, including shoulder: Secondary | ICD-10-CM | POA: Diagnosis not present

## 2022-01-17 ENCOUNTER — Ambulatory Visit: Payer: Medicare PPO | Admitting: Diagnostic Neuroimaging

## 2022-01-24 ENCOUNTER — Encounter: Payer: Self-pay | Admitting: Diagnostic Neuroimaging

## 2022-01-24 ENCOUNTER — Ambulatory Visit (INDEPENDENT_AMBULATORY_CARE_PROVIDER_SITE_OTHER): Payer: Medicare PPO | Admitting: Diagnostic Neuroimaging

## 2022-01-24 VITALS — BP 138/75 | HR 53 | Ht 69.0 in | Wt 196.0 lb

## 2022-01-24 DIAGNOSIS — G603 Idiopathic progressive neuropathy: Secondary | ICD-10-CM | POA: Diagnosis not present

## 2022-01-24 DIAGNOSIS — G7000 Myasthenia gravis without (acute) exacerbation: Secondary | ICD-10-CM | POA: Diagnosis not present

## 2022-01-24 MED ORDER — PYRIDOSTIGMINE BROMIDE 60 MG PO TABS: 30.0000 mg | ORAL_TABLET | Freq: Three times a day (TID) | ORAL | 3 refills | Status: DC

## 2022-01-24 MED ORDER — PREDNISONE 5 MG PO TABS: 5.0000 mg | ORAL_TABLET | Freq: Every day | ORAL | 12 refills | Status: DC

## 2022-01-24 NOTE — Progress Notes (Signed)
[facility]  PATIENT: Edward [name] DOB: 1945/11/23  REFERRING CLINICIAN: Farris Has, MD HISTORY FROM: patient REASON FOR VISIT: follow up    HISTORICAL  CHIEF COMPLAINT:  Chief Complaint  Patient presents with   Myasthenia Gravis    Rm 7, 6 month FU, wife- Edward [name]  "eyes are very dry, blurry vision; my right leg is cramping/throbbing at knee; legs are weak-affected by cold air; brain fog, easily distracted -not new, [name] 70 % hearing loss"    HISTORY OF PRESENT ILLNESS:   UPDATE (01/24/22, VRP): Since last visit, doing well. Mild vision issues. Symptoms are stable. Sleep is poor. Some memory issues.   07/13/21 Edward Ege, NP Edward [name] here today for follow-up. He had reduced prednisone from 6 mg to 5 mg after last visit in September, did okay. A few weeks he increased it back up to 6 mg due to weakness, a lot of exertion with cardiac rehab. [name] helped. Is in cardiac rehab post CABG, valve replacement back in November, had symptoms of heart burn. Not taking trazodone, felt groggy the next day, uses CPAP at night. MG symptoms doing well, have had minimal problems with ocular symptoms. No significant weakness of arms of legs. Doing well cardiac rehab, meeting his goals, this is 5th week. No falls, [name] cane just in case sometimes. No trouble swallowing or chewing. Does have some trouble with short term memory, remembering names, wife thinks poor hearing plays big role. With heat, he [name] worse MG symptoms.   01/06/2021 Dr. Anne [name]: Edward [name] is a 76 year old right-handed white male with a history of myasthenia gravis primarily with ocular features.  The patient [name] a significant peripheral neuropathy associated with bilateral foot drops and some gait instability.  Within the last 6 months he [name] reported some heartburn type symptoms but he also he [name] had some right shoulder and neck discomfort with pain down the right arm.  He [name] seen Edward [name] from neurosurgery, MRI of the  cervical spine was done recently, I do not have the report of this.  The patient [name] had some medication alterations through cardiology.  He currently is on prednisone 6 mg daily and he takes Mestinon 60 mg, 1/2 tablet 3 times daily.  He is not sleeping well.  He reports some occasional dizziness when he stands up off and on.  The heat of the summer [name] worsened some of his myasthenic symptoms, he does have some generalized fatigue.  He reports occasional double vision and slight ptosis.  He reports no weakness of the arms or legs or difficulties with chewing or swallowing.   REVIEW OF SYSTEMS: Full 14 system review of systems performed and negative with exception of: as per HPI.  ALLERGIES: No Known Allergies  HOME MEDICATIONS: Outpatient Medications Prior to Visit  Medication Sig Dispense Refill   allopurinol (ZYLOPRIM) 300 MG tablet Take 300 mg by mouth in the morning.     aspirin EC 81 MG tablet Take 81 mg by mouth in the morning. Swallow whole.     atorvastatin (LIPITOR) 80 MG tablet Take 1 tablet (80 mg total) by mouth daily. (Patient taking differently: Take 80 mg by mouth every evening.) 30 tablet 11   Biotin 1000 MCG tablet Take 1,000 mcg by mouth in the morning.     carbamide peroxide (DEBROX) 6.5 % OTIC solution 5 drops 2 (two) times a week. Wax build up     fish oil-omega-3 fatty acids 1000 MG capsule Take 1 g  by mouth in the morning and at bedtime.     losartan (COZAAR) 50 MG tablet Take 1 tablet (50 mg total) by mouth daily. (Patient taking differently: Take 50 mg by mouth every evening.) 90 tablet 3   Polyethyl Glycol-Propyl Glycol (LUBRICANT EYE DROPS) 0.4-0.3 % SOLN Place 1-2 drops into both eyes 3 (three) times daily as needed (tired/dry/irritated eyes.).     XARELTO 20 MG TABS tablet Take 20 mg by mouth in the morning.     predniSONE (DELTASONE) 1 MG tablet Take 1 tablet (1 mg total) by mouth daily with breakfast. 90 tablet 1   predniSONE (DELTASONE) 5 MG tablet Take 1 tablet  (5 mg total) by mouth daily. Take 1 tablet daily 90 tablet 2   pyridostigmine (MESTINON) 60 MG tablet 1/2 tablet three times a day 135 tablet 3   acetaminophen (TYLENOL) 500 MG tablet Take 1,000 mg by mouth every 6 (six) hours as needed for moderate pain or mild pain.     famotidine (PEPCID) 20 MG tablet Take 20 mg by mouth at bedtime.     No facility-administered medications prior to visit.      PHYSICAL EXAM  GENERAL EXAM/CONSTITUTIONAL: Vitals:  Vitals:   01/24/22 1421  BP: 138/75  Pulse: (!) 53  Weight: 196 lb (88.9 kg)  Height: 5\' 9"  (1.753 m)   Body mass index is 28.94 kg/m. Wt Readings from Last 3 Encounters:  01/24/22 196 lb (88.9 kg)  08/11/21 199 lb 9.6 oz (90.5 kg)  08/06/21 198 lb 10.2 oz (90.1 kg)   Patient is in no distress; well developed, nourished and groomed; neck is supple  CARDIOVASCULAR: Examination of carotid arteries is normal; no carotid bruits Regular rate and rhythm, no murmurs Examination of peripheral vascular system by observation and palpation is normal  EYES: Ophthalmoscopic exam of optic discs and posterior segments is normal; no papilledema or hemorrhages No results found.  MUSCULOSKELETAL: Gait, strength, tone, movements noted in Neurologic exam below  NEUROLOGIC: MENTAL STATUS:      No data to display         awake, alert, oriented to person, place and time recent and remote memory intact normal attention and concentration language fluent, comprehension intact, naming intact fund of knowledge appropriate  CRANIAL NERVE:  2nd - no papilledema on fundoscopic exam 2nd, 3rd, 4th, 6th - pupils equal and reactive to light, visual fields full to confrontation, extraocular muscles intact, no nystagmus 5th - facial sensation symmetric 7th - facial strength symmetric 8th - hearing intact 9th - palate elevates symmetrically, uvula midline 11th - shoulder shrug symmetric 12th - tongue protrusion midline  MOTOR:  normal bulk and  tone, full strength in the BUE, BLE; EXCEPT BILATERAL FOOT DROP  SENSORY:  normal and symmetric to light touch, temperature, vibration  COORDINATION:  finger-nose-finger, fine finger movements normal  REFLEXES:  deep tendon reflexes TRACE and symmetric  GAIT/STATION:  narrow based gait     DIAGNOSTIC DATA (LABS, IMAGING, TESTING) - I reviewed patient records, labs, notes, testing and imaging myself where available.  Lab Results  Component Value Date   WBC 16.5 (H) 03/17/2021   HGB 10.8 (L) 03/17/2021   HCT 32.5 (L) 03/17/2021   MCV 91 03/17/2021   PLT 543 (H) 03/17/2021      Component Value Date/Time   NA 140 06/08/2021 1203   K 5.2 06/08/2021 1203   CL 104 06/08/2021 1203   CO2 24 06/08/2021 1203   GLUCOSE 110 (H) 06/08/2021 1203  GLUCOSE 104 (H) 03/12/2021 0241   BUN 25 06/08/2021 1203   CREATININE 1.33 (H) 06/08/2021 1203   CALCIUM 9.5 06/08/2021 1203   PROT 6.9 03/17/2021 1023   ALBUMIN 4.1 03/17/2021 1023   AST 37 03/17/2021 1023   ALT 37 03/17/2021 1023   ALKPHOS 82 03/17/2021 1023   BILITOT 0.7 03/17/2021 1023   GFRNONAA 45 (L) 03/12/2021 0241   GFRAA 65 12/12/2019 0948   Lab Results  Component Value Date   CHOL 128 04/22/2021   HDL 39 (L) 04/22/2021   LDLCALC 61 04/22/2021   TRIG 165 (H) 04/22/2021   CHOLHDL 3.3 04/22/2021   Lab Results  Component Value Date   HGBA1C 6.2 (H) 03/01/2021   Lab Results  Component Value Date   VITAMINB12 432 01/14/2019   Lab Results  Component Value Date   TSH 1.500 10/15/2015   12/12/19 Component Ref Range & Units 6 yr ago  AChR Binding Ab, Serum 0.00 - 0.24 nmol/L 16.10 High    Comment: **Results     01/14/19 EMG/NCS - Nerve conduction studies done on both lower extremities shows evidence of a primarily axonal peripheral neuropathy of relatively severe severity.  EMG of the right lower extremity shows distal acute and chronic neuropathic signs of denervation consistent with the diagnosis of peripheral  neuropathy.  No clear evidence of an overlying lumbosacral radiculopathy was seen.  12/14/19 MRI of the lumbar spine without contrast shows the following: 1.    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  There is moderate right foraminal narrowing and moderate right lateral recess stenosis but no definite nerve root compression. 2.    At L5-S1, there are degenerative changes including mild retrolisthesis, advanced endplate spurring, severe loss of disc height and minimal facet hypertrophy.  These cause moderately severe right foraminal narrowing that could lead to right L5 nerve root compression. 3.    There are milder degenerative changes at L1-L2, L2-L3 and L3-L4 as detailed above that do not lead to nerve root compression or spinal stenosis.    ASSESSMENT AND PLAN  76 y.o. year old male here with:   Dx:  1. Myasthenia gravis (HCC)   2. Idiopathic progressive neuropathy      PLAN:  OCULAR Myasthenia gravis - reduce prednisone 5mg  daily x 1 month; then 5mg  every other day - continue pyridostigmine 30mg  3 times daily  BILATERAL FOOT DROP / LEG PAIN - may be due to idiopathic neuropathy + bilateral L5 radiculopathies; continue supportive care  FATIGUE / OSA - continue CPAP  MEMORY LOSS - likely related to poor sleep; optimize sleep hygiene  Meds ordered this encounter  Medications   predniSONE (DELTASONE) 5 MG tablet    Sig: Take 1 tablet (5 mg total) by mouth daily. Take 1 tablet daily    Dispense:  30 tablet    Refill:  12   pyridostigmine (MESTINON) 60 MG tablet    Sig: Take 0.5 tablets (30 mg total) by mouth 3 (three) times daily. 1/2 tablet three times a day    Dispense:  90 tablet    Refill:  3   Return in about 6 months (around 07/26/2022).    , MD 01/24/2022, 3:31 PM Certified in Neurology, Neurophysiology and Neuroimaging  [facility] [address], [address] [place], [place] [address] [phone]

## 2022-01-24 NOTE — Patient Instructions (Signed)
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

## 2022-02-24 ENCOUNTER — Other Ambulatory Visit: Payer: Self-pay | Admitting: Cardiology

## 2022-03-25 DIAGNOSIS — D649 Anemia, unspecified: Secondary | ICD-10-CM | POA: Diagnosis not present

## 2022-03-30 ENCOUNTER — Encounter: Payer: Self-pay | Admitting: Cardiology

## 2022-03-30 ENCOUNTER — Ambulatory Visit: Payer: Medicare PPO | Attending: Cardiology | Admitting: Cardiology

## 2022-03-30 VITALS — BP 142/72 | HR 60 | Ht 70.0 in | Wt 195.8 lb

## 2022-03-30 DIAGNOSIS — I10 Essential (primary) hypertension: Secondary | ICD-10-CM | POA: Diagnosis not present

## 2022-03-30 DIAGNOSIS — I480 Paroxysmal atrial fibrillation: Secondary | ICD-10-CM

## 2022-03-30 DIAGNOSIS — I2510 Atherosclerotic heart disease of native coronary artery without angina pectoris: Secondary | ICD-10-CM | POA: Diagnosis not present

## 2022-03-30 DIAGNOSIS — Z952 Presence of prosthetic heart valve: Secondary | ICD-10-CM

## 2022-03-30 DIAGNOSIS — E785 Hyperlipidemia, unspecified: Secondary | ICD-10-CM

## 2022-03-30 NOTE — Patient Instructions (Signed)
Medication Instructions:  The current medical regimen is effective;  continue present plan and medications.  *If you need a refill on your cardiac medications before your next appointment, please call your pharmacy*   Follow-Up: At [facility], you and your health needs are our priority.  As part of our continuing mission to provide you with exceptional heart care, we have created designated Provider Care Teams.  These Care Teams include your primary Cardiologist (physician) and Advanced Practice Providers (APPs -  Physician Assistants and Nurse Practitioners) who all work together to provide you with the care you need, when you need it.  We recommend signing up for the patient portal called "MyChart".  Sign up information is provided on this After Visit Summary.  MyChart is used to connect with patients for Virtual Visits (Telemedicine).  Patients are able to view lab/test results, encounter notes, upcoming appointments, etc.  Non-urgent messages can be sent to your provider as well.   To learn more about what you can do with MyChart, go to [email or web address].    Your next appointment:   6 month(s)  The format for your next appointment:   In Person  Provider:   Little Ishikawa, MD      Take BP x2 daily for 1 week (let us know these readings)

## 2022-03-30 NOTE — Progress Notes (Signed)
Cardiology Office Note:    Date:  03/30/2022   ID:  Edward [name], DOB May 27, 1945, MRN [id number]  PCP:  Farris Has, MD  Cardiologist:  Little Ishikawa, MD  Electrophysiologist:  Lanier Prude, MD   Referring MD: Farris Has, MD   Chief Complaint  Patient presents with   Coronary Artery Disease     History of Present Illness:    Edward [name] is a 76 y.o. male with a hx of aortic stenosis, hypertension, hyperlipidemia, myasthenia gravis, OSA, DVT who presents for follow-up.  He was referred by Dr. Kateri Plummer for evaluation of dyspnea and chest pain, initially seen on 09/29/2020.  Echocardiogram 07/27/2017 showed LVEF 60 to 65%, grade 1 diastolic dysfunction, functionally bicuspid aortic valve with mild to moderate aortic stenosis and mild aortic regurgitation, mild MR, mild TR. Echocardiogram on 10/14/2020 showed normal biventricular function, mild LVH, mild to moderate aortic stenosis, mild aortic regurgitation, mild mitral regurgitation.  Lexiscan Myoview on 11/05/2020 showed fixed inferior defect likely representing artifact, no ischemia, EF 52%.  Calcium score in 11/30/2020 was 674 (72nd percentile).  Continued to have chest pain and underwent cath on 02/11/2021 which showed severe multivessel CAD, moderate AS, normal filling pressures.  Underwent CABG x4 (LIMA-LAD, SVG-PDA, SVG-OM, SVG-D2 Y graft off OM), aortic valve replacement with 25 mm Inspiris Resilia on 03/03/2021.  Postop course was complicated by A. fib which was managed with amiodarone and beta-blocker.  Also developed thrombocytopenia down to 62 but recovery without intervention.  Echocardiogram on 04/30/21 showed EF 50 to 55%, mildly reduced RV function, normal functioning bioprosthetic aortic valve.  Zio patch x3 days on 08/24/2021 showed rare PVCs (less than 1% of beats).  Since last clinic visit, he reports he is doing well.  Has lost 30 pounds since his surgery.  Denies any chest pain, dyspnea, lower extremity edema, or  palpitations.  Reports occasional lightheadedness with standing but denies any syncope.  He reports has been having issues with his myasthenia recently.  He is on Xarelto, denies any bleeding issues.  Reports he is getting a lot of activity but not walking as much due to his myasthenia.  Reports BP 110s to 120s at home.  Reports compliance with CPAP.  Wt Readings from Last 3 Encounters:  03/30/22 195 lb 12.8 oz (88.8 kg)  01/24/22 196 lb (88.9 kg)  08/11/21 199 lb 9.6 oz (90.5 kg)      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 by echo 07/2017   Chronic kidney disease    kidney stones   Colon polyps    Coronary artery disease 2022   Diplopia 10/15/2015   Diverticulosis    DVT (deep venous thrombosis) (HCC)    2018, 2019   Erectile dysfunction    GERD (gastroesophageal reflux disease)    with Barrett's esophagus   Gout    Hearing loss    hearing aides   Heart murmur    pt says he has had it since he was a child, no issues ever mentioned   HTN (hypertension)    Hypercholesteremia    Migraine headache    Myasthenia gravis (HCC)    Obesity    OSA (obstructive sleep apnea)    AHI 43/hr now on CPAP at 10cm H2O   Peripheral neuropathy 01/14/2019   feet   Right foot drop 12/03/2018  Past Surgical History:  Procedure Laterality Date   AORTIC VALVE REPLACEMENT N/A 03/03/2021   Procedure: AORTIC VALVE REPLACEMENT (AVR) WITH BIOPROSTHETIC VALVE, Inpiris Resila Aortic Valve 25mm;  Surgeon: Corliss Skains, MD;  Location: [facility] OR;  Service: Open Heart Surgery;  Laterality: N/A;   CORONARY ARTERY BYPASS GRAFT N/A 03/03/2021   Procedure: CORONARY ARTERY BYPASS GRAFTING (CABG) x four on pump, using left internal mammary artery, left and right endoscopic greater saphenous veins conduits;  Surgeon: Corliss Skains, MD;  Location: [facility] OR;  Service: Open Heart Surgery;   Laterality: N/A;   ENDOVEIN HARVEST OF GREATER SAPHENOUS VEIN Bilateral 03/03/2021   Procedure: ENDOVEIN HARVEST OF GREATER SAPHENOUS VEIN;  Surgeon: Corliss Skains, MD;  Location: [facility] OR;  Service: Open Heart Surgery;  Laterality: Bilateral;   laser assisted uvuloplasty     MEDIASTINAL EXPLORATION N/A 03/03/2021   Procedure: MEDIASTINAL EXPLORATION;  Surgeon: Corliss Skains, MD;  Location: [facility] OR;  Service: Open Heart Surgery;  Laterality: N/A;   RIGHT/LEFT HEART CATH AND CORONARY ANGIOGRAPHY N/A 02/11/2021   Procedure: RIGHT/LEFT HEART CATH AND CORONARY ANGIOGRAPHY;  Surgeon: [place], Peter M, MD;  Location: [facility] INVASIVE CV LAB;  Service: Cardiovascular;  Laterality: N/A;   TEE WITHOUT CARDIOVERSION N/A 03/03/2021   Procedure: TRANSESOPHAGEAL ECHOCARDIOGRAM (TEE);  Surgeon: Corliss Skains, MD;  Location: [facility] OR;  Service: Open Heart Surgery;  Laterality: N/A;    Current Medications: Current Meds  Medication Sig   allopurinol (ZYLOPRIM) 300 MG tablet Take 300 mg by mouth in the morning.   aspirin EC 81 MG tablet Take 81 mg by mouth in the morning. Swallow whole.   atorvastatin (LIPITOR) 80 MG tablet TAKE 1 TABLET BY MOUTH EVERY DAY   Biotin 1000 MCG tablet Take 1,000 mcg by mouth in the morning.   carbamide peroxide (DEBROX) 6.5 % OTIC solution 5 drops 2 (two) times a week. Wax build up   fish oil-omega-3 fatty acids 1000 MG capsule Take 1 g by mouth in the morning and at bedtime.   losartan (COZAAR) 50 MG tablet Take 1 tablet (50 mg total) by mouth daily. (Patient taking differently: Take 50 mg by mouth every evening.)   Polyethyl Glycol-Propyl Glycol (LUBRICANT EYE DROPS) 0.4-0.3 % SOLN Place 1-2 drops into both eyes 3 (three) times daily as needed (tired/dry/irritated eyes.).   predniSONE (DELTASONE) 5 MG tablet Take 1 tablet (5 mg total) by mouth daily. Take 1 tablet daily   pyridostigmine (MESTINON) 60 MG tablet Take 0.5 tablets (30 mg total) by mouth 3 (three) times daily.  1/2 tablet three times a day   XARELTO 20 MG TABS tablet Take 20 mg by mouth in the morning.     Allergies:   Patient has no known allergies.   Social History   Socioeconomic History   Marital status: Married    Spouse name: [name]   Number of children: 2   Years of education: [employer] education level: Not on file  Occupational History   Occupation: Retired  Tobacco Use   Smoking status: Never   Smokeless tobacco: Never  Vaping Use   Vaping Use: Never used  Substance and Sexual Activity   Alcohol use: Not Currently   Drug use: No   Sexual activity: Not on file  Other Topics Concern   Not on file  Social History Narrative   Lives home w/ his wife   Right-handed    Drinks about 3 cups of caffeine per day  Social Determinants of Health   Financial Resource Strain: Not on file  Food Insecurity: Not on file  Transportation Needs: Not on file  Physical Activity: Not on file  Stress: Not on file  Social Connections: Not on file     Family History: The patient's family history includes Anemia in his brother; Cancer in his brother; Heart attack in his father; Heart disease in his brother and father; Hypertension in his brother, brother, mother, and sister.  ROS:   Please see the history of present illness.    All other systems reviewed and are negative.  EKGs/Labs/Other Studies Reviewed:    The following studies were reviewed today:  [place] LE Venous 08/01/2017: Final Interpretation:  Right: There is evidence of acute DVT in the Posterior Tibial veins.  Ultrasound is unable to distinguish whether obstruction in the Femoral  vein, and Popliteal vein is acute or chronic.   TTE 07/27/2017: - Left ventricle: The cavity size was normal. There was mild    concentric hypertrophy. Systolic function was normal. The    estimated ejection fraction was in the range of 60% to 65%. Wall    motion was normal; there were no regional wall motion    abnormalities. Doppler  parameters are consistent with abnormal    left ventricular relaxation (grade 1 diastolic dysfunction).  - Aortic valve: Functionally bicuspid; moderately thickened,    moderately calcified leaflets. There was mild to moderate    stenosis. There was mild regurgitation. Peak gradient (S): 25 mm    Hg. Valve area (VTI): 1.24 cm^2. Valve area (Vmax): 1.42 cm^2.    Valve area (Vmean): 1.37 cm^2.  - Mitral valve: There was mild regurgitation.  - Left atrium: The atrium was at the upper limits of normal in    size.  - Right ventricle: The cavity size was normal. Wall thickness was    normal. Systolic function was normal.  - Tricuspid valve: There was mild regurgitation.  - Pulmonary arteries: Systolic pressure was within the normal    range.   US Venous LE Doppler 06/27/2016: 1. Positive for acute to subacute deep venous thrombosis extending from the popliteal vein into the common femoral vein. The common femoral and femoral venous thrombus is nonocclusive while the popliteal venous thrombus is occlusive.  Echo 08/31/2015: - Left ventricle: The cavity size was normal. Wall thickness was    normal. Systolic function was vigorous. The estimated ejection    fraction was in the range of 65% to 70%. Wall motion was normal;    there were no regional wall motion abnormalities. Doppler    parameters are consistent with abnormal left ventricular    relaxation (grade 1 diastolic dysfunction).  - Aortic valve: There was mild regurgitation. Mean gradient (S): 9    mm Hg. Peak gradient (S): 15 mm Hg. Valve area (VTI): 1.98 cm^2.    Valve area (Vmax): 2.07 cm^2. Valve area (Vmean): 1.87 cm^2.  - Pulmonary arteries: Systolic pressure was mildly increased. PA    peak pressure: 32 mm Hg (S).   EKG:  03/30/2022: Normal sinus rhythm, rate 60, nonspecific T wave flattening, no PVCs 03/17/21: Sinus bradycardia, rate 52, less than 1 mm ST depressions in leads V3-6, nonspecific T wave flattening 12/08/20: Sinus  rhythm, PVCs, rate 71, Q waves in lead III 11/09/2020: no ekg ordered today.  09/29/2020: NSR, PACs, PVCs, nonspecific T wave flattening, rate 84 bpm   Recent Labs: 06/08/2021: BUN 25; Creatinine, Ser 1.33; Potassium 5.2; Sodium 140  Recent  Lipid Panel    Component Value Date/Time   CHOL 128 04/22/2021 0905   TRIG 165 (H) 04/22/2021 0905   HDL 39 (L) 04/22/2021 0905   CHOLHDL 3.3 04/22/2021 0905   LDLCALC 61 04/22/2021 0905    Physical Exam:    VS:  BP (!) 142/72   Pulse 60   Ht 5\' 10"  (1.778 m)   Wt 195 lb 12.8 oz (88.8 kg)   SpO2 96%   BMI 28.09 kg/m     Wt Readings from Last 3 Encounters:  03/30/22 195 lb 12.8 oz (88.8 kg)  01/24/22 196 lb (88.9 kg)  08/11/21 199 lb 9.6 oz (90.5 kg)     GEN: Well nourished, well developed in no acute distress HEENT: Normal NECK: No JVD; No carotid bruits CARDIAC: RRR, no murmur RESPIRATORY:  Clear to auscultation without rales, wheezing or rhonchi  ABDOMEN: Soft, non-tender, non-distended MUSCULOSKELETAL:  Trace RLE edema.  Incisions c/d/i SKIN: Warm and dry NEUROLOGIC:  Alert and oriented x 3 PSYCHIATRIC:  Normal affect   ASSESSMENT:    1. Coronary artery disease involving native coronary artery of native heart without angina pectoris   2. Essential hypertension   3. S/P AVR   4. Hyperlipidemia, unspecified hyperlipidemia type   5. PAF (paroxysmal atrial fibrillation) (HCC)     PLAN:    CAD: Reported chest pain.  Lexiscan Myoview on 11/05/2020 showed fixed inferior defect likely representing artifact, no ischemia, EF 52%.  Echocardiogram on 10/14/2020 showed normal biventricular function, mild LVH, mild to moderate aortic stenosis, mild aortic regurgitation, mild mitral regurgitation.  Calcium score in 11/30/2020 was 674 (72nd percentile).  Continued to have chest pain and underwent cath on 02/11/2021 which showed severe multivessel CAD, moderate AS, normal filling pressures.  Underwent CABG x4 (LIMA-LAD, SVG-PDA, SVG-OM, SVG-D2 Y  graft off OM) on 03/03/21 -Continue aspirin 81 mg daily -Continue atorvastatin 80 mg daily -Stopped metoprolol due to his myasthenia  Aortic stenosis s/p AVR: Mild to moderate on echo 07/2017.  Echocardiogram on 10/14/2020 showed normal biventricular function, mild LVH, mild to moderate aortic stenosis, mild aortic regurgitation, mild mitral regurgitation.  Cath 02/11/2021 suggested moderate aortic stenosis.  Underwent aortic valve replacement with 25 mm Inspiris Resilia on 03/03/2021 at time of CABG.  Echocardiogram on 04/30/21 showed EF 50 to 55%, mildly reduced RV function, normal functioning bioprosthetic aortic valve.  Hyperlipidemia: On atorvastatin 80 mg daily, LDL 61 on 04/22/21  Hypertension: On amlodipine 5 mg daily and losartan 50 mg daily prior to CABG. Switched to metoprolol 25 mg twice daily following CABG but was discontinued due to worsening myasthenia symptoms.  Currently on losartan 50 mg daily.  BP mildly elevated in clinic but reports under better control at home.  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  Zio patch x3 days 12/16/2020 showed 95 episodes of NSVT with longest lasting 12 beats and frequent PVCs (14% of beats).  Suspect related to ischemia in setting of severe multivessel disease.  Zio patch x3 days on 08/24/2021 showed rare PVCs (less than 1% of beats).  Paroxysmal atrial fibrillation: Occurred in postoperative setting after CABG.  Loaded with amiodarone, has been discontinued.  Continued on Xarelto.  Currently in sinus rhythm.  OSA: on CPAP, reports compliance  RTC in 6 months   Medication Adjustments/Labs and Tests Ordered: Current medicines are reviewed at length with the patient today.  Concerns regarding medicines are outlined above.  Orders Placed This Encounter  Procedures   EKG 12-Lead     No orders of the defined types were placed in this  encounter.     Patient Instructions  Medication Instructions:  The current medical regimen is effective;  continue present plan and medications.  *If you need a refill on your cardiac medications before your next appointment, please call your pharmacy*   Follow-Up: At [facility], you and your health needs are our priority.  As part of our continuing mission to provide you with exceptional heart care, we have created designated Provider Care Teams.  These Care Teams include your primary Cardiologist (physician) and Advanced Practice Providers (APPs -  Physician Assistants and Nurse Practitioners) who all work together to provide you with the care you need, when you need it.  We recommend signing up for the patient portal called "MyChart".  Sign up information is provided on this After Visit Summary.  MyChart is used to connect with patients for Virtual Visits (Telemedicine).  Patients are able to view lab/test results, encounter notes, upcoming appointments, etc.  Non-urgent messages can be sent to your provider as well.   To learn more about what you can do with MyChart, go to [email or web address].    Your next appointment:   6 month(s)  The format for your next appointment:   In Person  Provider:   Little Ishikawa, MD      Take BP x2 daily for 1 week (let us know these readings)         Signed, Little Ishikawa, MD  03/30/2022 12:30 PM    [facility]

## 2022-03-31 ENCOUNTER — Encounter: Payer: Self-pay | Admitting: Diagnostic Neuroimaging

## 2022-03-31 ENCOUNTER — Encounter: Payer: Self-pay | Admitting: Cardiology

## 2022-03-31 DIAGNOSIS — I10 Essential (primary) hypertension: Secondary | ICD-10-CM

## 2022-04-04 NOTE — Telephone Encounter (Signed)
Blood pressure is high.  Prior to his CABG he was on losartan 50 mg daily and amlodipine 5 mg daily.  Lets go ahead and add back the amlodipine 5 mg daily.  Recommend checking BP daily for next 2 weeks and can we schedule in pharmacy hypertension clinic?

## 2022-04-04 NOTE — Telephone Encounter (Signed)
Patient is taking 1/2 a pill of mestinon TID. His last visit he was advised to drop back on the prednisone to 5mg  every other day. Since then he has been having some trouble and would like to know if he can change the dosing of the mestinon to help make up for lowering the prednisone. Please advise. Thanks

## 2022-04-04 NOTE — Telephone Encounter (Signed)
May increase mestinon to 60mg  twice a day or three times a day.  , MD 04/04/2022, 1:58 PM Certified in Neurology, Neurophysiology and Neuroimaging  [facility] [address] [place], [place] [id number]

## 2022-04-05 MED ORDER — AMLODIPINE BESYLATE 5 MG PO TABS: 5.0000 mg | ORAL_TABLET | Freq: Every day | ORAL | 3 refills | Status: DC

## 2022-04-08 ENCOUNTER — Ambulatory Visit: Payer: Medicare PPO | Attending: Cardiovascular Disease | Admitting: Pharmacist

## 2022-04-08 VITALS — BP 135/80 | HR 60

## 2022-04-08 DIAGNOSIS — I10 Essential (primary) hypertension: Secondary | ICD-10-CM

## 2022-04-08 NOTE — Progress Notes (Signed)
Patient ID: Edward [name]                 DOB: 03/23/1946                      MRN: [id number]     HPI: Edward [name] is a 76 y.o. male referred by Dr. Bjorn [name] to HTN clinic. PMH is significant for myasthenia gravis, HTN, aortic stenosis, AVR, and CABG x4.   BP elevated last week at appointment with DR. Bjorn [name]. Patient instructed to monitor at home and report readings back. Home readings increased further at home.  Patient presents today with wife. Unstable on feet, has difficulty hearing. Eyes get tired easily when reading or completing tasks. Neurology increased Mestinon earlier this week however he has not noticed any benefit yet.   Brought home cuff, older model made by [employer]. Asked patient to demonstrate technique. Used his right arm (because readings are lower than on left arm per patient) and attached cuff upside down.   After educating patient on proper cuff placement had patient test. Reading one: 97/66. Reading 2:  In home BP cvs cuff: 97/66, 135/70.  Majority of home readings 120s/70s however unclear what his technique was at the time.  Patient has had no negative side effects to amlodipine.   Current HTN meds:  Amlodipine 5mg  daily Losartan 50mg   daily Toprol XL 12.5mg  daily  BP goal: <130/80   Wt Readings from Last 3 Encounters:  03/30/22 195 lb 12.8 oz (88.8 kg)  01/24/22 196 lb (88.9 kg)  08/11/21 199 lb 9.6 oz (90.5 kg)   BP Readings from Last 3 Encounters:  03/30/22 (!) 142/72  01/24/22 138/75  08/11/21 122/70   Pulse Readings from Last 3 Encounters:  03/30/22 60  01/24/22 (!) 53  08/11/21 (!) 54    Renal function: CrCl cannot be calculated (Patient's most recent lab result is older than the maximum 21 days allowed.).  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 by echo 07/2017   Chronic kidney disease    kidney stones   Colon polyps     Coronary artery disease 2022   Diplopia 10/15/2015   Diverticulosis    DVT (deep venous thrombosis) (HCC)    2018, 2019   Erectile dysfunction    GERD (gastroesophageal reflux disease)    with Barrett's esophagus   Gout    Hearing loss    hearing aides   Heart murmur    pt says he has had it since he was a child, no issues ever mentioned   HTN (hypertension)    Hypercholesteremia    Migraine headache    Myasthenia gravis (HCC)    Obesity    OSA (obstructive sleep apnea)    AHI 43/hr now on CPAP at 10cm H2O   Peripheral neuropathy 01/14/2019   feet   Right foot drop 12/03/2018    Current Outpatient Medications on File Prior to Visit  Medication Sig Dispense Refill   metoprolol succinate (TOPROL-XL) 25 MG 24 hr tablet Take 12.5 mg by mouth daily.     pantoprazole (PROTONIX) 40 MG tablet Take 40 mg by mouth daily.     allopurinol (ZYLOPRIM) 300 MG tablet Take 300 mg by mouth in the morning.     amLODipine (NORVASC) 5 MG tablet Take 1 tablet (5 mg total) by mouth daily. 90  tablet 3   aspirin EC 81 MG tablet Take 81 mg by mouth in the morning. Swallow whole.     atorvastatin (LIPITOR) 80 MG tablet TAKE 1 TABLET BY MOUTH EVERY DAY 90 tablet 1   Biotin 1000 MCG tablet Take 1,000 mcg by mouth in the morning.     carbamide peroxide (DEBROX) 6.5 % OTIC solution 5 drops 2 (two) times a week. Wax build up     fish oil-omega-3 fatty acids 1000 MG capsule Take 1 g by mouth in the morning and at bedtime.     losartan (COZAAR) 50 MG tablet Take 1 tablet (50 mg total) by mouth daily. (Patient taking differently: Take 50 mg by mouth every evening.) 90 tablet 3   Polyethyl Glycol-Propyl Glycol (LUBRICANT EYE DROPS) 0.4-0.3 % SOLN Place 1-2 drops into both eyes 3 (three) times daily as needed (tired/dry/irritated eyes.).     predniSONE (DELTASONE) 5 MG tablet Take 1 tablet (5 mg total) by mouth daily. Take 1 tablet daily 30 tablet 12   pyridostigmine (MESTINON) 60 MG tablet Take 0.5 tablets (30 mg  total) by mouth 3 (three) times daily. 1/2 tablet three times a day 90 tablet 3   XARELTO 20 MG TABS tablet Take 20 mg by mouth in the morning.     No current facility-administered medications on file prior to visit.    No Known Allergies   Assessment/Plan:  1. Hypertension -  Patient BP in room 135/80 which is slightly above goal of <130/80. Difficult to assess home readings since technique is variable and home cuff gave two dramatically different readings.  Gave patient recommended [employer] cuffs to purchase and educated on proper BP technique.  Patient and wife voiced understanding.  Will have patient continue to monitor at home with no med changes at this time.  Recheck in clinic in 4 weeks with readings.  Continue amlodipine 5mg  daily Continue losartan 50mg  daily Continue Toprol XL 50mg  daily Recheck in 4 weeks

## 2022-04-08 NOTE — Patient Instructions (Addendum)
It was nice meeting you two  We would like your blood pressure to be less than 130/80  The blood pressure cuffs we recommend are made by [employer]. I recommend the Series 3 or greater  When you take your blood pressure, use your left arm after sitting comfortably for 5 minutes with your feet flat on the floor and no talking  Please continue your:  Amlodipine 5mg  daily  Losartan 50mg  daily  Please continue checking your blood pressure at home and call if there are any issues  , PharmD, BCACP, CDCES, CPP [address] [place], [address], [place] Phone: [phone] , Fax: [phone]

## 2022-04-12 ENCOUNTER — Other Ambulatory Visit: Payer: Self-pay | Admitting: Neurology

## 2022-05-05 DIAGNOSIS — I825Z9 Chronic embolism and thrombosis of unspecified deep veins of unspecified distal lower extremity: Secondary | ICD-10-CM | POA: Diagnosis not present

## 2022-05-05 DIAGNOSIS — G7000 Myasthenia gravis without (acute) exacerbation: Secondary | ICD-10-CM | POA: Diagnosis not present

## 2022-05-05 DIAGNOSIS — G4733 Obstructive sleep apnea (adult) (pediatric): Secondary | ICD-10-CM | POA: Diagnosis not present

## 2022-05-05 DIAGNOSIS — D649 Anemia, unspecified: Secondary | ICD-10-CM | POA: Diagnosis not present

## 2022-05-05 DIAGNOSIS — E782 Mixed hyperlipidemia: Secondary | ICD-10-CM | POA: Diagnosis not present

## 2022-05-05 DIAGNOSIS — R7303 Prediabetes: Secondary | ICD-10-CM | POA: Diagnosis not present

## 2022-05-05 DIAGNOSIS — I10 Essential (primary) hypertension: Secondary | ICD-10-CM | POA: Diagnosis not present

## 2022-05-05 DIAGNOSIS — I700 Atherosclerosis of aorta: Secondary | ICD-10-CM | POA: Diagnosis not present

## 2022-05-05 DIAGNOSIS — Z0000 Encounter for general adult medical examination without abnormal findings: Secondary | ICD-10-CM | POA: Diagnosis not present

## 2022-05-05 DIAGNOSIS — H6123 Impacted cerumen, bilateral: Secondary | ICD-10-CM | POA: Diagnosis not present

## 2022-05-17 ENCOUNTER — Ambulatory Visit: Payer: Medicare PPO

## 2022-05-23 DIAGNOSIS — G4733 Obstructive sleep apnea (adult) (pediatric): Secondary | ICD-10-CM | POA: Diagnosis not present

## 2022-06-01 DIAGNOSIS — H52202 Unspecified astigmatism, left eye: Secondary | ICD-10-CM | POA: Diagnosis not present

## 2022-06-01 DIAGNOSIS — G7000 Myasthenia gravis without (acute) exacerbation: Secondary | ICD-10-CM | POA: Diagnosis not present

## 2022-06-01 DIAGNOSIS — H524 Presbyopia: Secondary | ICD-10-CM | POA: Diagnosis not present

## 2022-06-01 DIAGNOSIS — H2513 Age-related nuclear cataract, bilateral: Secondary | ICD-10-CM | POA: Diagnosis not present

## 2022-06-02 ENCOUNTER — Encounter: Payer: Self-pay | Admitting: Student

## 2022-06-02 ENCOUNTER — Ambulatory Visit: Payer: Medicare PPO | Attending: Cardiology | Admitting: Student

## 2022-06-02 VITALS — BP 127/68 | HR 63

## 2022-06-02 DIAGNOSIS — I10 Essential (primary) hypertension: Secondary | ICD-10-CM

## 2022-06-02 NOTE — Assessment & Plan Note (Signed)
Assessment: BP is controlled (goal <130/80) in office BP 127/68 with heart rate 63 Home BP ~120/65-70 with HR 70  Takes all BP mediations regularly and tolerates them well without any side effects Denies SOB, palpitation, chest pain, headaches,or swelling Reiterated the importance of regular exercise and low salt diet   Plan:  Given controlled BP no changes to the medications  Continue taking losartan 50 mg daily and amlodipine 5 mg daily  Patient to keep record of BP readings with heart rate and report to [place] at the next visit No follow up required with PharmD patient to see Dr.Schumann 6 months

## 2022-06-02 NOTE — Progress Notes (Signed)
Patient ID: Edward [name]                 DOB: [birth year]                      MRN: [id number]      HPI:  Edward [name] is a 77 y.o. male referred by Dr. Gardiner Rhyme to HTN clinic. PMH is significant for myasthenia gravis, HTN, aortic stenosis, AVR, and CABG x4.    BP elevated last week at appointment with DR. Gardiner Rhyme. Patient instructed to monitor at home and report readings back. Home readings increased further at home. At the last visit with PharmD home cuff was validated and it was inaccurate. Appropriate home BP measurement steps were discussed with the patient and his wife.   Today patient presented with his wife. His BP has been great ~ 120/65-70 with HR 68-70 range. He feels great. Takes medications regularly and reports no side effects. He is only on amlodipine and losartan he is been off of metoprolol long time back. Recently his neurologist took him off of prednisone. He enjoys woodwork and try to stay active around the house despite low muscle strength from MG.    Current HTN meds:  Amlodipine 5mg  daily Losartan 50mg   daily  BP goal: <130/80  Family History:  father - died at age of 83 from heart attack  Sibling: lung cancer - heavy smoker  Other sibling - dementia   Social History:  alcohol- none Smoking: never  Diet: no salt added    Exercise: due to MG does not do strenuous activity but stays active around the house - loves wood carving and other wood work    Home BP readings:  Date SBP/DBP  HR   118/62 69   120/67 74   122/65 63  Average 120/66 70    Wt Readings from Last 3 Encounters:  03/30/22 195 lb 12.8 oz (88.8 kg)  01/24/22 196 lb (88.9 kg)  08/11/21 199 lb 9.6 oz (90.5 kg)   BP Readings from Last 3 Encounters:  06/02/22 127/68  04/08/22 135/80  03/30/22 (!) 142/72   Pulse Readings from Last 3 Encounters:  06/02/22 63  04/08/22 60  03/30/22 60    Renal function: CrCl cannot be calculated (Patient's most recent lab result is older than the  maximum 21 days allowed.).  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 by echo 07/2017   Chronic kidney disease    kidney stones   Colon polyps    Coronary artery disease 2022   Diplopia 10/15/2015   Diverticulosis    DVT (deep venous thrombosis) ([facility])    2018, 2019   Erectile dysfunction    GERD (gastroesophageal reflux disease)    with Barrett's esophagus   Gout    Hearing loss    hearing aides   Heart murmur    pt says he has had it since he was a child, no issues ever mentioned   HTN (hypertension)    Hypercholesteremia    Migraine headache    Myasthenia gravis (HCC)    Obesity    OSA (obstructive sleep apnea)    AHI 43/hr now on CPAP at 10cm H2O   Peripheral neuropathy 01/14/2019   feet   Right foot drop 12/03/2018    Current Outpatient Medications on File Prior to Visit  Medication Sig Dispense Refill   allopurinol (ZYLOPRIM) 300 MG tablet Take 300 mg by mouth in the morning.     amLODipine (NORVASC) 5 MG tablet Take 1 tablet (5 mg total) by mouth daily. 90 tablet 3   aspirin EC 81 MG tablet Take 81 mg by mouth in the morning. Swallow whole.     atorvastatin (LIPITOR) 80 MG tablet TAKE 1 TABLET BY MOUTH EVERY DAY 90 tablet 1   Biotin 1000 MCG tablet Take 1,000 mcg by mouth in the morning.     carbamide peroxide (DEBROX) 6.5 % OTIC solution 5 drops 2 (two) times a week. Wax build up     fish oil-omega-3 fatty acids 1000 MG capsule Take 1 g by mouth in the morning and at bedtime.     losartan (COZAAR) 50 MG tablet Take 1 tablet (50 mg total) by mouth daily. (Patient taking differently: Take 50 mg by mouth every evening.) 90 tablet 3   pantoprazole (PROTONIX) 40 MG tablet Take 40 mg by mouth daily.     Polyethyl Glycol-Propyl Glycol (LUBRICANT EYE DROPS) 0.4-0.3 % SOLN Place 1-2 drops into both eyes 3 (three) times daily as needed (tired/dry/irritated eyes.).      pyridostigmine (MESTINON) 60 MG tablet Take 0.5 tablets (30 mg total) by mouth 3 (three) times daily. 1/2 tablet three times a day (Patient taking differently: Take 30 mg by mouth 3 (three) times daily. 60mg  in the morning, 30mg  in the afternoon, 60mg  in the evening) 90 tablet 3   XARELTO 20 MG TABS tablet Take 20 mg by mouth in the morning.     No current facility-administered medications on file prior to visit.    No Known Allergies  Blood pressure 127/68, pulse 63, SpO2 98 %.   Essential hypertension Assessment: BP is controlled (goal <130/80) in office BP 127/68 with heart rate 63 Home BP ~120/65-70 with HR 70  Takes all BP mediations regularly and tolerates them well without any side effects Denies SOB, palpitation, chest pain, headaches,or swelling Reiterated the importance of regular exercise and low salt diet   Plan:  Given controlled BP no changes to the medications  Continue taking losartan 50 mg daily and amlodipine 5 mg daily  Patient to keep record of BP readings with heart rate and report to [place] at the next visit No follow up required with PharmD patient to see Dr.Schumann 6 months       Thank you  Cammy Copa, Pharm.D [facility] A Division of [facility] [address], [place], [place] [address]  Phone: [phone]; Fax: [phone]

## 2022-06-22 ENCOUNTER — Encounter: Payer: Self-pay | Admitting: Cardiology

## 2022-06-24 MED ORDER — AMOXICILLIN 500 MG PO TABS: ORAL_TABLET | ORAL | 2 refills | Status: DC

## 2022-06-24 NOTE — Telephone Encounter (Signed)
Yes given prosthetic heart valve he needs antibiotic prophylaxis.  Recommend dose of amoxicillin 2 g to take 30 minutes prior to his dental cleaning

## 2022-06-27 ENCOUNTER — Telehealth: Payer: Self-pay | Admitting: Neurology

## 2022-06-27 ENCOUNTER — Other Ambulatory Visit: Payer: Self-pay | Admitting: Neurology

## 2022-06-27 DIAGNOSIS — Z85828 Personal history of other malignant neoplasm of skin: Secondary | ICD-10-CM | POA: Diagnosis not present

## 2022-06-27 DIAGNOSIS — B356 Tinea cruris: Secondary | ICD-10-CM | POA: Diagnosis not present

## 2022-06-27 MED ORDER — PYRIDOSTIGMINE BROMIDE 60 MG PO TABS: 60.0000 mg | ORAL_TABLET | Freq: Three times a day (TID) | ORAL | 3 refills | Status: DC

## 2022-06-27 NOTE — Telephone Encounter (Signed)
Edward [name] - per our discussion refill needed for "pyridostigmine" he is now taking 3 tablets daily was taking 3 halves. He runs out on Wednesday so can a prescription be called in to [facility]. He need the prescription to read "3 tablets daily" so he has enough. Any issues please call Pt [phone]

## 2022-06-27 NOTE — Telephone Encounter (Signed)
Refill has been sent to the pharmacy for the patient 1 tab TID

## 2022-06-27 NOTE — Addendum Note (Signed)
Addended by: Darleen Crocker on: 06/27/2022 12:18 PM   Modules accepted: Orders

## 2022-06-29 DIAGNOSIS — M65341 Trigger finger, right ring finger: Secondary | ICD-10-CM | POA: Diagnosis not present

## 2022-06-29 DIAGNOSIS — M65352 Trigger finger, left little finger: Secondary | ICD-10-CM | POA: Diagnosis not present

## 2022-08-02 ENCOUNTER — Encounter: Payer: Self-pay | Admitting: Neurology

## 2022-08-02 ENCOUNTER — Ambulatory Visit (INDEPENDENT_AMBULATORY_CARE_PROVIDER_SITE_OTHER): Payer: Medicare PPO | Admitting: Neurology

## 2022-08-02 VITALS — BP 137/72 | HR 62 | Ht 69.0 in | Wt 191.0 lb

## 2022-08-02 DIAGNOSIS — M5412 Radiculopathy, cervical region: Secondary | ICD-10-CM | POA: Insufficient documentation

## 2022-08-02 DIAGNOSIS — M48061 Spinal stenosis, lumbar region without neurogenic claudication: Secondary | ICD-10-CM | POA: Insufficient documentation

## 2022-08-02 DIAGNOSIS — M4310 Spondylolisthesis, site unspecified: Secondary | ICD-10-CM | POA: Insufficient documentation

## 2022-08-02 DIAGNOSIS — G603 Idiopathic progressive neuropathy: Secondary | ICD-10-CM | POA: Diagnosis not present

## 2022-08-02 DIAGNOSIS — G7000 Myasthenia gravis without (acute) exacerbation: Secondary | ICD-10-CM | POA: Diagnosis not present

## 2022-08-02 MED ORDER — PYRIDOSTIGMINE BROMIDE 60 MG PO TABS: 60.0000 mg | ORAL_TABLET | Freq: Three times a day (TID) | ORAL | 3 refills | Status: DC

## 2022-08-02 NOTE — Patient Instructions (Addendum)
Consider AFO brace, check online options Continue Mestinon for MG Consider PT, work on leg strengthening  Call for any worsening symptoms

## 2022-08-02 NOTE — Progress Notes (Signed)
PATIENT: Edward [name] DOB: 1945/06/05  REASON FOR VISIT: Follow up ocular MG, peripheral neuropathy, bilateral foot drop HISTORY FROM: Patient, wife PRIMARY NEUROLOGIST: Dr. Marjory Lies   ASSESSMENT AND PLAN 77 y.o. year old male   1.  Myasthenia gravis 2.  Peripheral neuropathy  -MG has been stable since stopping prednisone around December 2023 -Continue Mestinon 60 mg 2-3 times a day  -Recommend continue to work on gait and leg strengthening exercises -We discussed AFOs, he wants to think about it -Call for worsening symptoms, follow-up in 6 months or sooner if needed  HISTORY OF PRESENT ILLNESS: Today 08/02/22 Saw Dr. Marjory Lies and last visit, prednisone reduced to 5 mg daily for 1 month then 5 mg every other day, then he stopped it.  He had called in December with some worsening MG symptoms, Mestinon was increased 60 mg 2-3 times a day. Currently taking 60 mg BID, 30 mg midday. MG is doing well. He has some diarrhea, but this is chronic. He has cataracts, no ptosis or diplopia. He has bilateral foot drop. He does stumble and fall. He has lighter shoes that's helps. No back pain, was seeing Dr. Dutch Quint in the past for back pain. He has some general weakness, not any worse since stopping prednisone. He works all the time in the yard, staying active.   UPDATE (01/24/22, VRP): Since last visit, doing well. Mild vision issues. Symptoms are stable. Sleep is poor. Some memory issues.   Update 07/13/21 SS: Edward [name] here today for follow-up. He had reduced prednisone from 6 mg to 5 mg after last visit in September, did okay. A few weeks he increased it back up to 6 mg due to weakness, a lot of exertion with cardiac rehab. Has helped. Is in cardiac rehab post CABG, valve replacement back in November, had symptoms of heart burn. Not taking trazodone, felt groggy the next day, uses CPAP at night. MG symptoms doing well, have had minimal problems with ocular symptoms. No significant weakness of arms  of legs. Doing well cardiac rehab, meeting his goals, this is 5th week. No falls, has cane just in case sometimes. No trouble swallowing or chewing. Does have some trouble with short term memory, remembering names, wife thinks poor hearing plays big role. With heat, he has worse MG symptoms.  HISTORY  01/06/2021 Dr. Anne Hahn: Edward [name] is a 77 year old right-handed white male with a history of myasthenia gravis primarily with ocular features.  The patient has a significant peripheral neuropathy associated with bilateral foot drops and some gait instability.  Within the last 6 months he has reported some heartburn type symptoms but he also he has had some right shoulder and neck discomfort with pain down the right arm.  He has seen Dr. Dutch Quint from neurosurgery, MRI of the cervical spine was done recently, I do not have the report of this.  The patient has had some medication alterations through cardiology.  He currently is on prednisone 6 mg daily and he takes Mestinon 60 mg, 1/2 tablet 3 times daily.  He is not sleeping well.  He reports some occasional dizziness when he stands up off and on.  The heat of the summer has worsened some of his myasthenic symptoms, he does have some generalized fatigue.  He reports occasional double vision and slight ptosis.  He reports no weakness of the arms or legs or difficulties with chewing or swallowing.  REVIEW OF SYSTEMS: Out of a complete 14 system review of symptoms, the  patient complains only of the following symptoms, and all other reviewed systems are negative.  See HPI  ALLERGIES: No Known Allergies  HOME MEDICATIONS: Outpatient Medications Prior to Visit  Medication Sig Dispense Refill   allopurinol (ZYLOPRIM) 300 MG tablet Take 300 mg by mouth in the morning.     amLODipine (NORVASC) 5 MG tablet Take 1 tablet (5 mg total) by mouth daily. 90 tablet 3   amoxicillin (AMOXIL) 500 MG tablet Take 2 g (4 tablets) 30 mins prior to dental cleaning. 4 tablet 2    aspirin EC 81 MG tablet Take 81 mg by mouth in the morning. Swallow whole.     atorvastatin (LIPITOR) 80 MG tablet TAKE 1 TABLET BY MOUTH EVERY DAY 90 tablet 1   Biotin 1000 MCG tablet Take 1,000 mcg by mouth in the morning.     carbamide peroxide (DEBROX) 6.5 % OTIC solution 5 drops 2 (two) times a week. Wax build up     fish oil-omega-3 fatty acids 1000 MG capsule Take 1 g by mouth in the morning and at bedtime.     losartan (COZAAR) 50 MG tablet Take 1 tablet (50 mg total) by mouth daily. (Patient taking differently: Take 50 mg by mouth every evening.) 90 tablet 3   pantoprazole (PROTONIX) 40 MG tablet Take 40 mg by mouth daily.     Polyethyl Glycol-Propyl Glycol (LUBRICANT EYE DROPS) 0.4-0.3 % SOLN Place 1-2 drops into both eyes 3 (three) times daily as needed (tired/dry/irritated eyes.).     XARELTO 20 MG TABS tablet Take 20 mg by mouth in the morning.     pyridostigmine (MESTINON) 60 MG tablet Take 1 tablet (60 mg total) by mouth 3 (three) times daily. 90 tablet 3   No facility-administered medications prior to visit.    PAST MEDICAL HISTORY: 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 by echo 07/2017   Chronic kidney disease    kidney stones   Colon polyps    Coronary artery disease 2022   Diplopia 10/15/2015   Diverticulosis    DVT (deep venous thrombosis)    2018, 2019   Erectile dysfunction    GERD (gastroesophageal reflux disease)    with Barrett's esophagus   Gout    Hearing loss    hearing aides   Heart murmur    pt says he has had it since he was a child, no issues ever mentioned   HTN (hypertension)    Hypercholesteremia    Migraine headache    Myasthenia gravis    Obesity    OSA (obstructive sleep apnea)    AHI 43/hr now on CPAP at 10cm H2O   Peripheral neuropathy 01/14/2019   feet   Right foot drop 12/03/2018    PAST SURGICAL HISTORY: Past  Surgical History:  Procedure Laterality Date   AORTIC VALVE REPLACEMENT N/A 03/03/2021   Procedure: AORTIC VALVE REPLACEMENT (AVR) WITH BIOPROSTHETIC VALVE, Inpiris Resila Aortic Valve 79mm;  Surgeon: Corliss Skains, MD;  Location: [facility] OR;  Service: Open Heart Surgery;  Laterality: N/A;   CORONARY ARTERY BYPASS GRAFT N/A 03/03/2021   Procedure: CORONARY ARTERY BYPASS GRAFTING (CABG) x four on pump, using left internal mammary artery, left and right endoscopic greater saphenous veins conduits;  Surgeon: Corliss Skains, MD;  Location: [facility] OR;  Service: Open Heart Surgery;  Laterality: N/A;   ENDOVEIN HARVEST OF  GREATER SAPHENOUS VEIN Bilateral 03/03/2021   Procedure: ENDOVEIN HARVEST OF GREATER SAPHENOUS VEIN;  Surgeon: Corliss Skains, MD;  Location: [facility] OR;  Service: Open Heart Surgery;  Laterality: Bilateral;   laser assisted uvuloplasty     MEDIASTINAL EXPLORATION N/A 03/03/2021   Procedure: MEDIASTINAL EXPLORATION;  Surgeon: Corliss Skains, MD;  Location: [facility] OR;  Service: Open Heart Surgery;  Laterality: N/A;   RIGHT/LEFT HEART CATH AND CORONARY ANGIOGRAPHY N/A 02/11/2021   Procedure: RIGHT/LEFT HEART CATH AND CORONARY ANGIOGRAPHY;  Surgeon: Swaz[place], Peter M, MD;  Location: [facility] INVASIVE CV LAB;  Service: Cardiovascular;  Laterality: N/A;   TEE WITHOUT CARDIOVERSION N/A 03/03/2021   Procedure: TRANSESOPHAGEAL ECHOCARDIOGRAM (TEE);  Surgeon: Corliss Skains, MD;  Location: [facility];  Service: Open Heart Surgery;  Laterality: N/A;    FAMILY HISTORY: Family History  Problem Relation Age of Onset   Hypertension Mother    Heart attack Father    Heart disease Father    Hypertension Sister    Heart disease Brother    Hypertension Brother    Hypertension Brother    Anemia Brother    Cancer Brother     SOCIAL HISTORY: Social History   Socioeconomic History   Marital status: Married    Spouse name: [name]   Number of children: 2   Years of education: [employer]  education level: Not on file  Occupational History   Occupation: Retired  Tobacco Use   Smoking status: Never   Smokeless tobacco: Never  Building services engineer Use: Never used  Substance and Sexual Activity   Alcohol use: Not Currently   Drug use: No   Sexual activity: Not on file  Other Topics Concern   Not on file  Social History Narrative   Lives home w/ his wife   Right-handed    Drinks about 3 cups of caffeine per day   Social Determinants of Health   Financial Resource Strain: Not on file  Food Insecurity: Not on file  Transportation Needs: Not on file  Physical Activity: Not on file  Stress: Not on file  Social Connections: Not on file  Intimate Partner Violence: Not on file    PHYSICAL EXAM  Vitals:   08/02/22 0941  BP: 137/72  Pulse: 62  Weight: 191 lb (86.6 kg)  Height: 5\' 9"  (1.753 m)    Body mass index is 28.21 kg/m.  Generalized: Well developed, in no acute distress, hard of hearing Neurological examination  Mentation: Alert oriented to time, place, history taking. Follows all commands speech and language fluent Cranial nerve II-XII: Pupils were equal round reactive to light. Extraocular movements were full, visual field were full on confrontational test. Facial sensation and strength were normal. Head turning and shoulder shrug were normal and symmetric.  Mild eye open weakness.  No ptosis or diplopia. Motor: Good strength all extremities, no significant muscle weakness, there is bilateral foot drop Sensory: Length dependent sensory deficit to soft touch to lower extremities Coordination: Cerebellar testing reveals good finger-nose-finger and heel-to-shin bilaterally.  Gait and station: Bilateral foot drop, gait is slightly wide based but steady, no assistive device Reflexes: Deep tendon reflexes are symmetric but are decreased   DIAGNOSTIC DATA (LABS, IMAGING, TESTING) - I reviewed patient records, labs, notes, testing and imaging myself where  available.  Lab Results  Component Value Date   WBC 16.5 (H) 03/17/2021   HGB 10.8 (L) 03/17/2021   HCT 32.5 (L) 03/17/2021   MCV 91 03/17/2021  PLT 543 (H) 03/17/2021      Component Value Date/Time   NA 140 06/08/2021 1203   K 5.2 06/08/2021 1203   CL 104 06/08/2021 1203   CO2 24 06/08/2021 1203   GLUCOSE 110 (H) 06/08/2021 1203   GLUCOSE 104 (H) 03/12/2021 0241   BUN 25 06/08/2021 1203   CREATININE 1.33 (H) 06/08/2021 1203   CALCIUM 9.5 06/08/2021 1203   PROT 6.9 03/17/2021 1023   ALBUMIN 4.1 03/17/2021 1023   AST 37 03/17/2021 1023   ALT 37 03/17/2021 1023   ALKPHOS 82 03/17/2021 1023   BILITOT 0.7 03/17/2021 1023   GFRNONAA 45 (L) 03/12/2021 0241   GFRAA 65 12/12/2019 0948   Lab Results  Component Value Date   CHOL 128 04/22/2021   HDL 39 (L) 04/22/2021   LDLCALC 61 04/22/2021   TRIG 165 (H) 04/22/2021   CHOLHDL 3.3 04/22/2021   Lab Results  Component Value Date   HGBA1C 6.2 (H) 03/01/2021   Lab Results  Component Value Date   VITAMINB12 432 01/14/2019   Lab Results  Component Value Date   TSH 1.500 10/15/2015   Otila KluverSarah Sly Parlee, AGNP-C, DNP  [facility] [address], [address] [place], [place] [address] [phone]

## 2022-08-08 DIAGNOSIS — M65311 Trigger thumb, right thumb: Secondary | ICD-10-CM | POA: Diagnosis not present

## 2022-08-08 DIAGNOSIS — M65352 Trigger finger, left little finger: Secondary | ICD-10-CM | POA: Diagnosis not present

## 2022-08-08 DIAGNOSIS — M65341 Trigger finger, right ring finger: Secondary | ICD-10-CM | POA: Diagnosis not present

## 2022-08-22 ENCOUNTER — Encounter: Payer: Self-pay | Admitting: Cardiology

## 2022-08-22 DIAGNOSIS — G4733 Obstructive sleep apnea (adult) (pediatric): Secondary | ICD-10-CM | POA: Diagnosis not present

## 2022-08-22 DIAGNOSIS — E785 Hyperlipidemia, unspecified: Secondary | ICD-10-CM

## 2022-08-23 NOTE — Telephone Encounter (Signed)
Recommend switch to rosuvastatin 10 mg daily and check fasting lipid panel in 2 months

## 2022-08-24 MED ORDER — ROSUVASTATIN CALCIUM 10 MG PO TABS: 10.0000 mg | ORAL_TABLET | Freq: Every day | ORAL | 3 refills | Status: DC

## 2022-08-28 ENCOUNTER — Other Ambulatory Visit: Payer: Self-pay | Admitting: Cardiology

## 2022-09-06 DIAGNOSIS — H2511 Age-related nuclear cataract, right eye: Secondary | ICD-10-CM | POA: Diagnosis not present

## 2022-09-06 DIAGNOSIS — H25811 Combined forms of age-related cataract, right eye: Secondary | ICD-10-CM | POA: Diagnosis not present

## 2022-09-09 DIAGNOSIS — Z86010 Personal history of colonic polyps: Secondary | ICD-10-CM | POA: Diagnosis not present

## 2022-09-09 DIAGNOSIS — K219 Gastro-esophageal reflux disease without esophagitis: Secondary | ICD-10-CM | POA: Diagnosis not present

## 2022-09-09 DIAGNOSIS — K2270 Barrett's esophagus without dysplasia: Secondary | ICD-10-CM | POA: Diagnosis not present

## 2022-09-20 ENCOUNTER — Other Ambulatory Visit: Payer: Self-pay | Admitting: Diagnostic Neuroimaging

## 2022-09-20 DIAGNOSIS — H2512 Age-related nuclear cataract, left eye: Secondary | ICD-10-CM | POA: Diagnosis not present

## 2022-09-20 DIAGNOSIS — H25812 Combined forms of age-related cataract, left eye: Secondary | ICD-10-CM | POA: Diagnosis not present

## 2022-09-29 ENCOUNTER — Other Ambulatory Visit: Payer: Self-pay

## 2022-09-29 DIAGNOSIS — E785 Hyperlipidemia, unspecified: Secondary | ICD-10-CM

## 2022-09-29 LAB — LIPID PANEL
Chol/HDL Ratio: 2.7 ratio (ref 0.0–5.0)
Cholesterol, Total: 134 mg/dL (ref 100–199)
HDL: 50 mg/dL (ref >39)
LDL Chol Calc (NIH): 68 mg/dL (ref 0–99)
Triglycerides: 85 mg/dL (ref 0–149)
VLDL Cholesterol Cal: 16 mg/dL (ref 5–40)

## 2022-10-07 DIAGNOSIS — M65311 Trigger thumb, right thumb: Secondary | ICD-10-CM | POA: Diagnosis not present

## 2022-11-02 DIAGNOSIS — I10 Essential (primary) hypertension: Secondary | ICD-10-CM | POA: Diagnosis not present

## 2022-11-02 DIAGNOSIS — E663 Overweight: Secondary | ICD-10-CM | POA: Diagnosis not present

## 2022-11-02 DIAGNOSIS — G4733 Obstructive sleep apnea (adult) (pediatric): Secondary | ICD-10-CM | POA: Diagnosis not present

## 2022-11-04 DIAGNOSIS — R252 Cramp and spasm: Secondary | ICD-10-CM | POA: Diagnosis not present

## 2022-11-04 DIAGNOSIS — I10 Essential (primary) hypertension: Secondary | ICD-10-CM | POA: Diagnosis not present

## 2022-11-04 DIAGNOSIS — R7303 Prediabetes: Secondary | ICD-10-CM | POA: Diagnosis not present

## 2022-11-04 DIAGNOSIS — I480 Paroxysmal atrial fibrillation: Secondary | ICD-10-CM | POA: Diagnosis not present

## 2022-11-04 DIAGNOSIS — D6869 Other thrombophilia: Secondary | ICD-10-CM | POA: Diagnosis not present

## 2022-11-04 DIAGNOSIS — G7000 Myasthenia gravis without (acute) exacerbation: Secondary | ICD-10-CM | POA: Diagnosis not present

## 2022-11-04 DIAGNOSIS — N1831 Chronic kidney disease, stage 3a: Secondary | ICD-10-CM | POA: Diagnosis not present

## 2022-11-04 DIAGNOSIS — E782 Mixed hyperlipidemia: Secondary | ICD-10-CM | POA: Diagnosis not present

## 2022-11-04 DIAGNOSIS — R197 Diarrhea, unspecified: Secondary | ICD-10-CM | POA: Diagnosis not present

## 2022-11-04 DIAGNOSIS — I825Z9 Chronic embolism and thrombosis of unspecified deep veins of unspecified distal lower extremity: Secondary | ICD-10-CM | POA: Diagnosis not present

## 2022-11-07 DIAGNOSIS — R197 Diarrhea, unspecified: Secondary | ICD-10-CM | POA: Diagnosis not present

## 2022-11-16 DIAGNOSIS — L308 Other specified dermatitis: Secondary | ICD-10-CM | POA: Diagnosis not present

## 2022-11-16 DIAGNOSIS — B356 Tinea cruris: Secondary | ICD-10-CM | POA: Diagnosis not present

## 2022-11-16 DIAGNOSIS — Z85828 Personal history of other malignant neoplasm of skin: Secondary | ICD-10-CM | POA: Diagnosis not present

## 2022-11-18 DIAGNOSIS — M65352 Trigger finger, left little finger: Secondary | ICD-10-CM | POA: Diagnosis not present

## 2022-11-21 NOTE — Progress Notes (Signed)
Cardiology Office Note:    Date:  11/22/2022   ID:  Edward [name], DOB Jan 02, 1946, MRN [id number]  PCP:  Farris Has, MD  Cardiologist:  Little Ishikawa, MD  Electrophysiologist:  Lanier Prude, MD   Referring MD: Farris Has, MD   Chief Complaint  Patient presents with   Coronary Artery Disease     History of Present Illness:    Edward [name] is a 77 y.o. male with a hx of CAD s/p CABG, aortic stenosis, hypertension, hyperlipidemia, myasthenia gravis, OSA, DVT who presents for follow-up.  He was referred by Dr. Kateri Plummer for evaluation of dyspnea and chest pain, initially seen on 09/29/2020.  Echocardiogram 07/27/2017 showed LVEF 60 to 65%, grade 1 diastolic dysfunction, functionally bicuspid aortic valve with mild to moderate aortic stenosis and mild aortic regurgitation, mild MR, mild TR. Echocardiogram on 10/14/2020 showed normal biventricular function, mild LVH, mild to moderate aortic stenosis, mild aortic regurgitation, mild mitral regurgitation.  Lexiscan Myoview on 11/05/2020 showed fixed inferior defect likely representing artifact, no ischemia, EF 52%.  Calcium score in 11/30/2020 was 674 (72nd percentile).  Continued to have chest pain and underwent cath on 02/11/2021 which showed severe multivessel CAD, moderate AS, normal filling pressures.  Underwent CABG x4 (LIMA-LAD, SVG-PDA, SVG-OM, SVG-D2 Y graft off OM), aortic valve replacement with 25 mm Inspiris Resilia on 03/03/2021.  Postop course was complicated by A. fib which was managed with amiodarone and beta-blocker.  Also developed thrombocytopenia down to 62 but recovery without intervention.  Echocardiogram on 04/30/21 showed EF 50 to 55%, mildly reduced RV function, normal functioning bioprosthetic aortic valve.  Zio patch x3 days on 08/24/2021 showed rare PVCs (less than 1% of beats).  Since last clinic visit, he reports he is doing okay.  Denies any chest pain, dyspnea, lower extremity edema, or palpitations.  Reports some  lightheadedness but denies any syncope.  Reports compliance with CPAP.  States that he walks regularly.  Has been having myalgias in legs.  Wt Readings from Last 3 Encounters:  11/22/22 190 lb 12.8 oz (86.5 kg)  08/02/22 191 lb (86.6 kg)  03/30/22 195 lb 12.8 oz (88.8 kg)      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 by echo 07/2017   Chronic kidney disease    kidney stones   Colon polyps    Coronary artery disease 2022   Diplopia 10/15/2015   Diverticulosis    DVT (deep venous thrombosis) (HCC)    2018, 2019   Erectile dysfunction    GERD (gastroesophageal reflux disease)    with Barrett's esophagus   Gout    Hearing loss    hearing aides   Heart murmur    pt says he has had it since he was a child, no issues ever mentioned   HTN (hypertension)    Hypercholesteremia    Migraine headache    Myasthenia gravis (HCC)    Obesity    OSA (obstructive sleep apnea)    AHI 43/hr now on CPAP at 10cm H2O   Peripheral neuropathy 01/14/2019   feet   Right foot drop 12/03/2018    Past Surgical History:  Procedure Laterality Date   AORTIC VALVE REPLACEMENT N/A 03/03/2021   Procedure: AORTIC VALVE REPLACEMENT (AVR) WITH BIOPROSTHETIC VALVE, Inpiris Resila Aortic Valve 25mm;  Surgeon: Corliss Skains, MD;  Location: [facility] OR;  Service: Open Heart Surgery;  Laterality: N/A;   CORONARY ARTERY BYPASS GRAFT N/A 03/03/2021   Procedure: CORONARY ARTERY BYPASS GRAFTING (CABG) x four on pump, using left internal mammary artery, left and right endoscopic greater saphenous veins conduits;  Surgeon: Corliss Skains, MD;  Location: [facility] OR;  Service: Open Heart Surgery;  Laterality: N/A;   ENDOVEIN HARVEST OF GREATER SAPHENOUS VEIN Bilateral 03/03/2021   Procedure: ENDOVEIN HARVEST OF GREATER SAPHENOUS VEIN;  Surgeon: Corliss Skains, MD;  Location: [facility] OR;  Service: Open Heart  Surgery;  Laterality: Bilateral;   laser assisted uvuloplasty     MEDIASTINAL EXPLORATION N/A 03/03/2021   Procedure: MEDIASTINAL EXPLORATION;  Surgeon: Corliss Skains, MD;  Location: [facility] OR;  Service: Open Heart Surgery;  Laterality: N/A;   RIGHT/LEFT HEART CATH AND CORONARY ANGIOGRAPHY N/A 02/11/2021   Procedure: RIGHT/LEFT HEART CATH AND CORONARY ANGIOGRAPHY;  Surgeon: [place], Peter M, MD;  Location: [facility] INVASIVE CV LAB;  Service: Cardiovascular;  Laterality: N/A;   TEE WITHOUT CARDIOVERSION N/A 03/03/2021   Procedure: TRANSESOPHAGEAL ECHOCARDIOGRAM (TEE);  Surgeon: Corliss Skains, MD;  Location: [facility] OR;  Service: Open Heart Surgery;  Laterality: N/A;    Current Medications: Current Meds  Medication Sig   allopurinol (ZYLOPRIM) 300 MG tablet Take 300 mg by mouth in the morning.   amLODipine (NORVASC) 5 MG tablet Take 1 tablet (5 mg total) by mouth daily.   Azelastine HCl 137 MCG/SPRAY SOLN Place into both nostrils.   Biotin 1000 MCG tablet Take 1,000 mcg by mouth in the morning.   carbamide peroxide (DEBROX) 6.5 % OTIC solution 5 drops 2 (two) times a week. Wax build up   ELIQUIS 5 MG TABS tablet Take 5 mg by mouth 2 (two) times daily.   fish oil-omega-3 fatty acids 1000 MG capsule Take 1 g by mouth in the morning and at bedtime.   losartan (COZAAR) 50 MG tablet Take 1 tablet (50 mg total) by mouth daily. (Patient taking differently: Take 50 mg by mouth every evening.)   pantoprazole (PROTONIX) 40 MG tablet Take 40 mg by mouth daily.   Polyethyl Glycol-Propyl Glycol (LUBRICANT EYE DROPS) 0.4-0.3 % SOLN Place 1-2 drops into both eyes 3 (three) times daily as needed (tired/dry/irritated eyes.).   pyridostigmine (MESTINON) 60 MG tablet Take 1 tablet (60 mg total) by mouth 3 (three) times daily.   [DISCONTINUED] aspirin EC 81 MG tablet Take 81 mg by mouth in the morning. Swallow whole.   [DISCONTINUED] rosuvastatin (CRESTOR) 10 MG tablet Take 1 tablet (10 mg total) by mouth daily.      Allergies:   Patient has no known allergies.   Social History   Socioeconomic History   Marital status: Married    Spouse name: [name]   Number of children: 2   Years of education: [employer] education level: Not on file  Occupational History   Occupation: Retired  Tobacco Use   Smoking status: Never   Smokeless tobacco: Never  Vaping Use   Vaping status: Never Used  Substance and Sexual Activity   Alcohol use: Not Currently   Drug use: No   Sexual activity: Not on file  Other Topics Concern   Not on file  Social History Narrative   Lives home w/ his wife   Right-handed    Drinks about 3 cups of caffeine per day   Social Determinants of Health   Financial Resource Strain: Not on file  Food Insecurity: Not on file  Transportation Needs:  Not on file  Physical Activity: Not on file  Stress: Not on file  Social Connections: Not on file     Family History: The patient's family history includes Anemia in his brother; Cancer in his brother; Heart attack in his father; Heart disease in his brother and father; Hypertension in his brother, brother, mother, and sister.  ROS:   Please see the history of present illness.    All other systems reviewed and are negative.  EKGs/Labs/Other Studies Reviewed:    The following studies were reviewed today:  [place] LE Venous 08/01/2017: Final Interpretation:  Right: There is evidence of acute DVT in the Posterior Tibial veins.  Ultrasound is unable to distinguish whether obstruction in the Femoral  vein, and Popliteal vein is acute or chronic.   TTE 07/27/2017: - Left ventricle: The cavity size was normal. There was mild    concentric hypertrophy. Systolic function was normal. The    estimated ejection fraction was in the range of 60% to 65%. Wall    motion was normal; there were no regional wall motion    abnormalities. Doppler parameters are consistent with abnormal    left ventricular relaxation (grade 1 diastolic  dysfunction).  - Aortic valve: Functionally bicuspid; moderately thickened,    moderately calcified leaflets. There was mild to moderate    stenosis. There was mild regurgitation. Peak gradient (S): 25 mm    Hg. Valve area (VTI): 1.24 cm^2. Valve area (Vmax): 1.42 cm^2.    Valve area (Vmean): 1.37 cm^2.  - Mitral valve: There was mild regurgitation.  - Left atrium: The atrium was at the upper limits of normal in    size.  - Right ventricle: The cavity size was normal. Wall thickness was    normal. Systolic function was normal.  - Tricuspid valve: There was mild regurgitation.  - Pulmonary arteries: Systolic pressure was within the normal    range.   US Venous LE Doppler 06/27/2016: 1. Positive for acute to subacute deep venous thrombosis extending from the popliteal vein into the common femoral vein. The common femoral and femoral venous thrombus is nonocclusive while the popliteal venous thrombus is occlusive.  Echo 08/31/2015: - Left ventricle: The cavity size was normal. Wall thickness was    normal. Systolic function was vigorous. The estimated ejection    fraction was in the range of 65% to 70%. Wall motion was normal;    there were no regional wall motion abnormalities. Doppler    parameters are consistent with abnormal left ventricular    relaxation (grade 1 diastolic dysfunction).  - Aortic valve: There was mild regurgitation. Mean gradient (S): 9    mm Hg. Peak gradient (S): 15 mm Hg. Valve area (VTI): 1.98 cm^2.    Valve area (Vmax): 2.07 cm^2. Valve area (Vmean): 1.87 cm^2.  - Pulmonary arteries: Systolic pressure was mildly increased. PA    peak pressure: 32 mm Hg (S).   EKG:  11/22/2022: Sinus bradycardia, rate 51, nonspecific T wave flattening 03/30/2022: Normal sinus rhythm, rate 60, nonspecific T wave flattening, no PVCs 03/17/21: Sinus bradycardia, rate 52, less than 1 mm ST depressions in leads V3-6, nonspecific T wave flattening 12/08/20: Sinus rhythm, PVCs, rate 71,  Q waves in lead III 11/09/2020: no ekg ordered today.  09/29/2020: NSR, PACs, PVCs, nonspecific T wave flattening, rate 84 bpm   Recent Labs: No results found for requested labs within last 365 days.  Recent Lipid Panel    Component Value Date/Time   CHOL 134  09/29/2022 0935   TRIG 85 09/29/2022 0935   HDL 50 09/29/2022 0935   CHOLHDL 2.7 09/29/2022 0935   LDLCALC 68 09/29/2022 0935    Physical Exam:    VS:  BP 118/60 (BP Location: Left Arm, Patient Position: Sitting, Cuff Size: Normal)   Pulse (!) 58   Ht 5\' 9"  (1.753 m)   Wt 190 lb 12.8 oz (86.5 kg)   SpO2 95%   BMI 28.18 kg/m     Wt Readings from Last 3 Encounters:  11/22/22 190 lb 12.8 oz (86.5 kg)  08/02/22 191 lb (86.6 kg)  03/30/22 195 lb 12.8 oz (88.8 kg)     GEN: Well nourished, well developed in no acute distress HEENT: Normal NECK: No JVD; No carotid bruits CARDIAC: RRR, no murmur RESPIRATORY:  Clear to auscultation without rales, wheezing or rhonchi  ABDOMEN: Soft, non-tender, non-distended MUSCULOSKELETAL:  Trace RLE edema.  Incisions c/d/i SKIN: Warm and dry NEUROLOGIC:  Alert and oriented x 3 PSYCHIATRIC:  Normal affect   ASSESSMENT:    1. Coronary artery disease involving native coronary artery of native heart without angina pectoris   2. Hyperlipidemia, unspecified hyperlipidemia type   3. Essential hypertension   4. S/P AVR   5. PVC's (premature ventricular contractions)      PLAN:    CAD: underwent cath on 02/11/2021 which showed severe multivessel CAD, moderate AS, normal filling pressures.  Underwent CABG x4 (LIMA-LAD, SVG-PDA, SVG-OM, SVG-D2 Y graft off OM) on 03/03/21 -Continue Eliquis, can stop aspirin since on anticoagulation -Reported myalgias with atorvastatin, was discontinued and switched to rosuvastatin.  Continued to have myalgias.  Will stop statin and refer to pharmacy lipid clinic to evaluate for PCSK9 inhibitor -Stopped metoprolol due to his myasthenia  Aortic stenosis s/p  AVR: Mild to moderate on echo 07/2017.  Echocardiogram on 10/14/2020 showed normal biventricular function, mild LVH, mild to moderate aortic stenosis, mild aortic regurgitation, mild mitral regurgitation.  Cath 02/11/2021 suggested moderate aortic stenosis.  Underwent aortic valve replacement with 25 mm Inspiris Resilia on 03/03/2021 at time of CABG.  Echocardiogram on 04/30/21 showed EF 50 to 55%, mildly reduced RV function, normal functioning bioprosthetic aortic valve.  Hyperlipidemia: On received in 10 mg daily, LDL 68 on 09/29/22.  Previously did not tolerate atorvastatin due to myalgias and now having myalgias with rosuvastatin.  Refer to pharmacy lipid clinic as above  Hypertension: On amlodipine 5 mg daily and losartan 50 mg daily prior to CABG. Switched to metoprolol 25 mg twice daily following CABG but was discontinued due to worsening myasthenia symptoms.  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  Zio patch x3 days 12/16/2020 showed 95 episodes of NSVT with longest lasting 12 beats and frequent PVCs (14% of beats).  Suspect related to ischemia in setting of severe multivessel disease.  Zio patch x3 days on 08/24/2021 showed rare PVCs (less than 1% of beats).  Paroxysmal atrial fibrillation: Occurred in postoperative setting after CABG.  Loaded with amiodarone, has been discontinued.  Continued on Eliquis.  Currently in sinus rhythm.  OSA: on CPAP, reports compliance  RTC in 6 months   Medication Adjustments/Labs and Tests Ordered: Current medicines are reviewed at length with the patient today.  Concerns regarding medicines are outlined above.  Orders Placed This Encounter  Procedures   AMB Referral to [facility] Pharm-D   EKG 12-Lead  No orders of the defined types were placed in this encounter.     Patient Instructions  Medication Instructions:  Stop  Aspirin Stop Rosuvastatin *If you need a refill on your cardiac medications before your next appointment, please call your pharmacy*  Follow-Up: At [facility], you and your health needs are our priority.  As part of our continuing mission to provide you with exceptional heart care, we have created designated Provider Care Teams.  These Care Teams include your primary Cardiologist (physician) and Advanced Practice Providers (APPs -  Physician Assistants and Nurse Practitioners) who all work together to provide you with the care you need, when you need it.  We recommend signing up for the patient portal called "MyChart".  Sign up information is provided on this After Visit Summary.  MyChart is used to connect with patients for Virtual Visits (Telemedicine).  Patients are able to view lab/test results, encounter notes, upcoming appointments, etc.  Non-urgent messages can be sent to your provider as well.   To learn more about what you can do with MyChart, go to [email or web address].    Your next appointment:  2 weeks- PharmD- Lipids  6 month(s)  Little Ishikawa, MD          Signed, Little Ishikawa, MD  11/22/2022 6:05 PM    [facility]

## 2022-11-22 ENCOUNTER — Ambulatory Visit: Payer: Medicare PPO | Attending: Cardiology | Admitting: Cardiology

## 2022-11-22 VITALS — BP 118/60 | HR 58 | Ht 69.0 in | Wt 190.8 lb

## 2022-11-22 DIAGNOSIS — Z952 Presence of prosthetic heart valve: Secondary | ICD-10-CM

## 2022-11-22 DIAGNOSIS — I10 Essential (primary) hypertension: Secondary | ICD-10-CM

## 2022-11-22 DIAGNOSIS — E785 Hyperlipidemia, unspecified: Secondary | ICD-10-CM

## 2022-11-22 DIAGNOSIS — I2510 Atherosclerotic heart disease of native coronary artery without angina pectoris: Secondary | ICD-10-CM | POA: Diagnosis not present

## 2022-11-22 DIAGNOSIS — I493 Ventricular premature depolarization: Secondary | ICD-10-CM

## 2022-11-22 NOTE — Patient Instructions (Signed)
Medication Instructions:  Stop Aspirin Stop Rosuvastatin *If you need a refill on your cardiac medications before your next appointment, please call your pharmacy*  Follow-Up: At [facility], you and your health needs are our priority.  As part of our continuing mission to provide you with exceptional heart care, we have created designated Provider Care Teams.  These Care Teams include your primary Cardiologist (physician) and Advanced Practice Providers (APPs -  Physician Assistants and Nurse Practitioners) who all work together to provide you with the care you need, when you need it.  We recommend signing up for the patient portal called "MyChart".  Sign up information is provided on this After Visit Summary.  MyChart is used to connect with patients for Virtual Visits (Telemedicine).  Patients are able to view lab/test results, encounter notes, upcoming appointments, etc.  Non-urgent messages can be sent to your provider as well.   To learn more about what you can do with MyChart, go to [email or web address].    Your next appointment:  2 weeks- PharmD- Lipids  6 month(s)  Little Ishikawa, MD

## 2022-11-23 DIAGNOSIS — G4733 Obstructive sleep apnea (adult) (pediatric): Secondary | ICD-10-CM | POA: Diagnosis not present

## 2022-11-30 ENCOUNTER — Other Ambulatory Visit: Payer: Self-pay | Admitting: Gastroenterology

## 2022-11-30 DIAGNOSIS — K529 Noninfective gastroenteritis and colitis, unspecified: Secondary | ICD-10-CM

## 2022-11-30 DIAGNOSIS — R634 Abnormal weight loss: Secondary | ICD-10-CM

## 2022-12-07 ENCOUNTER — Ambulatory Visit
Admission: RE | Admit: 2022-12-07 | Discharge: 2022-12-07 | Disposition: A | Discharge status: Home / Self Care | Payer: Medicare PPO | Source: Ambulatory Visit | Attending: Gastroenterology | Admitting: Gastroenterology

## 2022-12-07 DIAGNOSIS — N200 Calculus of kidney: Secondary | ICD-10-CM | POA: Diagnosis not present

## 2022-12-07 DIAGNOSIS — R634 Abnormal weight loss: Secondary | ICD-10-CM

## 2022-12-07 DIAGNOSIS — K5790 Diverticulosis of intestine, part unspecified, without perforation or abscess without bleeding: Secondary | ICD-10-CM | POA: Diagnosis not present

## 2022-12-07 DIAGNOSIS — K449 Diaphragmatic hernia without obstruction or gangrene: Secondary | ICD-10-CM | POA: Diagnosis not present

## 2022-12-07 DIAGNOSIS — K529 Noninfective gastroenteritis and colitis, unspecified: Secondary | ICD-10-CM

## 2022-12-12 ENCOUNTER — Encounter: Payer: Self-pay | Admitting: Pharmacist

## 2022-12-12 ENCOUNTER — Ambulatory Visit: Payer: Medicare PPO | Attending: Cardiology | Admitting: Pharmacist

## 2022-12-12 DIAGNOSIS — G720 Drug-induced myopathy: Secondary | ICD-10-CM | POA: Diagnosis not present

## 2022-12-12 DIAGNOSIS — T466X5A Adverse effect of antihyperlipidemic and antiarteriosclerotic drugs, initial encounter: Secondary | ICD-10-CM | POA: Diagnosis not present

## 2022-12-12 DIAGNOSIS — E785 Hyperlipidemia, unspecified: Secondary | ICD-10-CM | POA: Diagnosis not present

## 2022-12-12 DIAGNOSIS — I2510 Atherosclerotic heart disease of native coronary artery without angina pectoris: Secondary | ICD-10-CM

## 2022-12-12 DIAGNOSIS — R931 Abnormal findings on diagnostic imaging of heart and coronary circulation: Secondary | ICD-10-CM | POA: Diagnosis not present

## 2022-12-12 DIAGNOSIS — K529 Noninfective gastroenteritis and colitis, unspecified: Secondary | ICD-10-CM | POA: Insufficient documentation

## 2022-12-12 NOTE — Progress Notes (Unsigned)
Patient ID: Edward [name]                 DOB: [birth year]                    MRN: [id number]     HPI: Edward [name] is a 77 y.o. male patient referred to lipid clinic by Dr Bjorn Pippin. PMH is significant for HTN, angina, aortic stenosis.OSA, myasthenia gravis, HLD, and CABG x 4. Patient is statin intolerant.  Patient presents today with wife. His hearing aids have broken so wife is helping to communicate.  Has trialed pravastatin, atorvastatin and most recently rosuvastatin. All caused myalgias.   Patient and wife very concerned regarding medication costs. Reports Eliquis has become too expensive for her. Offered patient assistance application however she declined.   Advised that PCSK9i will require prior authorization and LDL on last lipid panel was 68 so may be denied.  Current Medications: N/A  Intolerances:  Atorvastatin Pravastatin Rosuvastatin  Risk Factors:  CAD CABG Angina  LDL goal: <55  Labs: TC 134, Trigs 85, HDL 50, LDL 68 (09/29/22)  Imaging: Coronary calcium score of 674. This was 72nd percentile for age-, race-, and sex-matched controls.  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 by echo 07/2017   Chronic kidney disease    kidney stones   Colon polyps    Coronary artery disease 2022   Diplopia 10/15/2015   Diverticulosis    DVT (deep venous thrombosis) (HCC)    2018, 2019   Erectile dysfunction    GERD (gastroesophageal reflux disease)    with Barrett's esophagus   Gout    Hearing loss    hearing aides   Heart murmur    pt says he has had it since he was a child, no issues ever mentioned   HTN (hypertension)    Hypercholesteremia    Migraine headache    Myasthenia gravis (HCC)    Obesity    OSA (obstructive sleep apnea)    AHI 43/hr now on CPAP at 10cm H2O   Peripheral neuropathy 01/14/2019   feet   Right foot drop 12/03/2018     Current Outpatient Medications on File Prior to Visit  Medication Sig Dispense Refill   allopurinol (ZYLOPRIM) 300 MG tablet Take 300 mg by mouth in the morning.     amLODipine (NORVASC) 5 MG tablet Take 1 tablet (5 mg total) by mouth daily. 90 tablet 3   Azelastine HCl 137 MCG/SPRAY SOLN Place into both nostrils.     Biotin 1000 MCG tablet Take 1,000 mcg by mouth in the morning.     carbamide peroxide (DEBROX) 6.5 % OTIC solution 5 drops 2 (two) times a week. Wax build up     ELIQUIS 5 MG TABS tablet Take 5 mg by mouth 2 (two) times daily.     fish oil-omega-3 fatty acids 1000 MG capsule Take 1 g by mouth in the morning and at bedtime.     losartan (COZAAR) 50 MG tablet Take 1 tablet (50 mg total) by mouth daily. (Patient taking differently: Take 50 mg by mouth every evening.) 90 tablet 3   pantoprazole (PROTONIX) 40 MG tablet Take 40 mg by mouth daily.     Polyethyl Glycol-Propyl Glycol (LUBRICANT EYE DROPS) 0.4-0.3 % SOLN Place 1-2 drops into both eyes 3 (three) times daily as needed (tired/dry/irritated  eyes.).     pyridostigmine (MESTINON) 60 MG tablet Take 1 tablet (60 mg total) by mouth 3 (three) times daily. 270 tablet 3   rosuvastatin (CRESTOR) 10 MG tablet Take 10 mg by mouth daily.     No current facility-administered medications on file prior to visit.    No Known Allergies  Assessment/Plan:  1. Hyperlipidemia - Patient last LDL 68 which is above goal of <55 however patient is no longer on any lipid lowering medications. May have risen since then. Recommend starting PCSK9i.  Using demo pen, educated patient on mechanism of action, storage, site selection, administration and possible adverse effects. Patient and wife able to demonstrate in room. Will complete PA and contact patient when approved. Advised that if PA is denied, will repeat lipid panel 3 months after previous lab work (approximately 12/30/22) and then resubmit. Patient voiced understanding.  Start Repatha 140mg   q 2 weeks Recheck lipid panel in 2-3 months  Laural Golden, PharmD, BCACP, CDCES, CPP [address] [place], [place], [address] Phone: [phone], Fax: [phone]

## 2022-12-12 NOTE — Patient Instructions (Addendum)
It was nice seeing you two again  We would like your LDL (bad cholesterol) to be less than 55  The medication we discussed today is called Repatha which is an injection you would use once every 2 weeks  I will complete the prior authorization for you and contact you when it is approved  Once you start the medication we would recheck your cholesterol levels 2-3 months later  If they do not cover the medication, we will check your fasting lipid panel anytime after September 6th  Please let me know if you have any questions  Laural Golden, PharmD, BCACP, CDCES, CPP [address] [place], [place], [address] Phone: [phone], Fax: [phone]

## 2022-12-19 ENCOUNTER — Telehealth: Payer: Self-pay | Admitting: Cardiology

## 2022-12-19 NOTE — Telephone Encounter (Signed)
Please advise medication to be called in and how many refills

## 2022-12-19 NOTE — Telephone Encounter (Signed)
Patient's Dental office called stating patient is to be premedicated prior to his dental procedures.  He is coming in this Wednesday for dental work, and they are wondering if we can order antibiotics for him like we did the last time.  They would like for a year worth refills to be sent as well since he needs it even if he comes in for fillings.

## 2022-12-20 MED ORDER — AMOXICILLIN 500 MG PO CAPS: ORAL_CAPSULE | ORAL | 3 refills | Status: DC

## 2022-12-20 NOTE — Telephone Encounter (Signed)
Yes we can send in amoxicillin 2 g to take 30 minutes prior to his dental cleaning.  Can give 3 refills

## 2022-12-20 NOTE — Telephone Encounter (Signed)
Spoke to patient's wife Dr.Schumann advised patient needs to take Amoxicillin 2 grams 1 hour before dental work.Prescription sent to pharmacy.

## 2023-01-02 ENCOUNTER — Telehealth: Payer: Self-pay | Admitting: Pharmacist Clinician (PhC)/ Clinical Pharmacy Specialist

## 2023-01-02 DIAGNOSIS — E785 Hyperlipidemia, unspecified: Secondary | ICD-10-CM | POA: Diagnosis not present

## 2023-01-02 NOTE — Telephone Encounter (Signed)
Lipid labs

## 2023-01-03 LAB — LIPID PANEL
Chol/HDL Ratio: 5.9 ratio — ABNORMAL HIGH (ref 0.0–5.0)
Cholesterol, Total: 248 mg/dL — ABNORMAL HIGH (ref 100–199)
HDL: 42 mg/dL (ref >39)
LDL Chol Calc (NIH): 175 mg/dL — ABNORMAL HIGH (ref 0–99)
Triglycerides: 168 mg/dL — ABNORMAL HIGH (ref 0–149)
VLDL Cholesterol Cal: 31 mg/dL (ref 5–40)

## 2023-01-04 ENCOUNTER — Telehealth: Payer: Self-pay | Admitting: Pharmacy Technician

## 2023-01-04 ENCOUNTER — Other Ambulatory Visit (HOSPITAL_COMMUNITY): Payer: Self-pay

## 2023-01-04 ENCOUNTER — Telehealth: Payer: Self-pay | Admitting: Pharmacist Clinician (PhC)/ Clinical Pharmacy Specialist

## 2023-01-04 ENCOUNTER — Encounter: Payer: Self-pay | Admitting: Cardiology

## 2023-01-04 MED ORDER — REPATHA SURECLICK 140 MG/ML ~~LOC~~ SOAJ: 140.0000 mg | SUBCUTANEOUS | 3 refills | Status: DC

## 2023-01-04 NOTE — Telephone Encounter (Signed)
Pharmacy Patient Advocate Encounter   Received notification from Pt Calls Messages that prior authorization for repatha is required/requested.   Insurance verification completed.   The patient is insured through [employer] .   Per test claim: PA required; PA submitted to [facility] via CoverMyMeds Key/confirmation #/EOC [facility] Status is pending

## 2023-01-04 NOTE — Telephone Encounter (Signed)
Pharmacy Patient Advocate Encounter   Received notification from Pt Calls Messages that prior authorization for repatha is required/requested.   Insurance verification completed.   The patient is insured through [employer] .   Per test claim: PA required; PA started via CoverMyMeds. KEY [id number] . Waiting for clinical questions to populate.

## 2023-01-04 NOTE — Telephone Encounter (Signed)
PA request has been Submitted. New Encounter created for follow up. For additional info see Pharmacy Prior Auth telephone encounter from 01/04/23.

## 2023-01-04 NOTE — Telephone Encounter (Signed)
Please do PA for Repatha 

## 2023-01-04 NOTE — Telephone Encounter (Signed)
Pharmacy Patient Advocate Encounter  Received notification from [facility] that Prior Authorization for repatha has been APPROVED from 04/25/22 to 04/25/23. Ran test claim, Copay is $40.00. This test claim was processed through [facility]- copay amounts may vary at other pharmacies due to pharmacy/plan contracts, or as the patient moves through the different stages of their insurance plan.   PA #/Case ID/Reference #: [id number]

## 2023-01-04 NOTE — Addendum Note (Signed)
Addended by: Rosalee Kaufman on: 01/04/2023 01:46 PM   Modules accepted: Orders

## 2023-01-05 DIAGNOSIS — H903 Sensorineural hearing loss, bilateral: Secondary | ICD-10-CM | POA: Diagnosis not present

## 2023-01-24 DIAGNOSIS — H903 Sensorineural hearing loss, bilateral: Secondary | ICD-10-CM | POA: Diagnosis not present

## 2023-02-20 NOTE — Progress Notes (Deleted)
PATIENT: Edward [name] DOB: 09/21/1945  REASON FOR VISIT: Follow up ocular MG, peripheral neuropathy, bilateral foot drop HISTORY FROM: Patient, wife PRIMARY NEUROLOGIST: Dr. Marjory Lies   ASSESSMENT AND PLAN 77 y.o. year old male   1.  Myasthenia gravis 2.  Peripheral neuropathy  -MG has been stable since stopping prednisone around December 2023 -Continue Mestinon 60 mg 2-3 times a day  -Recommend continue to work on gait and leg strengthening exercises -We discussed AFOs, he wants to think about it -Call for worsening symptoms, follow-up in 6 months or sooner if needed  HISTORY OF PRESENT ILLNESS: Today 02/20/23   07/30/22 SS: Saw Dr. Marjory Lies and last visit, prednisone reduced to 5 mg daily for 1 month then 5 mg every other day, then he stopped it.  He had called in December with some worsening MG symptoms, Mestinon was increased 60 mg 2-3 times a day. Currently taking 60 mg BID, 30 mg midday. MG is doing well. He has some diarrhea, but this is chronic. He has cataracts, no ptosis or diplopia. He has bilateral foot drop. He does stumble and fall. He has lighter shoes that's helps. No back pain, was seeing Dr. Dutch Quint in the past for back pain. He has some general weakness, not any worse since stopping prednisone. He works all the time in the yard, staying active.   UPDATE (01/24/22, VRP): Since last visit, doing well. Mild vision issues. Symptoms are stable. Sleep is poor. Some memory issues.   Update 07/13/21 SS: Edward [name] here today for follow-up. He had reduced prednisone from 6 mg to 5 mg after last visit in September, did okay. A few weeks he increased it back up to 6 mg due to weakness, a lot of exertion with cardiac rehab. Has helped. Is in cardiac rehab post CABG, valve replacement back in November, had symptoms of heart burn. Not taking trazodone, felt groggy the next day, uses CPAP at night. MG symptoms doing well, have had minimal problems with ocular symptoms. No significant  weakness of arms of legs. Doing well cardiac rehab, meeting his goals, this is 5th week. No falls, has cane just in case sometimes. No trouble swallowing or chewing. Does have some trouble with short term memory, remembering names, wife thinks poor hearing plays big role. With heat, he has worse MG symptoms.  HISTORY  01/06/2021 Dr. Anne Hahn: Edward [name] is a 77 year old right-handed white male with a history of myasthenia gravis primarily with ocular features.  The patient has a significant peripheral neuropathy associated with bilateral foot drops and some gait instability.  Within the last 6 months he has reported some heartburn type symptoms but he also he has had some right shoulder and neck discomfort with pain down the right arm.  He has seen Dr. Dutch Quint from neurosurgery, MRI of the cervical spine was done recently, I do not have the report of this.  The patient has had some medication alterations through cardiology.  He currently is on prednisone 6 mg daily and he takes Mestinon 60 mg, 1/2 tablet 3 times daily.  He is not sleeping well.  He reports some occasional dizziness when he stands up off and on.  The heat of the summer has worsened some of his myasthenic symptoms, he does have some generalized fatigue.  He reports occasional double vision and slight ptosis.  He reports no weakness of the arms or legs or difficulties with chewing or swallowing.  REVIEW OF SYSTEMS: Out of a complete 14 system  review of symptoms, the patient complains only of the following symptoms, and all other reviewed systems are negative.  See HPI  ALLERGIES: No Known Allergies  HOME MEDICATIONS: Outpatient Medications Prior to Visit  Medication Sig Dispense Refill   allopurinol (ZYLOPRIM) 300 MG tablet Take 300 mg by mouth in the morning.     amLODipine (NORVASC) 5 MG tablet Take 1 tablet (5 mg total) by mouth daily. 90 tablet 3   amoxicillin (AMOXIL) 500 MG capsule Take 2 Gms ( 4 capsules ) 1 hour before dental work 4  capsule 3   Azelastine HCl 137 MCG/SPRAY SOLN Place into both nostrils.     Biotin 1000 MCG tablet Take 1,000 mcg by mouth in the morning.     carbamide peroxide (DEBROX) 6.5 % OTIC solution 5 drops 2 (two) times a week. Wax build up     ELIQUIS 5 MG TABS tablet Take 5 mg by mouth 2 (two) times daily.     Evolocumab (REPATHA SURECLICK) 140 MG/ML SOAJ Inject 140 mg into the skin every 14 (fourteen) days. 6 mL 3   fish oil-omega-3 fatty acids 1000 MG capsule Take 1 g by mouth in the morning and at bedtime.     losartan (COZAAR) 50 MG tablet Take 1 tablet (50 mg total) by mouth daily. (Patient taking differently: Take 50 mg by mouth every evening.) 90 tablet 3   pantoprazole (PROTONIX) 40 MG tablet Take 40 mg by mouth daily.     Polyethyl Glycol-Propyl Glycol (LUBRICANT EYE DROPS) 0.4-0.3 % SOLN Place 1-2 drops into both eyes 3 (three) times daily as needed (tired/dry/irritated eyes.).     pyridostigmine (MESTINON) 60 MG tablet Take 1 tablet (60 mg total) by mouth 3 (three) times daily. 270 tablet 3   No facility-administered medications prior to visit.    PAST MEDICAL HISTORY: 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 by echo 07/2017   Chronic kidney disease    kidney stones   Colon polyps    Coronary artery disease 2022   Diplopia 10/15/2015   Diverticulosis    DVT (deep venous thrombosis) (HCC)    2018, 2019   Erectile dysfunction    GERD (gastroesophageal reflux disease)    with Barrett's esophagus   Gout    Hearing loss    hearing aides   Heart murmur    pt says he has had it since he was a child, no issues ever mentioned   HTN (hypertension)    Hypercholesteremia    Migraine headache    Myasthenia gravis (HCC)    Obesity    OSA (obstructive sleep apnea)    AHI 43/hr now on CPAP at 10cm H2O   Peripheral neuropathy 01/14/2019   feet   Right foot drop 12/03/2018     PAST SURGICAL HISTORY: Past Surgical History:  Procedure Laterality Date   AORTIC VALVE REPLACEMENT N/A 03/03/2021   Procedure: AORTIC VALVE REPLACEMENT (AVR) WITH BIOPROSTHETIC VALVE, Inpiris Resila Aortic Valve 25mm;  Surgeon: Corliss Skains, MD;  Location: [facility] OR;  Service: Open Heart Surgery;  Laterality: N/A;   CORONARY ARTERY BYPASS GRAFT N/A 03/03/2021   Procedure: CORONARY ARTERY BYPASS GRAFTING (CABG) x four on pump, using left internal mammary artery, left and right endoscopic greater saphenous veins conduits;  Surgeon: Corliss Skains, MD;  Location: [facility] OR;  Service: Open Heart Surgery;  Laterality:  N/A;   ENDOVEIN HARVEST OF GREATER SAPHENOUS VEIN Bilateral 03/03/2021   Procedure: ENDOVEIN HARVEST OF GREATER SAPHENOUS VEIN;  Surgeon: Corliss Skains, MD;  Location: [facility] OR;  Service: Open Heart Surgery;  Laterality: Bilateral;   laser assisted uvuloplasty     MEDIASTINAL EXPLORATION N/A 03/03/2021   Procedure: MEDIASTINAL EXPLORATION;  Surgeon: Corliss Skains, MD;  Location: [facility] OR;  Service: Open Heart Surgery;  Laterality: N/A;   RIGHT/LEFT HEART CATH AND CORONARY ANGIOGRAPHY N/A 02/11/2021   Procedure: RIGHT/LEFT HEART CATH AND CORONARY ANGIOGRAPHY;  Surgeon: Swaziland, Peter M, MD;  Location: [facility] INVASIVE CV LAB;  Service: Cardiovascular;  Laterality: N/A;   TEE WITHOUT CARDIOVERSION N/A 03/03/2021   Procedure: TRANSESOPHAGEAL ECHOCARDIOGRAM (TEE);  Surgeon: Corliss Skains, MD;  Location: [facility] OR;  Service: Open Heart Surgery;  Laterality: N/A;    FAMILY HISTORY: Family History  Problem Relation Age of Onset   Hypertension Mother    Heart attack Father    Heart disease Father    Hypertension Sister    Heart disease Brother    Hypertension Brother    Hypertension Brother    Anemia Brother    Cancer Brother     SOCIAL HISTORY: Social History   Socioeconomic History   Marital status: Married    Spouse name: [name]   Number of children: 2   Years  of education: [employer] education level: Not on file  Occupational History   Occupation: Retired  Tobacco Use   Smoking status: Never   Smokeless tobacco: Never  Vaping Use   Vaping status: Never Used  Substance and Sexual Activity   Alcohol use: Not Currently   Drug use: No   Sexual activity: Not on file  Other Topics Concern   Not on file  Social History Narrative   Lives home w/ his wife   Right-handed    Drinks about 3 cups of caffeine per day   Social Determinants of Health   Financial Resource Strain: Not on file  Food Insecurity: Not on file  Transportation Needs: Not on file  Physical Activity: Not on file  Stress: Not on file  Social Connections: Not on file  Intimate Partner Violence: Not on file    PHYSICAL EXAM  There were no vitals filed for this visit.   There is no height or weight on file to calculate BMI.  Generalized: Well developed, in no acute distress, hard of hearing Neurological examination  Mentation: Alert oriented to time, place, history taking. Follows all commands speech and language fluent Cranial nerve II-XII: Pupils were equal round reactive to light. Extraocular movements were full, visual field were full on confrontational test. Facial sensation and strength were normal. Head turning and shoulder shrug were normal and symmetric.  Mild eye open weakness.  No ptosis or diplopia. Motor: Good strength all extremities, no significant muscle weakness, there is bilateral foot drop Sensory: Length dependent sensory deficit to soft touch to lower extremities Coordination: Cerebellar testing reveals good finger-nose-finger and heel-to-shin bilaterally.  Gait and station: Bilateral foot drop, gait is slightly wide based but steady, no assistive device Reflexes: Deep tendon reflexes are symmetric but are decreased   DIAGNOSTIC DATA (LABS, IMAGING, TESTING) - I reviewed patient records, labs, notes, testing and imaging myself where  available.  Lab Results  Component Value Date   WBC 16.5 (H) 03/17/2021   HGB 10.8 (L) 03/17/2021   HCT 32.5 (L) 03/17/2021   MCV 91 03/17/2021   PLT 543 (H)  03/17/2021      Component Value Date/Time   NA 140 06/08/2021 1203   K 5.2 06/08/2021 1203   CL 104 06/08/2021 1203   CO2 24 06/08/2021 1203   GLUCOSE 110 (H) 06/08/2021 1203   GLUCOSE 104 (H) 03/12/2021 0241   BUN 25 06/08/2021 1203   CREATININE 1.33 (H) 06/08/2021 1203   CALCIUM 9.5 06/08/2021 1203   PROT 6.9 03/17/2021 1023   ALBUMIN 4.1 03/17/2021 1023   AST 37 03/17/2021 1023   ALT 37 03/17/2021 1023   ALKPHOS 82 03/17/2021 1023   BILITOT 0.7 03/17/2021 1023   GFRNONAA 45 (L) 03/12/2021 0241   GFRAA 65 12/12/2019 0948   Lab Results  Component Value Date   CHOL 248 (H) 01/02/2023   HDL 42 01/02/2023   LDLCALC 175 (H) 01/02/2023   TRIG 168 (H) 01/02/2023   CHOLHDL 5.9 (H) 01/02/2023   Lab Results  Component Value Date   HGBA1C 6.2 (H) 03/01/2021   Lab Results  Component Value Date   VITAMINB12 432 01/14/2019   Lab Results  Component Value Date   TSH 1.500 10/15/2015   Otila Kluver, DNP  [facility] [address], [address] [place], [place] [address] [phone]

## 2023-02-21 ENCOUNTER — Ambulatory Visit: Payer: Medicare PPO | Admitting: Neurology

## 2023-02-21 DIAGNOSIS — G4733 Obstructive sleep apnea (adult) (pediatric): Secondary | ICD-10-CM | POA: Diagnosis not present

## 2023-02-22 NOTE — Progress Notes (Unsigned)
PATIENT: Edward [name] DOB: 10-21-1945  REASON FOR VISIT: Follow up ocular MG, peripheral neuropathy, bilateral foot drop HISTORY FROM: Patient, wife PRIMARY NEUROLOGIST: Dr. Marjory Lies   ASSESSMENT AND PLAN 77 y.o. year old male   1.  Myasthenia gravis 2.  Peripheral neuropathy  -MG has been stable since stopping prednisone around December 2023 -Continue Mestinon 60 mg up to 2-3 times a day  -Recommend continue to work on gait and leg strengthening exercises -Offered MRI Lumbar Spine for increasing left leg weakness over the last few years, wants to hold off, may see Dr. Dutch Quint  -Call for worsening symptoms, follow-up in 1 year or sooner if needed   HISTORY OF PRESENT ILLNESS: Today 02/23/23 Taking Mestinon lower dosing, was taking 60 mg TID, then reduced Mestinon down to 1/2 tablet TID, due to diarrhea. Went to see GI, doesn't think directly related to Mestinon has been on long term. Feels MG is doing okay, MG in the past resulted in ptosis to both eyes. Feels his left leg is getting weaker over time. Has seen Dr. Dutch Quint in the past for low back pain, not a lot of back pain at the time. Works in the yard very often. Bilateral foot drop.   07/30/22 SS: Saw Dr. Marjory Lies and last visit, prednisone reduced to 5 mg daily for 1 month then 5 mg every other day, then he stopped it.  He had called in December with some worsening MG symptoms, Mestinon was increased 60 mg 2-3 times a day. Currently taking 60 mg BID, 30 mg midday. MG is doing well. He has some diarrhea, but this is chronic. He has cataracts, no ptosis or diplopia. He has bilateral foot drop. He does stumble and fall. He has lighter shoes that's helps. No back pain, was seeing Dr. Dutch Quint in the past for back pain. He has some general weakness, not any worse since stopping prednisone. He works all the time in the yard, staying active.   UPDATE (01/24/22, VRP): Since last visit, doing well. Mild vision issues. Symptoms are stable. Sleep is  poor. Some memory issues.   Update 07/13/21 SS: Edward [name] here today for follow-up. He had reduced prednisone from 6 mg to 5 mg after last visit in September, did okay. A few weeks he increased it back up to 6 mg due to weakness, a lot of exertion with cardiac rehab. Has helped. Is in cardiac rehab post CABG, valve replacement back in November, had symptoms of heart burn. Not taking trazodone, felt groggy the next day, uses CPAP at night. MG symptoms doing well, have had minimal problems with ocular symptoms. No significant weakness of arms of legs. Doing well cardiac rehab, meeting his goals, this is 5th week. No falls, has cane just in case sometimes. No trouble swallowing or chewing. Does have some trouble with short term memory, remembering names, wife thinks poor hearing plays big role. With heat, he has worse MG symptoms.  HISTORY  01/06/2021 Dr. Anne Hahn: Mr. Brodeur is a 77 year old right-handed white male with a history of myasthenia gravis primarily with ocular features.  The patient has a significant peripheral neuropathy associated with bilateral foot drops and some gait instability.  Within the last 6 months he has reported some heartburn type symptoms but he also he has had some right shoulder and neck discomfort with pain down the right arm.  He has seen Dr. Dutch Quint from neurosurgery, MRI of the cervical spine was done recently, I do not have the report  of this.  The patient has had some medication alterations through cardiology.  He currently is on prednisone 6 mg daily and he takes Mestinon 60 mg, 1/2 tablet 3 times daily.  He is not sleeping well.  He reports some occasional dizziness when he stands up off and on.  The heat of the summer has worsened some of his myasthenic symptoms, he does have some generalized fatigue.  He reports occasional double vision and slight ptosis.  He reports no weakness of the arms or legs or difficulties with chewing or swallowing.  REVIEW OF SYSTEMS: Out of a complete  14 system review of symptoms, the patient complains only of the following symptoms, and all other reviewed systems are negative.  See HPI  ALLERGIES: No Known Allergies  HOME MEDICATIONS: Outpatient Medications Prior to Visit  Medication Sig Dispense Refill   allopurinol (ZYLOPRIM) 300 MG tablet Take 300 mg by mouth in the morning.     amLODipine (NORVASC) 5 MG tablet Take 1 tablet (5 mg total) by mouth daily. 90 tablet 3   amoxicillin (AMOXIL) 500 MG capsule Take 2 Gms ( 4 capsules ) 1 hour before dental work 4 capsule 3   Azelastine HCl 137 MCG/SPRAY SOLN Place into both nostrils.     Biotin 1000 MCG tablet Take 1,000 mcg by mouth in the morning.     carbamide peroxide (DEBROX) 6.5 % OTIC solution 5 drops 2 (two) times a week. Wax build up     ELIQUIS 5 MG TABS tablet Take 5 mg by mouth 2 (two) times daily.     Evolocumab (REPATHA SURECLICK) 140 MG/ML SOAJ Inject 140 mg into the skin every 14 (fourteen) days. 6 mL 3   fish oil-omega-3 fatty acids 1000 MG capsule Take 1 g by mouth in the morning and at bedtime.     losartan (COZAAR) 50 MG tablet Take 1 tablet (50 mg total) by mouth daily. (Patient taking differently: Take 50 mg by mouth every evening.) 90 tablet 3   pantoprazole (PROTONIX) 40 MG tablet Take 40 mg by mouth daily.     Polyethyl Glycol-Propyl Glycol (LUBRICANT EYE DROPS) 0.4-0.3 % SOLN Place 1-2 drops into both eyes 3 (three) times daily as needed (tired/dry/irritated eyes.).     pyridostigmine (MESTINON) 60 MG tablet Take 1 tablet (60 mg total) by mouth 3 (three) times daily. (Patient taking differently: Take 30 mg by mouth 3 (three) times daily.) 270 tablet 3   No facility-administered medications prior to visit.    PAST MEDICAL HISTORY: 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 by echo 07/2017   Chronic kidney disease    kidney stones   Colon polyps     Coronary artery disease 2022   Diplopia 10/15/2015   Diverticulosis    DVT (deep venous thrombosis) (HCC)    2018, 2019   Erectile dysfunction    GERD (gastroesophageal reflux disease)    with Barrett's esophagus   Gout    Hearing loss    hearing aides   Heart murmur    pt says he has had it since he was a child, no issues ever mentioned   HTN (hypertension)    Hypercholesteremia    Migraine headache    Myasthenia gravis (HCC)    Obesity    OSA (obstructive sleep apnea)    AHI 43/hr now on CPAP at 10cm H2O  Peripheral neuropathy 01/14/2019   feet   Right foot drop 12/03/2018    PAST SURGICAL HISTORY: Past Surgical History:  Procedure Laterality Date   AORTIC VALVE REPLACEMENT N/A 03/03/2021   Procedure: AORTIC VALVE REPLACEMENT (AVR) WITH BIOPROSTHETIC VALVE, Inpiris Resila Aortic Valve 25mm;  Surgeon: Corliss Skains, MD;  Location: [facility] OR;  Service: Open Heart Surgery;  Laterality: N/A;   CORONARY ARTERY BYPASS GRAFT N/A 03/03/2021   Procedure: CORONARY ARTERY BYPASS GRAFTING (CABG) x four on pump, using left internal mammary artery, left and right endoscopic greater saphenous veins conduits;  Surgeon: Corliss Skains, MD;  Location: [facility] OR;  Service: Open Heart Surgery;  Laterality: N/A;   ENDOVEIN HARVEST OF GREATER SAPHENOUS VEIN Bilateral 03/03/2021   Procedure: ENDOVEIN HARVEST OF GREATER SAPHENOUS VEIN;  Surgeon: Corliss Skains, MD;  Location: [facility] OR;  Service: Open Heart Surgery;  Laterality: Bilateral;   laser assisted uvuloplasty     MEDIASTINAL EXPLORATION N/A 03/03/2021   Procedure: MEDIASTINAL EXPLORATION;  Surgeon: Corliss Skains, MD;  Location: [facility] OR;  Service: Open Heart Surgery;  Laterality: N/A;   RIGHT/LEFT HEART CATH AND CORONARY ANGIOGRAPHY N/A 02/11/2021   Procedure: RIGHT/LEFT HEART CATH AND CORONARY ANGIOGRAPHY;  Surgeon: Swaziland, Peter M, MD;  Location: [facility] INVASIVE CV LAB;  Service: Cardiovascular;  Laterality: N/A;   TEE WITHOUT  CARDIOVERSION N/A 03/03/2021   Procedure: TRANSESOPHAGEAL ECHOCARDIOGRAM (TEE);  Surgeon: Corliss Skains, MD;  Location: [facility] OR;  Service: Open Heart Surgery;  Laterality: N/A;    FAMILY HISTORY: Family History  Problem Relation Age of Onset   Hypertension Mother    Heart attack Father    Heart disease Father    Hypertension Sister    Heart disease Brother    Hypertension Brother    Hypertension Brother    Anemia Brother    Cancer Brother     SOCIAL HISTORY: Social History   Socioeconomic History   Marital status: Married    Spouse name: [name]   Number of children: 2   Years of education: [employer] education level: Not on file  Occupational History   Occupation: Retired  Tobacco Use   Smoking status: Never   Smokeless tobacco: Never  Vaping Use   Vaping status: Never Used  Substance and Sexual Activity   Alcohol use: Not Currently   Drug use: No   Sexual activity: Not on file  Other Topics Concern   Not on file  Social History Narrative   Lives home w/ his wife   Right-handed    Drinks about 3 cups of caffeine per day   Social Determinants of Health   Financial Resource Strain: Not on file  Food Insecurity: Not on file  Transportation Needs: Not on file  Physical Activity: Not on file  Stress: Not on file  Social Connections: Not on file  Intimate Partner Violence: Not on file   PHYSICAL EXAM  Vitals:   02/23/23 1511 02/23/23 1513  BP: (!) 147/72 133/78  Pulse: 78    There is no height or weight on file to calculate BMI.  Generalized: Well developed, in no acute distress, hard of hearing Neurological examination  Mentation: Alert oriented to time, place, history taking. Follows all commands speech and language fluent Cranial nerve II-XII: Pupils were equal round reactive to light. Extraocular movements were full, visual field were full on confrontational test. Facial sensation and strength were normal. Head turning and shoulder shrug were  normal and symmetric.  Mild eye  open weakness.  No ptosis or diplopia. Motor: Good strength all extremities, mild left hip flexion weakness, there is bilateral foot drop Sensory: Length dependent sensory deficit to soft touch to lower extremities Coordination: Cerebellar testing reveals good finger-nose-finger and heel-to-shin bilaterally.  Gait and station: Bilateral foot drop, gait is slightly wide based but steady, no assistive device Reflexes: Deep tendon reflexes are symmetric but are decreased   DIAGNOSTIC DATA (LABS, IMAGING, TESTING) - I reviewed patient records, labs, notes, testing and imaging myself where available.  Lab Results  Component Value Date   WBC 16.5 (H) 03/17/2021   HGB 10.8 (L) 03/17/2021   HCT 32.5 (L) 03/17/2021   MCV 91 03/17/2021   PLT 543 (H) 03/17/2021      Component Value Date/Time   NA 140 06/08/2021 1203   K 5.2 06/08/2021 1203   CL 104 06/08/2021 1203   CO2 24 06/08/2021 1203   GLUCOSE 110 (H) 06/08/2021 1203   GLUCOSE 104 (H) 03/12/2021 0241   BUN 25 06/08/2021 1203   CREATININE 1.33 (H) 06/08/2021 1203   CALCIUM 9.5 06/08/2021 1203   PROT 6.9 03/17/2021 1023   ALBUMIN 4.1 03/17/2021 1023   AST 37 03/17/2021 1023   ALT 37 03/17/2021 1023   ALKPHOS 82 03/17/2021 1023   BILITOT 0.7 03/17/2021 1023   GFRNONAA 45 (L) 03/12/2021 0241   GFRAA 65 12/12/2019 0948   Lab Results  Component Value Date   CHOL 248 (H) 01/02/2023   HDL 42 01/02/2023   LDLCALC 175 (H) 01/02/2023   TRIG 168 (H) 01/02/2023   CHOLHDL 5.9 (H) 01/02/2023   Lab Results  Component Value Date   HGBA1C 6.2 (H) 03/01/2021   Lab Results  Component Value Date   VITAMINB12 432 01/14/2019   Lab Results  Component Value Date   TSH 1.500 10/15/2015   Otila Kluver, DNP  [facility] [address] [place], [place] [address] [phone]

## 2023-02-23 ENCOUNTER — Ambulatory Visit (INDEPENDENT_AMBULATORY_CARE_PROVIDER_SITE_OTHER): Payer: Medicare PPO | Admitting: Neurology

## 2023-02-23 ENCOUNTER — Encounter: Payer: Self-pay | Admitting: Neurology

## 2023-02-23 VITALS — BP 133/78 | HR 78

## 2023-02-23 DIAGNOSIS — G603 Idiopathic progressive neuropathy: Secondary | ICD-10-CM

## 2023-02-23 DIAGNOSIS — G7000 Myasthenia gravis without (acute) exacerbation: Secondary | ICD-10-CM | POA: Diagnosis not present

## 2023-02-23 NOTE — Patient Instructions (Signed)
Will continue current dose of Mestinon.  For the left leg weakness, can order MRI if you would like, otherwise follow-up with Dr. Dutch Quint Recommend exercise, strength training Follow-up in 1 year or sooner if needed, call for worsening symptoms

## 2023-03-14 DIAGNOSIS — R599 Enlarged lymph nodes, unspecified: Secondary | ICD-10-CM | POA: Diagnosis not present

## 2023-03-17 ENCOUNTER — Other Ambulatory Visit: Payer: Medicare PPO

## 2023-03-17 ENCOUNTER — Encounter: Payer: Self-pay | Admitting: Family Medicine

## 2023-03-17 ENCOUNTER — Emergency Department (HOSPITAL_BASED_OUTPATIENT_CLINIC_OR_DEPARTMENT_OTHER): Payer: Medicare PPO

## 2023-03-17 ENCOUNTER — Encounter (HOSPITAL_BASED_OUTPATIENT_CLINIC_OR_DEPARTMENT_OTHER): Payer: Self-pay | Admitting: Emergency Medicine

## 2023-03-17 ENCOUNTER — Other Ambulatory Visit: Payer: Self-pay | Admitting: Family Medicine

## 2023-03-17 ENCOUNTER — Emergency Department (HOSPITAL_BASED_OUTPATIENT_CLINIC_OR_DEPARTMENT_OTHER)
Admission: EM | Admit: 2023-03-17 | Discharge: 2023-03-18 | Disposition: A | Discharge status: Home / Self Care | Payer: Medicare PPO | Attending: Emergency Medicine | Admitting: Emergency Medicine

## 2023-03-17 ENCOUNTER — Other Ambulatory Visit: Payer: Self-pay

## 2023-03-17 DIAGNOSIS — R220 Localized swelling, mass and lump, head: Secondary | ICD-10-CM | POA: Insufficient documentation

## 2023-03-17 DIAGNOSIS — Z79899 Other long term (current) drug therapy: Secondary | ICD-10-CM | POA: Diagnosis not present

## 2023-03-17 DIAGNOSIS — R221 Localized swelling, mass and lump, neck: Secondary | ICD-10-CM | POA: Diagnosis not present

## 2023-03-17 DIAGNOSIS — Z471 Aftercare following joint replacement surgery: Secondary | ICD-10-CM | POA: Diagnosis not present

## 2023-03-17 DIAGNOSIS — K1120 Sialoadenitis, unspecified: Secondary | ICD-10-CM | POA: Diagnosis not present

## 2023-03-17 DIAGNOSIS — Z7901 Long term (current) use of anticoagulants: Secondary | ICD-10-CM | POA: Diagnosis not present

## 2023-03-17 DIAGNOSIS — L0201 Cutaneous abscess of face: Secondary | ICD-10-CM | POA: Insufficient documentation

## 2023-03-17 DIAGNOSIS — L040 Acute lymphadenitis of face, head and neck: Secondary | ICD-10-CM

## 2023-03-17 DIAGNOSIS — G7000 Myasthenia gravis without (acute) exacerbation: Secondary | ICD-10-CM | POA: Diagnosis not present

## 2023-03-17 LAB — URINALYSIS, W/ REFLEX TO CULTURE (INFECTION SUSPECTED)
Bacteria, UA: NONE SEEN
Bilirubin Urine: NEGATIVE
Glucose, UA: NEGATIVE mg/dL
Hgb urine dipstick: NEGATIVE
Leukocytes,Ua: NEGATIVE
Nitrite: NEGATIVE
Protein, ur: 30 mg/dL — AB
Specific Gravity, Urine: 1.032 — ABNORMAL HIGH (ref 1.005–1.030)
pH: 5.5 (ref 5.0–8.0)

## 2023-03-17 LAB — CBC WITH DIFFERENTIAL/PLATELET
Abs Immature Granulocytes: 0.04 10*3/uL (ref 0.00–0.07)
Basophils Absolute: 0.1 10*3/uL (ref 0.0–0.1)
Basophils Relative: 1 %
Eosinophils Absolute: 0.3 10*3/uL (ref 0.0–0.5)
Eosinophils Relative: 3 %
HCT: 44.3 % (ref 39.0–52.0)
Hemoglobin: 14.9 g/dL (ref 13.0–17.0)
Immature Granulocytes: 0 %
Lymphocytes Relative: 21 %
Lymphs Abs: 2.1 10*3/uL (ref 0.7–4.0)
MCH: 31 pg (ref 26.0–34.0)
MCHC: 33.6 g/dL (ref 30.0–36.0)
MCV: 92.3 fL (ref 80.0–100.0)
Monocytes Absolute: 1 10*3/uL (ref 0.1–1.0)
Monocytes Relative: 10 %
Neutro Abs: 6.6 10*3/uL (ref 1.7–7.7)
Neutrophils Relative %: 65 %
Platelets: 226 10*3/uL (ref 150–400)
RBC: 4.8 MIL/uL (ref 4.22–5.81)
RDW: 13.5 % (ref 11.5–15.5)
WBC: 10 10*3/uL (ref 4.0–10.5)
nRBC: 0 % (ref 0.0–0.2)

## 2023-03-17 LAB — COMPREHENSIVE METABOLIC PANEL
ALT: 16 U/L (ref 0–44)
AST: 26 U/L (ref 15–41)
Albumin: 4.2 g/dL (ref 3.5–5.0)
Alkaline Phosphatase: 60 U/L (ref 38–126)
Anion gap: 10 (ref 5–15)
BUN: 25 mg/dL — ABNORMAL HIGH (ref 8–23)
CO2: 27 mmol/L (ref 22–32)
Calcium: 10.2 mg/dL (ref 8.9–10.3)
Chloride: 102 mmol/L (ref 98–111)
Creatinine, Ser: 1.19 mg/dL (ref 0.61–1.24)
GFR, Estimated: >60 mL/min (ref >60)
Glucose, Bld: 99 mg/dL (ref 70–99)
Potassium: 4.3 mmol/L (ref 3.5–5.1)
Sodium: 139 mmol/L (ref 135–145)
Total Bilirubin: 0.7 mg/dL (ref <1.2)
Total Protein: 8.1 g/dL (ref 6.5–8.1)

## 2023-03-17 LAB — LACTIC ACID, PLASMA
Lactic Acid, Venous: 1.2 mmol/L (ref 0.5–1.9)
Lactic Acid, Venous: 1.5 mmol/L (ref 0.5–1.9)

## 2023-03-17 MED ORDER — ONDANSETRON HCL 4 MG/2ML IJ SOLN
4.0000 mg | Freq: Once | INTRAMUSCULAR | Status: AC
  Administered 2023-03-17: 4 mg via INTRAVENOUS
  Filled 2023-03-17: qty 2

## 2023-03-17 MED ORDER — IOHEXOL 300 MG/ML  SOLN
100.0000 mL | Freq: Once | INTRAMUSCULAR | Status: AC | PRN
  Administered 2023-03-17: 75 mL via INTRAVENOUS

## 2023-03-17 MED ORDER — MORPHINE SULFATE (PF) 4 MG/ML IV SOLN
4.0000 mg | Freq: Once | INTRAVENOUS | Status: AC
  Administered 2023-03-17: 4 mg via INTRAVENOUS
  Filled 2023-03-17: qty 1

## 2023-03-17 NOTE — ED Triage Notes (Signed)
Pt c/o RT side facial swelling. Tx at pcp x 4 days pta for same. Reports swelling behind RT ear. Denies fever

## 2023-03-17 NOTE — ED Provider Notes (Signed)
[place] EMERGENCY DEPARTMENT AT [facility] Provider Note   CSN: [id number] Arrival date & time: 03/17/23  1633     History  Chief Complaint  Patient presents with   Abscess    Edward HEYSER is a 77 y.o. male with a past medical history of myasthenia gravis and hearing loss who presents to the emergency department with chief complaint of right sided facial swelling.  Patient began having facial swelling near his right ear and below the angle of the mandible starting 4 days ago.  He complains of pain radiating into his ear so badly that he is unable to keep his hearing aid in on that side.  He does have a tooth that broke off recently.  He is unsure if this is related.  He has pain with chewing and movement of the jaw.  Is unable to lay on that side of his face.  He has had shaking chills today.  He saw his PCP who tried to order an outpatient CT image but was unable to do so.  The pain has been unrelenting and progressively worsening.  Patient has been taking amoxicillin with no improvement over the past 4 days.   Abscess      Home Medications Prior to Admission medications   Medication Sig Start Date End Date Taking? Authorizing Provider  allopurinol (ZYLOPRIM) 300 MG tablet Take 300 mg by mouth in the morning.    [provider]  amLODipine (NORVASC) 5 MG tablet Take 1 tablet (5 mg total) by mouth daily. 04/05/22 03/31/23  Little Ishikawa, MD  amoxicillin (AMOXIL) 500 MG capsule Take 2 Gms ( 4 capsules ) 1 hour before dental work 12/20/22   Little Ishikawa, MD  Azelastine HCl 137 MCG/SPRAY SOLN Place into both nostrils. 09/17/22   [provider]  Biotin 1000 MCG tablet Take 1,000 mcg by mouth in the morning.    [provider]  carbamide peroxide (DEBROX) 6.5 % OTIC solution 5 drops 2 (two) times a week. Wax build up    [provider]  ELIQUIS 5 MG TABS tablet Take 5 mg by mouth 2 (two) times daily. 10/20/22   [provider]  Evolocumab (REPATHA SURECLICK) 140 MG/ML SOAJ Inject 140 mg into the skin every 14 (fourteen) days. 01/04/23   Little Ishikawa, MD  fish oil-omega-3 fatty acids 1000 MG capsule Take 1 g by mouth in the morning and at bedtime.    [provider]  losartan (COZAAR) 50 MG tablet Take 1 tablet (50 mg total) by mouth daily. Patient taking differently: Take 50 mg by mouth every evening. 05/31/21   Little Ishikawa, MD  pantoprazole (PROTONIX) 40 MG tablet Take 40 mg by mouth daily. 02/09/22   [provider]  Polyethyl Glycol-Propyl Glycol (LUBRICANT EYE DROPS) 0.4-0.3 % SOLN Place 1-2 drops into both eyes 3 (three) times daily as needed (tired/dry/irritated eyes.).    [provider]  pyridostigmine (MESTINON) 60 MG tablet Take 1 tablet (60 mg total) by mouth 3 (three) times daily. Patient taking differently: Take 30 mg by mouth 3 (three) times daily. 08/02/22   Glean Salvo, NP      Allergies    Patient has no known allergies.    Review of Systems   Review of Systems  Physical Exam Updated Vital Signs BP (!) 167/95 (BP Location: Right Arm)   Pulse 88   Temp 98.6 F (37 C) (Oral)   Resp 18  Wt 78.5 kg   SpO2 98%   BMI 25.55 kg/m  Physical Exam Vitals and nursing note reviewed.  Constitutional:      General: He is not in acute distress.    Appearance: He is well-developed. He is not diaphoretic.  HENT:     Head: Normocephalic and atraumatic.     Comments: 1 broken upper second molar on the right side.  No obvious signs of infection. Parotid Gantt gland is swollen and tender on the right side.  No obvious swelling of the ear canal, no obvious purulence or bulging of the TMs bilaterally bilateral TMs within normal limits.  No pain with movement of the pinna.  Normal phonation, no obvious swelling of the anterior lymph nodes of the neck. Eyes:     General: No scleral icterus.    Conjunctiva/sclera: Conjunctivae normal.   Cardiovascular:     Rate and Rhythm: Normal rate and regular rhythm.     Heart sounds: Normal heart sounds.  Pulmonary:     Effort: Pulmonary effort is normal. No respiratory distress.     Breath sounds: Normal breath sounds.  Abdominal:     Palpations: Abdomen is soft.     Tenderness: There is no abdominal tenderness.  Musculoskeletal:     Cervical back: Normal range of motion and neck supple.  Skin:    General: Skin is warm and dry.  Neurological:     Mental Status: He is alert.  Psychiatric:        Behavior: Behavior normal.     ED Results / Procedures / Treatments   Labs (all labs ordered are listed, but only abnormal results are displayed) Labs Reviewed  COMPREHENSIVE METABOLIC PANEL - Abnormal; Notable for the following components:      Result Value   BUN 25 (*)    All other components within normal limits  URINALYSIS, W/ REFLEX TO CULTURE (INFECTION SUSPECTED) - Abnormal; Notable for the following components:   Specific Gravity, Urine 1.032 (*)    Ketones, ur TRACE (*)    Protein, ur 30 (*)    All other components within normal limits  LACTIC ACID, PLASMA  CBC WITH DIFFERENTIAL/PLATELET  LACTIC ACID, PLASMA    EKG None  Radiology No results found.  Procedures Procedures    Medications Ordered in ED Medications  morphine (PF) 4 MG/ML injection 4 mg (has no administration in time range)  ondansetron (ZOFRAN) injection 4 mg (has no administration in time range)    ED Course/ Medical Decision Making/ A&P Clinical Course as of 03/17/23 2358  Fri Mar 17, 2023  2322 WBC: 10.0 [AH]    Clinical Course User Index [AH] Arthor Captain, PA-C                                 Medical Decision Making 77 year old male with swelling of the right side of the face, differential diagnosis includes dental infection, parotitis, sialoadenitis, mass, malignant otitis externa. Clinical appearance most consistent with parotitis. I ordered labs which shows no elevated  white blood cell count, CMP with mildly elevated BUN of insignificant value.  Lactic acid within normal limits x 2, urine with without evidence of infection.  Patient CT soft tissue of the neck is currently pending.  Signout given to Dr. Judd Lien at shift change who will follow-up on these images.  Patient may need to broaden his antibiotics.  Amount and/or Complexity of Data Reviewed Labs: ordered. Decision-making  details documented in ED Course. Radiology: ordered.  Risk Prescription drug management.           Final Clinical Impression(s) / ED Diagnoses Final diagnoses:  None    Rx / DC Orders ED Discharge Orders     None         Arthor Captain, PA-C 03/17/23 2359    Geoffery Lyons, MD 03/18/23 [phone]

## 2023-03-18 MED ORDER — HYDROCODONE-ACETAMINOPHEN 5-325 MG PO TABS: 2.0000 | ORAL_TABLET | ORAL | 0 refills | Status: DC | PRN

## 2023-03-18 MED ORDER — AMOXICILLIN-POT CLAVULANATE 500-125 MG PO TABS: 1.0000 | ORAL_TABLET | Freq: Three times a day (TID) | ORAL | 0 refills | Status: DC

## 2023-03-18 NOTE — Discharge Instructions (Addendum)
Begin taking Augmentin as prescribed.  Take ibuprofen 600 mg every 6 hours as needed for pain.  Begin taking hydrocodone as prescribed as needed for pain not relieved with ibuprofen.  Follow-up with primary doctor if not improving in the next few days.

## 2023-03-27 DIAGNOSIS — Z86718 Personal history of other venous thrombosis and embolism: Secondary | ICD-10-CM | POA: Diagnosis not present

## 2023-03-27 DIAGNOSIS — K1120 Sialoadenitis, unspecified: Secondary | ICD-10-CM | POA: Diagnosis not present

## 2023-03-30 ENCOUNTER — Telehealth: Payer: Self-pay | Admitting: Cardiology

## 2023-03-30 ENCOUNTER — Encounter: Payer: Self-pay | Admitting: Pharmacist

## 2023-03-30 DIAGNOSIS — E785 Hyperlipidemia, unspecified: Secondary | ICD-10-CM

## 2023-03-30 NOTE — Telephone Encounter (Signed)
  Per MyChart Scheduling message:  I was told to come and get a cholesterol test after taking my shots of Rapatha for two months. I would like to come in next week, preferably in the morning for a cholesterol test . Please issue an authorization for this lab work . Last time I came in there was a delay because there was no paperwork for the blood work. Thank you

## 2023-03-30 NOTE — Telephone Encounter (Signed)
I have entered labs, released them and sent mychart message to pt.

## 2023-03-31 LAB — LIPID PANEL
Chol/HDL Ratio: 3.1 {ratio} (ref 0.0–5.0)
Cholesterol, Total: 129 mg/dL (ref 100–199)
HDL: 42 mg/dL (ref >39)
LDL Chol Calc (NIH): 65 mg/dL (ref 0–99)
Triglycerides: 123 mg/dL (ref 0–149)
VLDL Cholesterol Cal: 22 mg/dL (ref 5–40)

## 2023-05-22 DIAGNOSIS — G4733 Obstructive sleep apnea (adult) (pediatric): Secondary | ICD-10-CM | POA: Diagnosis not present

## 2023-05-24 ENCOUNTER — Ambulatory Visit: Payer: Medicare PPO | Attending: Cardiology | Admitting: Cardiology

## 2023-05-24 VITALS — BP 126/70 | HR 72 | Ht 69.0 in | Wt 178.0 lb

## 2023-05-24 DIAGNOSIS — I10 Essential (primary) hypertension: Secondary | ICD-10-CM

## 2023-05-24 DIAGNOSIS — E785 Hyperlipidemia, unspecified: Secondary | ICD-10-CM | POA: Diagnosis not present

## 2023-05-24 DIAGNOSIS — I2510 Atherosclerotic heart disease of native coronary artery without angina pectoris: Secondary | ICD-10-CM | POA: Diagnosis not present

## 2023-05-24 DIAGNOSIS — Z01818 Encounter for other preprocedural examination: Secondary | ICD-10-CM

## 2023-05-24 DIAGNOSIS — Z952 Presence of prosthetic heart valve: Secondary | ICD-10-CM | POA: Diagnosis not present

## 2023-05-24 NOTE — Patient Instructions (Signed)
Medication Instructions:  Please hold Eliquis day before your procedure ( Colonoscopy)  *If you need a refill on your cardiac medications before your next appointment, please call your pharmacy*   Lab Work: none If you have labs (blood work) drawn today and your tests are completely normal, you will receive your results only by: MyChart Message (if you have MyChart) OR A paper copy in the mail If you have any lab test that is abnormal or we need to change your treatment, we will call you to review the results.   Testing/Procedures: none   Follow-Up: At [facility], you and your health needs are our priority.  As part of our continuing mission to provide you with exceptional heart care, we have created designated Provider Care Teams.  These Care Teams include your primary Cardiologist (physician) and Advanced Practice Providers (APPs -  Physician Assistants and Nurse Practitioners) who all work together to provide you with the care you need, when you need it.  We recommend signing up for the patient portal called "MyChart".  Sign up information is provided on this After Visit Summary.  MyChart is used to connect with patients for Virtual Visits (Telemedicine).  Patients are able to view lab/test results, encounter notes, upcoming appointments, etc.  Non-urgent messages can be sent to your provider as well.   To learn more about what you can do with MyChart, go to [email or web address].    Your next appointment:   6 month(s)  Provider:   Little Ishikawa, MD     Other Instructions none

## 2023-05-24 NOTE — Progress Notes (Signed)
Cardiology Office Note:    Date:  05/24/2023   ID:  Edward [name], DOB 1945/11/01, MRN [id number]  PCP:  Farris Has, MD  Cardiologist:  Little Ishikawa, MD  Electrophysiologist:  Lanier Prude, MD   Referring MD: Farris Has, MD   Chief Complaint  Patient presents with   Coronary Artery Disease     History of Present Illness:    Edward [name] is a 78 y.o. male with a hx of CAD s/p CABG, aortic stenosis, hypertension, hyperlipidemia, myasthenia gravis, OSA, DVT who presents for follow-up.  He was referred by Dr. Kateri Plummer for evaluation of dyspnea and chest pain, initially seen on 09/29/2020.  Echocardiogram 07/27/2017 showed LVEF 60 to 65%, grade 1 diastolic dysfunction, functionally bicuspid aortic valve with mild to moderate aortic stenosis and mild aortic regurgitation, mild MR, mild TR. Echocardiogram on 10/14/2020 showed normal biventricular function, mild LVH, mild to moderate aortic stenosis, mild aortic regurgitation, mild mitral regurgitation.  Lexiscan Myoview on 11/05/2020 showed fixed inferior defect likely representing artifact, no ischemia, EF 52%.  Calcium score in 11/30/2020 was 674 (72nd percentile).  Continued to have chest pain and underwent cath on 02/11/2021 which showed severe multivessel CAD, moderate AS, normal filling pressures.  Underwent CABG x4 (LIMA-LAD, SVG-PDA, SVG-OM, SVG-D2 Y graft off OM), aortic valve replacement with 25 mm Inspiris Resilia on 03/03/2021.  Postop course was complicated by A. fib which was managed with amiodarone and beta-blocker.  Also developed thrombocytopenia down to 62 but recovery without intervention.  Echocardiogram on 04/30/21 showed EF 50 to 55%, mildly reduced RV function, normal functioning bioprosthetic aortic valve.  Zio patch x3 days on 08/24/2021 showed rare PVCs (less than 1% of beats).  Since last clinic visit,  reports he is doing well.  He has lost 50lbs.  Reports recent GI issues, has been seeing gastroenterology and  planning colonoscopy.  He denies any chest pain or dyspnea.  Can walk up flight of stairs without symptoms.  He has been exercising on recumbent bicycle for 30 minutes each day.  Wt Readings from Last 3 Encounters:  05/24/23 178 lb (80.7 kg)  03/17/23 173 lb (78.5 kg)  11/22/22 190 lb 12.8 oz (86.5 kg)      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 by echo 07/2017   Chronic kidney disease    kidney stones   Colon polyps    Coronary artery disease 2022   Diplopia 10/15/2015   Diverticulosis    DVT (deep venous thrombosis) (HCC)    2018, 2019   Erectile dysfunction    GERD (gastroesophageal reflux disease)    with Barrett's esophagus   Gout    Hearing loss    hearing aides   Heart murmur    pt says he has had it since he was a child, no issues ever mentioned   HTN (hypertension)    Hypercholesteremia    Migraine headache    Myasthenia gravis (HCC)    Obesity    OSA (obstructive sleep apnea)    AHI 43/hr now on CPAP at 10cm H2O   Peripheral neuropathy 01/14/2019   feet   Right foot drop 12/03/2018    Past Surgical History:  Procedure Laterality Date   AORTIC VALVE REPLACEMENT N/A 03/03/2021   Procedure: AORTIC VALVE REPLACEMENT (AVR) WITH BIOPROSTHETIC VALVE, Inpiris Resila Aortic Valve 25mm;  Surgeon: Cliffton Asters,  Eliezer Lofts, MD;  Location: [facility] OR;  Service: Open Heart Surgery;  Laterality: N/A;   CORONARY ARTERY BYPASS GRAFT N/A 03/03/2021   Procedure: CORONARY ARTERY BYPASS GRAFTING (CABG) x four on pump, using left internal mammary artery, left and right endoscopic greater saphenous veins conduits;  Surgeon: Corliss Skains, MD;  Location: [facility] OR;  Service: Open Heart Surgery;  Laterality: N/A;   ENDOVEIN HARVEST OF GREATER SAPHENOUS VEIN Bilateral 03/03/2021   Procedure: ENDOVEIN HARVEST OF GREATER SAPHENOUS VEIN;  Surgeon: Corliss Skains, MD;   Location: [facility] OR;  Service: Open Heart Surgery;  Laterality: Bilateral;   laser assisted uvuloplasty     MEDIASTINAL EXPLORATION N/A 03/03/2021   Procedure: MEDIASTINAL EXPLORATION;  Surgeon: Corliss Skains, MD;  Location: [facility] OR;  Service: Open Heart Surgery;  Laterality: N/A;   RIGHT/LEFT HEART CATH AND CORONARY ANGIOGRAPHY N/A 02/11/2021   Procedure: RIGHT/LEFT HEART CATH AND CORONARY ANGIOGRAPHY;  Surgeon: [place], Peter M, MD;  Location: [facility] INVASIVE CV LAB;  Service: Cardiovascular;  Laterality: N/A;   TEE WITHOUT CARDIOVERSION N/A 03/03/2021   Procedure: TRANSESOPHAGEAL ECHOCARDIOGRAM (TEE);  Surgeon: Corliss Skains, MD;  Location: [facility] OR;  Service: Open Heart Surgery;  Laterality: N/A;    Current Medications: Current Meds  Medication Sig   allopurinol (ZYLOPRIM) 300 MG tablet Take 300 mg by mouth in the morning.   amoxicillin (AMOXIL) 500 MG capsule Take 2 Gms ( 4 capsules ) 1 hour before dental work   amoxicillin-clavulanate (AUGMENTIN) 500-125 MG tablet Take 1 tablet by mouth every 8 (eight) hours.   Azelastine HCl 137 MCG/SPRAY SOLN Place into both nostrils.   Biotin 1000 MCG tablet Take 1,000 mcg by mouth in the morning.   carbamide peroxide (DEBROX) 6.5 % OTIC solution 5 drops 2 (two) times a week. Wax build up   ELIQUIS 5 MG TABS tablet Take 5 mg by mouth 2 (two) times daily.   Evolocumab (REPATHA SURECLICK) 140 MG/ML SOAJ Inject 140 mg into the skin every 14 (fourteen) days.   fish oil-omega-3 fatty acids 1000 MG capsule Take 1 g by mouth in the morning and at bedtime.   HYDROcodone-acetaminophen (NORCO) 5-325 MG tablet Take 2 tablets by mouth every 4 (four) hours as needed.   losartan (COZAAR) 50 MG tablet Take 1 tablet (50 mg total) by mouth daily. (Patient taking differently: Take 50 mg by mouth every evening.)   pantoprazole (PROTONIX) 40 MG tablet Take 40 mg by mouth daily.   Polyethyl Glycol-Propyl Glycol (LUBRICANT EYE DROPS) 0.4-0.3 % SOLN Place 1-2 drops into  both eyes 3 (three) times daily as needed (tired/dry/irritated eyes.).   pyridostigmine (MESTINON) 60 MG tablet Take 1 tablet (60 mg total) by mouth 3 (three) times daily. (Patient taking differently: Take 30 mg by mouth 3 (three) times daily.)     Allergies:   Patient has no known allergies.   Social History   Socioeconomic History   Marital status: Married    Spouse name: [name]   Number of children: 2   Years of education: [employer] education level: Not on file  Occupational History   Occupation: Retired  Tobacco Use   Smoking status: Never   Smokeless tobacco: Never  Vaping Use   Vaping status: Never Used  Substance and Sexual Activity   Alcohol use: Not Currently   Drug use: No   Sexual activity: Not on file  Other Topics Concern   Not on file  Social History Narrative   Lives home  w/ his wife   Right-handed    Drinks about 3 cups of caffeine per day   Social Drivers of Corporate investment banker Strain: Not on file  Food Insecurity: Not on file  Transportation Needs: Not on file  Physical Activity: Not on file  Stress: Not on file  Social Connections: Not on file     Family History: The patient's family history includes Anemia in his brother; Cancer in his brother; Heart attack in his father; Heart disease in his brother and father; Hypertension in his brother, brother, mother, and sister.  ROS:   Please see the history of present illness.    All other systems reviewed and are negative.  EKGs/Labs/Other Studies Reviewed:    The following studies were reviewed today:  [place] LE Venous 08/01/2017: Final Interpretation:  Right: There is evidence of acute DVT in the Posterior Tibial veins.  Ultrasound is unable to distinguish whether obstruction in the Femoral  vein, and Popliteal vein is acute or chronic.   TTE 07/27/2017: - Left ventricle: The cavity size was normal. There was mild    concentric hypertrophy. Systolic function was normal. The     estimated ejection fraction was in the range of 60% to 65%. Wall    motion was normal; there were no regional wall motion    abnormalities. Doppler parameters are consistent with abnormal    left ventricular relaxation (grade 1 diastolic dysfunction).  - Aortic valve: Functionally bicuspid; moderately thickened,    moderately calcified leaflets. There was mild to moderate    stenosis. There was mild regurgitation. Peak gradient (S): 25 mm    Hg. Valve area (VTI): 1.24 cm^2. Valve area (Vmax): 1.42 cm^2.    Valve area (Vmean): 1.37 cm^2.  - Mitral valve: There was mild regurgitation.  - Left atrium: The atrium was at the upper limits of normal in    size.  - Right ventricle: The cavity size was normal. Wall thickness was    normal. Systolic function was normal.  - Tricuspid valve: There was mild regurgitation.  - Pulmonary arteries: Systolic pressure was within the normal    range.   US Venous LE Doppler 06/27/2016: 1. Positive for acute to subacute deep venous thrombosis extending from the popliteal vein into the common femoral vein. The common femoral and femoral venous thrombus is nonocclusive while the popliteal venous thrombus is occlusive.  Echo 08/31/2015: - Left ventricle: The cavity size was normal. Wall thickness was    normal. Systolic function was vigorous. The estimated ejection    fraction was in the range of 65% to 70%. Wall motion was normal;    there were no regional wall motion abnormalities. Doppler    parameters are consistent with abnormal left ventricular    relaxation (grade 1 diastolic dysfunction).  - Aortic valve: There was mild regurgitation. Mean gradient (S): 9    mm Hg. Peak gradient (S): 15 mm Hg. Valve area (VTI): 1.98 cm^2.    Valve area (Vmax): 2.07 cm^2. Valve area (Vmean): 1.87 cm^2.  - Pulmonary arteries: Systolic pressure was mildly increased. PA    peak pressure: 32 mm Hg (S).   EKG:  05/24/2023: Normal sinus rhythm, rate 72, nonspecific T wave  flattening 11/22/2022: Sinus bradycardia, rate 51, nonspecific T wave flattening 03/30/2022: Normal sinus rhythm, rate 60, nonspecific T wave flattening, no PVCs 03/17/21: Sinus bradycardia, rate 52, less than 1 mm ST depressions in leads V3-6, nonspecific T wave flattening 12/08/20: Sinus rhythm, PVCs, rate 71,  Q waves in lead III 11/09/2020: no ekg ordered today.  09/29/2020: NSR, PACs, PVCs, nonspecific T wave flattening, rate 84 bpm   Recent Labs: 03/17/2023: ALT 16; BUN 25; Creatinine, Ser 1.19; Hemoglobin 14.9; Platelets 226; Potassium 4.3; Sodium 139  Recent Lipid Panel    Component Value Date/Time   CHOL 129 03/30/2023 1659   TRIG 123 03/30/2023 1659   HDL 42 03/30/2023 1659   CHOLHDL 3.1 03/30/2023 1659   LDLCALC 65 03/30/2023 1659    Physical Exam:    VS:  BP 126/70   Pulse 72   Ht 5\' 9"  (1.753 m)   Wt 178 lb (80.7 kg)   SpO2 96%   BMI 26.29 kg/m     Wt Readings from Last 3 Encounters:  05/24/23 178 lb (80.7 kg)  03/17/23 173 lb (78.5 kg)  11/22/22 190 lb 12.8 oz (86.5 kg)     GEN: Well nourished, well developed in no acute distress HEENT: Normal NECK: No JVD; No carotid bruits CARDIAC: RRR, no murmur RESPIRATORY:  Clear to auscultation without rales, wheezing or rhonchi  ABDOMEN: Soft, non-tender, non-distended MUSCULOSKELETAL:  Trace RLE edema.  Incisions c/d/i SKIN: Warm and dry NEUROLOGIC:  Alert and oriented x 3 PSYCHIATRIC:  Normal affect   ASSESSMENT:    1. Pre-op evaluation   2. Coronary artery disease involving native coronary artery of native heart without angina pectoris   3. Essential hypertension   4. S/P AVR   5. Hyperlipidemia, unspecified hyperlipidemia type      PLAN:    Preop evaluation: Prior to colonoscopy.  Low risk procedure.  He has been active, denies any exertional symptoms.  No further workup recommended prior to surgery.  Can hold Eliquis 24 hours prior to procedure  CAD: underwent cath on 02/11/2021 which showed severe  multivessel CAD, moderate AS, normal filling pressures.  Underwent CABG x4 (LIMA-LAD, SVG-PDA, SVG-OM, SVG-D2 Y graft off OM) on 03/03/21 -Continue Eliquis -Continue Repatha -Stopped metoprolol due to his myasthenia  Aortic stenosis s/p AVR: Mild to moderate on echo 07/2017.  Echocardiogram on 10/14/2020 showed normal biventricular function, mild LVH, mild to moderate aortic stenosis, mild aortic regurgitation, mild mitral regurgitation.  Cath 02/11/2021 suggested moderate aortic stenosis.  Underwent aortic valve replacement with 25 mm Inspiris Resilia on 03/03/2021 at time of CABG.  Echocardiogram on 04/30/21 showed EF 50 to 55%, mildly reduced RV function, normal functioning bioprosthetic aortic valve.  Hyperlipidemia: On received in 10 mg daily, LDL 68 on 09/29/22.  Did not tolerate atorvastatin or rosuvastatin due to myalgias.  LDL 175 on 01/02/2023.  Started Repatha, LDL 65 on 03/30/2023  Hypertension: On amlodipine 5 mg daily and losartan 50 mg daily prior to CABG. Switched to metoprolol 25 mg twice daily following CABG but was discontinued due to worsening myasthenia symptoms.  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  Zio patch x3 days 12/16/2020 showed 95 episodes of NSVT with longest lasting 12 beats and frequent PVCs (14% of beats).  Suspect related to ischemia in setting of severe multivessel disease.  Zio patch x3 days on 08/24/2021 showed rare PVCs (less than 1% of beats).  Paroxysmal atrial fibrillation: Occurred in postoperative setting after CABG.  Loaded with amiodarone, has been discontinued.  Continued on Eliquis.  Currently in sinus rhythm.  OSA: on CPAP, reports compliance  RTC in 6 months   Medication Adjustments/Labs  and Tests Ordered: Current medicines are reviewed at length with the patient today.  Concerns regarding medicines are outlined above.  Orders  Placed This Encounter  Procedures   EKG 12-Lead     No orders of the defined types were placed in this encounter.     Patient Instructions  Medication Instructions:  Please hold Eliquis day before your procedure ( Colonoscopy)  *If you need a refill on your cardiac medications before your next appointment, please call your pharmacy*   Lab Work: none If you have labs (blood work) drawn today and your tests are completely normal, you will receive your results only by: MyChart Message (if you have MyChart) OR A paper copy in the mail If you have any lab test that is abnormal or we need to change your treatment, we will call you to review the results.   Testing/Procedures: none   Follow-Up: At [facility], you and your health needs are our priority.  As part of our continuing mission to provide you with exceptional heart care, we have created designated Provider Care Teams.  These Care Teams include your primary Cardiologist (physician) and Advanced Practice Providers (APPs -  Physician Assistants and Nurse Practitioners) who all work together to provide you with the care you need, when you need it.  We recommend signing up for the patient portal called "MyChart".  Sign up information is provided on this After Visit Summary.  MyChart is used to connect with patients for Virtual Visits (Telemedicine).  Patients are able to view lab/test results, encounter notes, upcoming appointments, etc.  Non-urgent messages can be sent to your provider as well.   To learn more about what you can do with MyChart, go to [email or web address].    Your next appointment:   6 month(s)  Provider:   Little Ishikawa, MD     Other Instructions none            Signed, Little Ishikawa, MD  05/24/2023 10:42 AM    [facility]

## 2023-06-08 DIAGNOSIS — G4733 Obstructive sleep apnea (adult) (pediatric): Secondary | ICD-10-CM | POA: Diagnosis not present

## 2023-06-08 DIAGNOSIS — R413 Other amnesia: Secondary | ICD-10-CM | POA: Diagnosis not present

## 2023-06-08 DIAGNOSIS — J309 Allergic rhinitis, unspecified: Secondary | ICD-10-CM | POA: Diagnosis not present

## 2023-06-08 DIAGNOSIS — Z0000 Encounter for general adult medical examination without abnormal findings: Secondary | ICD-10-CM | POA: Diagnosis not present

## 2023-06-08 DIAGNOSIS — G7000 Myasthenia gravis without (acute) exacerbation: Secondary | ICD-10-CM | POA: Diagnosis not present

## 2023-06-08 DIAGNOSIS — I825Z9 Chronic embolism and thrombosis of unspecified deep veins of unspecified distal lower extremity: Secondary | ICD-10-CM | POA: Diagnosis not present

## 2023-06-08 DIAGNOSIS — E782 Mixed hyperlipidemia: Secondary | ICD-10-CM | POA: Diagnosis not present

## 2023-06-08 DIAGNOSIS — I10 Essential (primary) hypertension: Secondary | ICD-10-CM | POA: Diagnosis not present

## 2023-06-08 DIAGNOSIS — R7303 Prediabetes: Secondary | ICD-10-CM | POA: Diagnosis not present

## 2023-06-15 DIAGNOSIS — H04123 Dry eye syndrome of bilateral lacrimal glands: Secondary | ICD-10-CM | POA: Diagnosis not present

## 2023-06-15 DIAGNOSIS — Z961 Presence of intraocular lens: Secondary | ICD-10-CM | POA: Diagnosis not present

## 2023-06-27 DIAGNOSIS — M65332 Trigger finger, left middle finger: Secondary | ICD-10-CM | POA: Diagnosis not present

## 2023-06-27 DIAGNOSIS — M65322 Trigger finger, left index finger: Secondary | ICD-10-CM | POA: Diagnosis not present

## 2023-06-27 DIAGNOSIS — M65321 Trigger finger, right index finger: Secondary | ICD-10-CM | POA: Diagnosis not present

## 2023-07-20 DIAGNOSIS — M4310 Spondylolisthesis, site unspecified: Secondary | ICD-10-CM | POA: Diagnosis not present

## 2023-08-15 DIAGNOSIS — M65342 Trigger finger, left ring finger: Secondary | ICD-10-CM | POA: Diagnosis not present

## 2023-08-15 DIAGNOSIS — M65352 Trigger finger, left little finger: Secondary | ICD-10-CM | POA: Diagnosis not present

## 2023-08-15 DIAGNOSIS — M65341 Trigger finger, right ring finger: Secondary | ICD-10-CM | POA: Diagnosis not present

## 2023-08-21 DIAGNOSIS — G4733 Obstructive sleep apnea (adult) (pediatric): Secondary | ICD-10-CM | POA: Diagnosis not present

## 2023-10-04 ENCOUNTER — Other Ambulatory Visit: Payer: Self-pay | Admitting: Neurology

## 2023-11-08 ENCOUNTER — Telehealth: Payer: Self-pay | Admitting: Cardiology

## 2023-11-08 DIAGNOSIS — Z86718 Personal history of other venous thrombosis and embolism: Secondary | ICD-10-CM | POA: Diagnosis not present

## 2023-11-08 DIAGNOSIS — E663 Overweight: Secondary | ICD-10-CM | POA: Diagnosis not present

## 2023-11-08 DIAGNOSIS — R42 Dizziness and giddiness: Secondary | ICD-10-CM | POA: Diagnosis not present

## 2023-11-08 DIAGNOSIS — I10 Essential (primary) hypertension: Secondary | ICD-10-CM | POA: Diagnosis not present

## 2023-11-08 DIAGNOSIS — H9193 Unspecified hearing loss, bilateral: Secondary | ICD-10-CM | POA: Diagnosis not present

## 2023-11-08 DIAGNOSIS — I480 Paroxysmal atrial fibrillation: Secondary | ICD-10-CM | POA: Diagnosis not present

## 2023-11-08 DIAGNOSIS — I2510 Atherosclerotic heart disease of native coronary artery without angina pectoris: Secondary | ICD-10-CM | POA: Diagnosis not present

## 2023-11-08 DIAGNOSIS — G4733 Obstructive sleep apnea (adult) (pediatric): Secondary | ICD-10-CM | POA: Diagnosis not present

## 2023-11-08 NOTE — Telephone Encounter (Signed)
 STAT if patient feels like he/she is going to faint   1. Are you feeling dizzy, lightheaded, or faint right now? No, comes and goes but worsening    2. Have you passed out?  No (If yes move to [facility])   3. Do you have any other symptoms? No   4. Have you checked your HR and BP (record if available)? Today-112/60's

## 2023-11-08 NOTE — Telephone Encounter (Signed)
 Linda (dpr) reports the patient gets dizzy spells more frequently now. Had it this morning at an office visit while he was standing there signing in- almost passed out- and BP was 112/60's.  Has not been keeping BP log for him. Instructed her to keep a daily BP and heart rate log. She does report that anytime she has checked it, his BP is normal- it has not been low.    Made an appt with DOD 11/13/23- will send this information to Dr Kate and be back in touch with any updates/recommendations. She verbalized understanding. Given ER precautions

## 2023-11-08 NOTE — Telephone Encounter (Signed)
 Yes we can keep DOD appointment

## 2023-11-11 NOTE — Progress Notes (Unsigned)
 Cardiology Office Note:   Date:  11/13/2023  ID:  Edward [name], DOB [birth year], MRN [id number] PCP:  Kip Righter, MD  [facility] Providers Cardiologist:  Kate DOD cardiologist:  Wendel Haws, MD Referring MD: Kip Righter, MD  Chief Complaint/Reason for Referral: DOD appointment for dizziness  ASSESSMENT:    1. Postural dizziness with presyncope   2. S/P CABG x 4   3. S/P AVR   4. Hyperlipidemia LDL goal <70   5. Aortic atherosclerosis (HCC)   6. Essential hypertension   7. Recurrent deep vein thrombosis (DVT) (HCC)   8. Secondary hypercoagulable state (HCC)   9. Myasthenia gravis (HCC)   10. BMI 26.0-26.9,adult     PLAN:   In order of problems listed above: Dizziness: Orthostatic blood pressures consistent with orthostatic hypotension with decrease in systolic blood pressure from 126/78 in the supine position to 103/52 upon standing.  Heart rate increased from 58 to 66.  I suspect he has developed increased insensible losses from being outside.  His orthostatic blood pressures and the fact that his symptoms only happen when he goes from sitting to standing would seem to be consistent with this suspicion.  I will check a CBC, CMP, and reflex TSH.  I will stop the patient's losartan  but we will continue his amlodipine  5 mg.  I have asked him to increase his water intake and to go from sitting to standing quickly.  I will have him follow-up with our office in the next week or so.  If his symptoms do not improve we will consider a monitor. Status post CABG: Continue Eliquis 5 mg twice daily, Repatha  140 mg every 2 weeks. Status post AVR: He has no significant murmur and his aortic valve replacement was performed 3 years ago.  His last echocardiogram was reassuring.  Given his orthostatic symptoms we will defer echocardiogram at this time.  Certainly if the measures proposed above do not improve his symptoms then we can consider an echocardiogram. Hyperlipidemia: Continue Repatha   140 mg every 2 weeks; LDL within the last year was 65. Aortic Atherosclerosis: Continue Eliquis 5 Mg BID and Repatha  140 mg every 2 weeks.   Hypertension: Continue amlodipine  5 mg daily and stop losartan  as detailed above; patient will check his blood pressures and let us  know what they are. Recurrent DVT: Continue Eliquis 5 mg twice daily Secondary hypercoagulable state: Continue Eliquis 5 mg twice daily Myasthenia gravis: Continue pyridostigmine  60 mg 3 times daily Elevated BMI: Continue diet and exercise modification.            Dispo:  No follow-ups on file.      Medication Adjustments/Labs and Tests Ordered: Current medicines are reviewed at length with the patient today.  Concerns regarding medicines are outlined above.  The following changes have been made:     Labs/tests ordered: Orders Placed This Encounter  Procedures   EKG 12-Lead    Medication Changes: No orders of the defined types were placed in this encounter.   Current medicines are reviewed at length with the patient today.  The patient does not have concerns regarding medicines.  I spent 38 minutes reviewing all clinical data during and prior to this visit including all relevant imaging studies, laboratories, clinical information from other health systems and prior notes from both Cardiology and other specialties, interviewing the patient, conducting a complete physical examination, and coordinating care in order to formulate a comprehensive and personalized evaluation and treatment plan.   History of Present Illness:  FOCUSED PROBLEM LIST:   CAD CABG in LIMA to LAD, vein graft to PDA, Y graft to OM and diagonal 2022 Aortic stenosis Status post AVR 25 mm Inspiris 2022 G1 DD, aortic valve MG 7, V-max 1.9, DI 0.65, EF 50 to 55% TTE 2023 Hypertension Hyperlipidemia Aortic atherosclerosis CT 2022 Recurrent DVT On Eliquis Myasthenia gravis On pyridostigmine  BMI 18 November 2023:  Patient consents to  use of AI scribe. The patient is a 78 year old male with the above listed medical problems seen for an expedited DOD appointment due to dizziness.  He contacted the office recently with complaints of dizzy spells occurring more frequently.  Apparently upon standing once he almost passed out.  He experiences dizziness and lightheadedness primarily when transitioning from sitting to standing. These episodes occur frequently when getting out of bed or rising from a chair, and occasionally when working outside. A significant episode of dizziness recently prompted this visit, during which he felt 'staggering' and was concerned about his heart health.  He and his wife have been working a lot outside.  They tell me that he has been chopping down a lot of trees.  He tells me he sweats profusely during these times outside.  His water intake has not really changed.     Current Medications: Current Meds  Medication Sig   allopurinol (ZYLOPRIM) 300 MG tablet Take 300 mg by mouth in the morning.   amLODipine  (NORVASC ) 5 MG tablet Take 1 tablet (5 mg total) by mouth daily.   amoxicillin  (AMOXIL ) 500 MG capsule Take 2 Gms ( 4 capsules ) 1 hour before dental work   amoxicillin -clavulanate (AUGMENTIN ) 500-125 MG tablet Take 1 tablet by mouth every 8 (eight) hours.   Azelastine HCl 137 MCG/SPRAY SOLN Place into both nostrils.   Biotin 1000 MCG tablet Take 1,000 mcg by mouth in the morning.   carbamide peroxide (DEBROX) 6.5 % OTIC solution 5 drops 2 (two) times a week. Wax build up   ELIQUIS 5 MG TABS tablet Take 5 mg by mouth 2 (two) times daily.   Evolocumab  (REPATHA  SURECLICK) 140 MG/ML SOAJ Inject 140 mg into the skin every 14 (fourteen) days.   fish oil-omega-3 fatty acids 1000 MG capsule Take 1 g by mouth in the morning and at bedtime.   HYDROcodone -acetaminophen  (NORCO) 5-325 MG tablet Take 2 tablets by mouth every 4 (four) hours as needed.   losartan  (COZAAR ) 50 MG tablet Take 1 tablet (50 mg total) by  mouth daily. (Patient taking differently: Take 50 mg by mouth every evening.)   pantoprazole  (PROTONIX ) 40 MG tablet Take 40 mg by mouth daily.   Polyethyl Glycol-Propyl Glycol (LUBRICANT EYE DROPS) 0.4-0.3 % SOLN Place 1-2 drops into both eyes 3 (three) times daily as needed (tired/dry/irritated eyes.).   pyridostigmine  (MESTINON ) 60 MG tablet TAKE 1 TABLET BY MOUTH 3 TIMES DAILY.     Review of Systems:   Please see the history of present illness.    All other systems reviewed and are negative.     EKGs/Labs/Other Test Reviewed:   EKG: January 2025 sinus rhythm  EKG Interpretation Date/Time:  Monday November 13 2023 15:10:05 EDT Ventricular Rate:  67 PR Interval:  204 QRS Duration:  74 QT Interval:  400 QTC Calculation: 422 R Axis:   62  Text Interpretation: Sinus rhythm with occasional Premature ventricular complexes Nonspecific T wave abnormality When compared with ECG of 24-May-2023 09:55, Premature ventricular complexes are now Present Confirmed by Wendel Haws ([id number]) on 11/13/2023 3:17:53 PM  Risk Assessment/Calculations:          Physical Exam:   VS:  BP 117/65 (Patient Position: Standing) Comment (Patient Position): 3 minutes  Pulse 69   Ht 5' 9 (1.753 m)   Wt 188 lb (85.3 kg)   SpO2 95%   BMI 27.76 kg/m        Wt Readings from Last 3 Encounters:  11/13/23 188 lb (85.3 kg)  05/24/23 178 lb (80.7 kg)  03/17/23 173 lb (78.5 kg)      GENERAL:  No apparent distress, AOx3 HEENT:  No carotid bruits, +2 carotid impulses, no scleral icterus CAR: RRR no murmurs, gallops, rubs, or thrills RES:  Clear to auscultation bilaterally ABD:  Soft, nontender, nondistended, positive bowel sounds x 4 VASC:  +2 radial pulses, +2 carotid pulses NEURO:  CN 2-12 grossly intact; motor and sensory grossly intact PSYCH:  No active depression or anxiety EXT:  No edema, ecchymosis, or cyanosis  Signed, Braylin Formby K Tanaiya Kolarik, MD  11/13/2023 3:25 PM    [facility]  [facility] [address] [place], [place], [place]  [address] Phone: [phone]; Fax: [phone]   Note:  This document was prepared using Dragon voice recognition software and may include unintentional dictation errors.

## 2023-11-13 ENCOUNTER — Other Ambulatory Visit: Payer: Self-pay

## 2023-11-13 ENCOUNTER — Encounter: Payer: Self-pay | Admitting: Internal Medicine

## 2023-11-13 ENCOUNTER — Ambulatory Visit: Attending: Internal Medicine | Admitting: Internal Medicine

## 2023-11-13 VITALS — BP 117/65 | HR 69 | Ht 69.0 in | Wt 188.0 lb

## 2023-11-13 DIAGNOSIS — I700 Atherosclerosis of aorta: Secondary | ICD-10-CM | POA: Diagnosis not present

## 2023-11-13 DIAGNOSIS — Z951 Presence of aortocoronary bypass graft: Secondary | ICD-10-CM | POA: Diagnosis not present

## 2023-11-13 DIAGNOSIS — E785 Hyperlipidemia, unspecified: Secondary | ICD-10-CM | POA: Diagnosis not present

## 2023-11-13 DIAGNOSIS — R42 Dizziness and giddiness: Secondary | ICD-10-CM | POA: Diagnosis not present

## 2023-11-13 DIAGNOSIS — I10 Essential (primary) hypertension: Secondary | ICD-10-CM | POA: Diagnosis not present

## 2023-11-13 DIAGNOSIS — R55 Syncope and collapse: Secondary | ICD-10-CM

## 2023-11-13 DIAGNOSIS — D6869 Other thrombophilia: Secondary | ICD-10-CM

## 2023-11-13 DIAGNOSIS — Z952 Presence of prosthetic heart valve: Secondary | ICD-10-CM

## 2023-11-13 DIAGNOSIS — G7000 Myasthenia gravis without (acute) exacerbation: Secondary | ICD-10-CM

## 2023-11-13 DIAGNOSIS — Z6826 Body mass index (BMI) 26.0-26.9, adult: Secondary | ICD-10-CM

## 2023-11-13 DIAGNOSIS — I82409 Acute embolism and thrombosis of unspecified deep veins of unspecified lower extremity: Secondary | ICD-10-CM

## 2023-11-13 NOTE — Patient Instructions (Signed)
 Medication Instructions:  Your physician has recommended you make the following change in your medication:  1.) stop losartan   *If you need a refill on your cardiac medications before your next appointment, please call your pharmacy*  Lab Work: Today: cbc, cmet, reflex tsh  If you have labs (blood work) drawn today and your tests are completely normal, you will receive your results only by: MyChart Message (if you have MyChart) OR A paper copy in the mail If you have any lab test that is abnormal or we need to change your treatment, we will call you to review the results.  Testing/Procedures: none  Follow-Up: At [facility], you and your health needs are our priority.  As part of our continuing mission to provide you with exceptional heart care, our providers are all part of one team.  This team includes your primary Cardiologist (physician) and Advanced Practice Providers or APPs (Physician Assistants and Nurse Practitioners) who all work together to provide you with the care you need, when you need it.  Your next appointment:   1-2 week(s)  Provider:   Lonni LITTIE Nanas, MD or Josefa Beauvais, NP, Orren Fabry, PA-C, Jackee Alberts, NP, Jon Hails, PA-C, Dayna Dunn, PA-C, Madison Fountain, NP, Callie Goodrich, PA-C, Kathleen Johnson, PA-C, Lamarr Satterfield, NP, Olivia Pavy, PA-C, Hao Meng, PA-C, Damien Braver, NP, Glendia Ferrier, PA-C, or Katlyn West, NP

## 2023-11-14 ENCOUNTER — Ambulatory Visit: Payer: Self-pay | Admitting: Internal Medicine

## 2023-11-14 LAB — CBC
Hematocrit: 40.1 % (ref 37.5–51.0)
Hemoglobin: 13.2 g/dL (ref 13.0–17.7)
MCH: 31.2 pg (ref 26.6–33.0)
MCHC: 32.9 g/dL (ref 31.5–35.7)
MCV: 95 fL (ref 79–97)
Platelets: 212 x10E3/uL (ref 150–450)
RBC: 4.23 x10E6/uL (ref 4.14–5.80)
RDW: 13.2 % (ref 11.6–15.4)
WBC: 7.8 x10E3/uL (ref 3.4–10.8)

## 2023-11-14 LAB — COMPREHENSIVE METABOLIC PANEL WITH GFR
ALT: 19 IU/L (ref 0–44)
AST: 29 IU/L (ref 0–40)
Albumin: 4.1 g/dL (ref 3.8–4.8)
Alkaline Phosphatase: 68 IU/L (ref 44–121)
BUN/Creatinine Ratio: 19 (ref 10–24)
BUN: 26 mg/dL (ref 8–27)
Bilirubin Total: 0.3 mg/dL (ref 0.0–1.2)
CO2: 21 mmol/L (ref 20–29)
Calcium: 9.1 mg/dL (ref 8.6–10.2)
Chloride: 105 mmol/L (ref 96–106)
Creatinine, Ser: 1.36 mg/dL — ABNORMAL HIGH (ref 0.76–1.27)
Globulin, Total: 2.6 g/dL (ref 1.5–4.5)
Glucose: 104 mg/dL — ABNORMAL HIGH (ref 70–99)
Potassium: 4.3 mmol/L (ref 3.5–5.2)
Sodium: 140 mmol/L (ref 134–144)
Total Protein: 6.7 g/dL (ref 6.0–8.5)
eGFR: 54 mL/min/1.73 — ABNORMAL LOW (ref >59)

## 2023-11-14 LAB — TSH RFX ON ABNORMAL TO FREE T4: TSH: 2.38 u[IU]/mL (ref 0.450–4.500)

## 2023-11-19 NOTE — Progress Notes (Unsigned)
 Cardiology Office Note    Date:  11/21/2023  ID:  [name], DOB 11/03/1945, MRN [id number] PCP:  Kip Righter, MD  Cardiologist:  Lonni LITTIE Nanas, MD  Electrophysiologist:  OLE ONEIDA HOLTS, MD   Chief Complaint: Follow-up for dizziness  History of Present Illness: .    Edward [name] is a 78 y.o. male with visit-pertinent history of CAD s/p CABG, aortic stenosis, hypertension, hyperlipidemia, myasthenia gravis, OSA, DVT.  Echocardiogram 1//2019 showed LVEF 60 to 65%, grade 1 diastolic dysfunction, functionally bicuspid aortic valve with mild to moderate aortic stenosis and mild aortic regurgitation, mild MR, mild TR.  Echo on 10/14/2020 showed normal biventricular function, mild LVH, mild to moderate aortic stenosis, mild aortic regurgitation, mild mitral regurgitation.  Lexiscan  Myoview  on 11/05/2020 showed fixed inferior defect likely representing artifact, no ischemia, EF 52%.  Calcium  score on 11/30/2020 was 674, 72nd percentile.  Patient continued of chest pain underwent cath on 02/11/2021 which showed severe multivessel CAD, moderate AS, normal filling pressures.  Patient underwent CABG x 4 with LIMA to LAD, SVG to PDA, SVG to OM, SVG to D2 Y graft off OM, aortic valve replacement with 22 mm Inspiris Resilia on 03/03/2021.  Patient's postop course was complicated by atrial fibrillation which was managed with amiodarone  and beta-blocker.  Patient also developed thrombocytopenia with platelets down to 62, but recovered without intervention.  Echo on 04/30/2021 showed EF 50 to 55%, mildly reduced RV function, normal functioning bioprosthetic arctic valve.  Zio patch worn for 3 days on 08/24/2021 showed rare PVCs with less than 1% burden.   On 11/08/2023 patient notified the office of increasing intermittent dizziness.  Patient was seen on 11/13/2023 by Dr. Wendel, noted to have orthostatic hypotension with decrease in systolic blood pressure from 126/78 in supine position to 103/52 upon  standing.  Patient's heart rate increased from 58 to 66 bpm.  Patient's losartan  was discontinued and he was continued on amlodipine  5 mg daily.  Today he presents for follow-up.  He reports that he is doing well overall, he denies chest pain, shortness of breath, lower extremity edema, orthopnea or pnd. He reports his dizziness has overall resolved with discontinuation of Losartan , his blood pressures have been well controlled at home. He is active at home, regularly working in the yard and tolerating well.  Patient does endorse increased cramping in his legs in recent months, questions if possibly related to use of Repatha .  ROS: .   Today he denies chest pain, shortness of breath, lower extremity edema, fatigue, palpitations, melena, hematuria, hemoptysis, diaphoresis, weakness, presyncope, syncope, orthopnea, and PND.  All other systems are reviewed and otherwise negative. Studies Reviewed: SABRA   EKG:  EKG is not ordered today.  CV Studies: Cardiac studies reviewed are outlined and summarized above. Otherwise please see EMR for full report. Cardiac Studies & Procedures   ______________________________________________________________________________________________ CARDIAC CATHETERIZATION  CARDIAC CATHETERIZATION 02/11/2021  Conclusion   Mid LAD-1 lesion is 90% stenosed.   Mid LAD-2 lesion is 90% stenosed.   Mid LAD-3 lesion is 80% stenosed.   1st Mrg lesion is 90% stenosed.   Prox Cx to Mid Cx lesion is 70% stenosed.   Prox RCA lesion is 100% stenosed.   The left ventricular systolic function is normal.   LV end diastolic pressure is normal.   The left ventricular ejection fraction is 55-65% by visual estimate.   There is moderate aortic valve stenosis.   There is no mitral valve regurgitation.  Severe 3  vessel obstructive CAD Moderate aortic stenosis. Mean gradient 22 mm Hg. AVA 1.49 cm squared, index 0.71 Normal LV filling pressures. Normal right heart pressures Normal cardiac  output.  Plan: refer to CT surgery for CABG and AVR.  Findings Coronary Findings Diagnostic  Dominance: Right  Left Main Vessel was injected. Vessel is normal in caliber. Vessel is angiographically normal.  Left Anterior Descending Mid LAD-1 lesion is 90% stenosed. Mid LAD-2 lesion is 90% stenosed. Mid LAD-3 lesion is 80% stenosed.  Left Circumflex Prox Cx to Mid Cx lesion is 70% stenosed.  First Obtuse Marginal Branch 1st Mrg lesion is 90% stenosed.  Second Obtuse Marginal Branch Vessel is large in size.  Right Coronary Artery Vessel is large. Prox RCA lesion is 100% stenosed.  Right Posterior Descending Artery Collaterals RPDA filled by collaterals from Dist LAD.  Right Posterior Atrioventricular Artery Collaterals RPAV filled by collaterals from Dist Cx.  Intervention  No interventions have been documented.   STRESS TESTS  MYOCARDIAL PERFUSION IMAGING 11/05/2020  Interpretation Summary  The left ventricular ejection fraction is mildly decreased (45-54%).  Nuclear stress EF: 52%.  There was no ST segment deviation noted during stress.  This is a low risk study.  Cannot r/o small mid/basal wall infarct vs diaphragmatic attenuation no ischemia EF mildly reduced 52% no defined RWMA   ECHOCARDIOGRAM  ECHOCARDIOGRAM COMPLETE 04/30/2021  Narrative ECHOCARDIOGRAM REPORT    Patient Name:   Edward [name]  Date of Exam: 04/30/2021 Medical Rec #:  [id number]     Height:       69.0 in Accession #:    [id number]    Weight:       201.0 lb Date of Birth:  1945/05/20      BSA:          2.070 m Patient Age:    75 years      BP:           114/54 mmHg Patient Gender: M             HR:           57 bpm. Exam Location:  [address]  Procedure: Intracardiac Opacification Agent, 2D Echo, Cardiac Doppler and Color Doppler  Indications:    I35.9* Aortic valve disease, unspecified  History:        Patient has prior history of Echocardiogram examinations,  most recent 03/03/2021. Prior CABG, Arrythmias:PVC, Signs/Symptoms:Murmur; Risk Factors:Hypertension and Dyslipidemia. Aortic valve replacement. Aortic stenosis due to bicuspid aortic valve. Obstructive sleep apnea. Obesity.  Sonographer:    Jon Hacker RCS Referring Phys: [id number] HARRELL O LIGHTFOOT  IMPRESSIONS   1. Left ventricular ejection fraction, by estimation, is 50 to 55%. The left ventricle has low normal function. The left ventricle has no regional wall motion abnormalities. There is mild asymmetric left ventricular hypertrophy of the basal-septal segment. Left ventricular diastolic parameters are consistent with Grade I diastolic dysfunction (impaired relaxation). 2. Right ventricular systolic function is mildly reduced. The right ventricular size is normal. There is normal pulmonary artery systolic pressure. The estimated right ventricular systolic pressure is 25.3 mmHg. 3. Left atrial size was mildly dilated. 4. The mitral valve is degenerative. Trivial mitral valve regurgitation. Moderate mitral annular calcification. 5. There is a 25 mm InspirEase Resilia bioprosthetic valve present in the aortic position. Valve appears well seated with normal function by color doppler. Mean gradient , Vmax 1.49m/s, DI 0.65. No aortic regurgitation. 6. The inferior vena cava is normal in size with greater than  50% respiratory variability, suggesting right atrial pressure of 3 mmHg.  Comparison(s): Compared to prior TTE on 10/14/20, the patient is now s/p AVR. EF now ~55% (previously 60-65%).  FINDINGS Left Ventricle: Left ventricular ejection fraction, by estimation, is 50 to 55%. The left ventricle has low normal function. The left ventricle has no regional wall motion abnormalities. The left ventricular internal cavity size was normal in size. There is mild asymmetric left ventricular hypertrophy of the basal-septal segment. Abnormal (paradoxical) septal motion consistent with  post-operative status. Left ventricular diastolic parameters are consistent with Grade I diastolic dysfunction (impaired relaxation).  Right Ventricle: The right ventricular size is normal. Right vetricular wall thickness was not well visualized. Right ventricular systolic function is mildly reduced. There is normal pulmonary artery systolic pressure. The tricuspid regurgitant velocity is 2.36 m/s, and with an assumed right atrial pressure of 3 mmHg, the estimated right ventricular systolic pressure is 25.3 mmHg.  Left Atrium: Left atrial size was mildly dilated.  Right Atrium: Right atrial size was normal in size.  Pericardium: There is no evidence of pericardial effusion.  Mitral Valve: The mitral valve is degenerative in appearance. There is mild thickening of the mitral valve leaflet(s). There is mild calcification of the mitral valve leaflet(s). Moderate mitral annular calcification. Trivial mitral valve regurgitation.  Tricuspid Valve: The tricuspid valve is normal in structure. Tricuspid valve regurgitation is trivial.  Aortic Valve: There is a 25 mm InspirEase Resilia bioprosthetic valve present in the aortic position. Valve appears well seated with normal function by color doppler. Mean gradient , Vmax 1.47m/s, DI 0.65. No aortic regurgitation. The aortic valve has been repaired/replaced. Aortic valve regurgitation is not visualized. Aortic valve mean gradient measures 7.0 mmHg. Aortic valve peak gradient measures 13.7 mmHg. Aortic valve area, by VTI measures 2.08 cm. Echo findings are consistent with normal structure and function of the aortic valve prosthesis.  Pulmonic Valve: The pulmonic valve was not well visualized. Pulmonic valve regurgitation is trivial.  Aorta: The aortic root was not well visualized.  Venous: The inferior vena cava is normal in size with greater than 50% respiratory variability, suggesting right atrial pressure of 3 mmHg.  IAS/Shunts: The atrial  septum is grossly normal.   LEFT VENTRICLE PLAX 2D LVIDd:         4.30 cm   Diastology LVIDs:         3.40 cm   LV e' medial:    6.45 cm/s LV PW:         0.90 cm   LV E/e' medial:  16.1 LV IVS:        1.40 cm   LV e' lateral:   15.10 cm/s LVOT diam:     2.00 cm   LV E/e' lateral: 6.9 LV SV:         83 LV SV Index:   40 LVOT Area:     3.14 cm   RIGHT VENTRICLE RV Basal diam:  2.80 cm RV S prime:     8.40 cm/s TAPSE (M-mode): 1.3 cm RVSP:           25.3 mmHg  LEFT ATRIUM             Index        RIGHT ATRIUM           Index LA diam:        5.20 cm 2.51 cm/m   RA Pressure: 3.00 mmHg LA Vol (A2C):   63.0 ml 30.43 ml/m  RA  Area:     17.50 cm LA Vol (A4C):   69.5 ml 33.57 ml/m  RA Volume:   45.00 ml  21.74 ml/m LA Biplane Vol: 73.1 ml 35.31 ml/m AORTIC VALVE AV Area (Vmax):    2.05 cm AV Area (Vmean):   1.94 cm AV Area (VTI):     2.08 cm AV Vmax:           185.00 cm/s AV Vmean:          121.000 cm/s AV VTI:            0.397 m AV Peak Grad:      13.7 mmHg AV Mean Grad:      7.0 mmHg LVOT Vmax:         121.00 cm/s LVOT Vmean:        74.900 cm/s LVOT VTI:          0.263 m LVOT/AV VTI ratio: 0.66  MITRAL VALVE                TRICUSPID VALVE MV Area (PHT): 2.60 cm     TR Peak grad:   22.3 mmHg MV Decel Time: 292 msec     TR Vmax:        236.00 cm/s MV E velocity: 104.00 cm/s  Estimated RAP:  3.00 mmHg MV A velocity: 104.00 cm/s  RVSP:           25.3 mmHg MV E/A ratio:  1.00 SHUNTS Systemic VTI:  0.26 m Systemic Diam: 2.00 cm  Powell Sorrow MD Electronically signed by Powell Sorrow MD Signature Date/Time: 04/30/2021/4:14:49 PM    Final   TEE  ECHO INTRAOPERATIVE TEE 03/03/2021  Narrative *INTRAOPERATIVE TRANSESOPHAGEAL REPORT *    Patient Name:   NORLEEN DELENA POISSON   Date of Exam: 03/03/2021 Medical Rec #:  [id number]      Height:       69.0 in Accession #:    [id number]     Weight:       204.0 lb Date of Birth:  1946/04/01       BSA:          2.08  m Patient Age:    75 years       BP:           117/57 mmHg Patient Gender: M              HR:           67 bpm. Exam Location:  Anesthesiology  Transesophogeal exam was perform intraoperatively during surgical procedure. Patient was closely monitored under general anesthesia during the entirety of examination.  Indications:     Aortic Valve Disease, Coronary Artery Disease Sonographer:     Lauraine Pilot RDCS Performing Phys: [id number] LINNIE KIDD LIGHTFOOT Diagnosing Phys: Lamar Needle MD  Complications: No known complications during this procedure. POST-OP IMPRESSIONS _ Left Ventricle: The left ventricle is unchanged from pre-bypass. _ Right Ventricle: The right ventricle appears unchanged from pre-bypass. _ Aorta: The aorta appears unchanged from pre-bypass. _ Left Atrium: The left atrium appears unchanged from pre-bypass. _ Left Atrial Appendage: The left atrial appendage appears unchanged from pre-bypass. _ Aortic Valve: A bioprosthetic valve was placed, leaflets are freely mobile and leaflets thin. The gradient recorded across the prosthetic valve is within the expected range. No perivalvular leak noted. _ Mitral Valve: The mitral valve appears unchanged from pre-bypass. _ Tricuspid Valve: The tricuspid valve appears unchanged from pre-bypass. _ Pulmonic Valve: The pulmonic valve  appears unchanged from pre-bypass. _ Interatrial Septum: The interatrial septum appears unchanged from pre-bypass. _ Interventricular Septum: The interventricular septum appears unchanged from pre-bypass. _ Pericardium: The pericardium appears unchanged from pre-bypass.  PRE-OP FINDINGS Left Ventricle: The left ventricle has normal systolic function, with an ejection fraction of 55-60%. The cavity size was normal. Concentric left ventricular hypertrophy.   Right Ventricle: The right ventricle has normal systolic function. The cavity was normal. There is no increase in right ventricular wall  thickness.  Left Atrium: Left atrial size was normal in size. No left atrial/left atrial appendage thrombus was detected.  Right Atrium: Right atrial size was normal in size.  Interatrial Septum: No atrial level shunt detected by color flow Doppler.  Pericardium: There is no evidence of pericardial effusion.  Mitral Valve: The mitral valve is normal in structure. Mitral valve regurgitation is moderate by color flow Doppler. There is No evidence of mitral stenosis.  Tricuspid Valve: The tricuspid valve was normal in structure. Tricuspid valve regurgitation was not visualized by color flow Doppler.  Aortic Valve: The aortic valve is tricuspid Aortic valve regurgitation is moderate by color flow Doppler. There is moderate stenosis of the aortic valve, with a calculated valve area of 2.12 cm. There is severe thickening and moderate calcification present on the aortic valve right coronary, non-coronary and left coronary cusps with moderately decreased mobility.  Pulmonic Valve: The pulmonic valve was normal in structure, with normal. Pulmonic valve regurgitation is trivial by color flow Doppler.   Aorta: The aortic root and ascending aorta are normal in size and structure. There is evidence of plaque in the aortic arch and descending aorta; Grade V, >=85mm and mobile.  +--------------+--------++ LEFT VENTRICLE         +--------------+--------++ PLAX 2D                +--------------+--------++ LVOT diam:    2.30 cm  +--------------+--------++ LVOT Area:    4.15 cm +--------------+--------++                        +--------------+--------++  +------------------+------------++ AORTIC VALVE                   +------------------+------------++ AV Area (Vmax):   2.19 cm     +------------------+------------++ AV Area (Vmean):  1.99 cm     +------------------+------------++ AV Area (VTI):    2.12 cm     +------------------+------------++ AV  Vmax:          233.00 cm/s  +------------------+------------++ AV Vmean:         171.000 cm/s +------------------+------------++ AV VTI:           0.576 m      +------------------+------------++ AV Peak Grad:     21.7 mmHg    +------------------+------------++ AV Mean Grad:     14.5 mmHg    +------------------+------------++ LVOT Vmax:        123.00 cm/s  +------------------+------------++ LVOT Vmean:       82.100 cm/s  +------------------+------------++ LVOT VTI:         0.294 m      +------------------+------------++ LVOT/AV VTI ratio:0.51         +------------------+------------++ AR PHT:           776 msec     +------------------+------------++   +--------------+-------+ SHUNTS                +--------------+-------+ Systemic VTI: 0.29 m  +--------------+-------+ Systemic Diam:2.30 cm +--------------+-------+  Lamar Needle MD Electronically signed by Lamar Needle MD Signature Date/Time: 03/07/2021/5:17:22 AM    Final  MONITORS  LONG TERM MONITOR (3-14 DAYS) 08/24/2021  Narrative  4 episodes of SVT, longest lasting 10 beats  Rare PVCs (less than 1% of beats)  No significant arrhythmias   Patch Wear Time:  3 days and 4 hours (2023-04-22T11:10:51-0400 to 2023-04-25T15:37:51-0400)  Patient had a min HR of 44 bpm, max HR of 164 bpm, and avg HR of 70 bpm. Predominant underlying rhythm was Sinus Rhythm. 4 Supraventricular Tachycardia runs occurred, the run with the fastest interval lasting 12 beats with a max rate of 164 bpm, the longest lasting 10 beats with an avg rate of 134 bpm. Isolated SVEs were rare (<1.0%), SVE Couplets were rare (<1.0%), and SVE Triplets were rare (<1.0%). Isolated VEs were rare (<1.0%), VE Couplets were rare (<1.0%), and no VE Triplets were present.  No patient triggered event   CT SCANS  CT CARDIAC SCORING (SELF PAY ONLY) 11/30/2020  Addendum 11/30/2020  4:35 PM ADDENDUM REPORT:  11/30/2020 16:33  CLINICAL DATA:  Cardiovascular Disease Risk stratification  EXAM: Coronary Calcium  Score  TECHNIQUE: A gated, non-contrast computed tomography scan of the heart was performed using 3mm slice thickness. Axial images were analyzed on a dedicated workstation. Calcium  scoring of the coronary arteries was performed using the Agatston method.  FINDINGS: Coronary Calcium  Score:  Left main: 195  Left anterior descending artery: 317  Left circumflex artery: 49  Right coronary artery: 113  Total: 674  Percentile: 72nd  Pericardium: Normal.  Non-cardiac: See separate report from [facility].  IMPRESSION: Coronary calcium  score of 674. This was 72nd percentile for age-, race-, and sex-matched controls.  RECOMMENDATIONS: Coronary artery calcium  (CAC) score is a strong predictor of incident coronary heart disease (CHD) and provides predictive information beyond traditional risk factors. CAC scoring is reasonable to use in the decision to withhold, postpone, or initiate statin therapy in intermediate-risk or selected borderline-risk asymptomatic adults (age 45-75 years and LDL-C >=70 to <190 mg/dL) who do not have diabetes or established atherosclerotic cardiovascular disease (ASCVD).* In intermediate-risk (10-year ASCVD risk >=7.5% to <20%) adults or selected borderline-risk (10-year ASCVD risk >=5% to <7.5%) adults in whom a CAC score is measured for the purpose of making a treatment decision the following recommendations have been made:  If CAC=0, it is reasonable to withhold statin therapy and reassess in 5 to 10 years, as long as higher risk conditions are absent (diabetes mellitus, family history of premature CHD in first degree relatives (males <55 years; females <65 years), cigarette smoking, or LDL >=190 mg/dL).  If CAC is 1 to 99, it is reasonable to initiate statin therapy for patients >=60 years of age.  If CAC is >=100 or >=75th  percentile, it is reasonable to initiate statin therapy at any age.  Cardiology referral should be considered for patients with CAC scores >=400 or >=75th percentile.  *2018 AHA/ACC/AACVPR/AAPA/ABC/ACPM/ADA/AGS/APhA/ASPC/NLA/PCNA Guideline on the Management of Blood Cholesterol: A Report of the American College of Cardiology/American Heart Association Task Force on Clinical Practice Guidelines. J Am Coll Cardiol. 2019;73(24):3168-3209.  Darryle Decent, MD   Electronically Signed By: Darryle Decent On: 11/30/2020 16:33  Narrative EXAM: OVER-READ INTERPRETATION  CT CHEST  The following report is an over-read performed by radiologist Dr. Toribio Aye of [facility] on 11/30/2020. This over-read does not include interpretation of cardiac or coronary anatomy or pathology. The coronary calcium  score interpretation by the cardiologist is attached.  COMPARISON:  None.  FINDINGS: Atherosclerotic calcifications in the thoracic aorta. Within the visualized portions of the thorax there are no suspicious appearing pulmonary nodules or masses, there is no acute consolidative airspace disease, no pleural effusions, no pneumothorax and no lymphadenopathy. Visualized portions of the upper abdomen are unremarkable. There are no aggressive appearing lytic or blastic lesions noted in the visualized portions of the skeleton.  IMPRESSION: 1.  Aortic Atherosclerosis (ICD10-I70.0).  Electronically Signed: By: Toribio Aye M.D. On: 11/30/2020 09:30     ______________________________________________________________________________________________       Current Reported Medications:.    Current Meds  Medication Sig   allopurinol (ZYLOPRIM) 300 MG tablet Take 300 mg by mouth in the morning.   amLODipine  (NORVASC ) 5 MG tablet Take 1 tablet (5 mg total) by mouth daily.   amoxicillin  (AMOXIL ) 500 MG capsule Take 2 Gms ( 4 capsules ) 1 hour before dental work    amoxicillin -clavulanate (AUGMENTIN ) 500-125 MG tablet Take 1 tablet by mouth every 8 (eight) hours.   Azelastine HCl 137 MCG/SPRAY SOLN Place into both nostrils.   Biotin 1000 MCG tablet Take 1,000 mcg by mouth in the morning.   carbamide peroxide (DEBROX) 6.5 % OTIC solution 5 drops 2 (two) times a week. Wax build up   ELIQUIS 5 MG TABS tablet Take 5 mg by mouth 2 (two) times daily.   Evolocumab  (REPATHA  SURECLICK) 140 MG/ML SOAJ Inject 140 mg into the skin every 14 (fourteen) days.   fish oil-omega-3 fatty acids 1000 MG capsule Take 1 g by mouth in the morning and at bedtime.   HYDROcodone -acetaminophen  (NORCO) 5-325 MG tablet Take 2 tablets by mouth every 4 (four) hours as needed.   pantoprazole  (PROTONIX ) 40 MG tablet Take 40 mg by mouth daily.   Polyethyl Glycol-Propyl Glycol (LUBRICANT EYE DROPS) 0.4-0.3 % SOLN Place 1-2 drops into both eyes 3 (three) times daily as needed (tired/dry/irritated eyes.).   pyridostigmine  (MESTINON ) 60 MG tablet TAKE 1 TABLET BY MOUTH 3 TIMES DAILY.    Physical Exam:    VS:  BP 132/78   Pulse (!) 59   Ht 5' 9 (1.753 m)   Wt 184 lb 12.8 oz (83.8 kg)   SpO2 97%   BMI 27.29 kg/m    Wt Readings from Last 3 Encounters:  11/21/23 184 lb 12.8 oz (83.8 kg)  11/13/23 188 lb (85.3 kg)  05/24/23 178 lb (80.7 kg)    GEN: Well nourished, well developed in no acute distress NECK: No JVD; No carotid bruits CARDIAC: RRR, no murmurs, rubs, gallops RESPIRATORY:  Clear to auscultation without rales, wheezing or rhonchi  ABDOMEN: Soft, non-tender, non-distended EXTREMITIES:  No edema; No acute deformity     Asessement and Plan:.    CAD: S/p CABG x 4 with LIMA to LAD, SVG to PDA, SVG to OM, SVG to D2 Y graft off OM on 03/03/2021. Stable with no anginal symptoms. No indication for ischemic evaluation.  Heart healthy diet and regular cardiovascular exercise encouraged.  Continue amlodipine  5 mg daily, Repatha .  Aortic stenosis: s/p AVR.  Cath in 01/2021 suggested  moderate aortic stenosis.  Patient underwent aortic valve replacement with 25 mm Inspiris Resilia on 03/03/2021 at time of CABG.  Echo on 04/30/2021 showed EF 5055%, mildly reduced RV function, normal functioning bioprosthetic aortic valve.  Patient denies any chest pain or increased shortness of breath or lower extremity edema.  Reviewed SBE precautions.  HTN/orthostatic hypotension: Blood pressure today 132/78.  At office visit last week patient was noted  to be orthostatic with increased dizziness.  Patient reports that this has overall resolved with discontinuation of losartan , is now doing well.  Encourage patient to stay well-hydrated and monitor his blood pressure at home, to notify the office if consistently elevated above 140/90.  Continue amlodipine  5 mg daily.  HLD: Last lipid profile on 03/30/2023 indicated total cholesterol 129, HDL 42, triglycerides 123 and LDL 65. Patient with history of statin intolerance.  Patient reports increased cramping in his legs in recent months, questions if related to Repatha  versus myasthenia gravis.  Discussed holding his Repatha  to see if improvement in symptoms, patient reports he will consider this and let us  know if he has any improvement with medication vacation, if improvement will refer back to Pharm.D.  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  PVCs: Prior Zio patch worn for 3 days in 11/2020 showed 95 episodes of NSVT with longest lasting 12 beats and frequent PVCs with a 14% burden, suspected related to ischemia in setting of severe multivessel disease.  Zio patch worn for 3 days in 08/2021 showed rare PVCs with less than 1% of beats.  PAF: Occurred in postoperative setting after CABG, patient was loaded with amiodarone , previously discontinued.  Continued on Eliquis.  OSA: On CPAP, reports compliance.  Preoperative cardiac evaluation: Patient reports that he is planned to  undergo colonoscopy soon, clearance request has not been received.  Patient is stable from a cardiac standpoint and is able to achieve greater than 4 METS of activity.  Patient can proceed with colonoscopy without further cardiac testing at this time.    Disposition: F/u with Dr. Kate in six months or sooner if needed.   Signed, Jassica Zazueta D Ksean Vale, NP

## 2023-11-20 DIAGNOSIS — G4733 Obstructive sleep apnea (adult) (pediatric): Secondary | ICD-10-CM | POA: Diagnosis not present

## 2023-11-21 ENCOUNTER — Ambulatory Visit: Attending: Internal Medicine | Admitting: Cardiology

## 2023-11-21 ENCOUNTER — Encounter: Payer: Self-pay | Admitting: Cardiology

## 2023-11-21 VITALS — BP 132/78 | HR 59 | Ht 69.0 in | Wt 184.8 lb

## 2023-11-21 DIAGNOSIS — I10 Essential (primary) hypertension: Secondary | ICD-10-CM | POA: Diagnosis not present

## 2023-11-21 DIAGNOSIS — I82409 Acute embolism and thrombosis of unspecified deep veins of unspecified lower extremity: Secondary | ICD-10-CM

## 2023-11-21 DIAGNOSIS — Z951 Presence of aortocoronary bypass graft: Secondary | ICD-10-CM

## 2023-11-21 DIAGNOSIS — R55 Syncope and collapse: Secondary | ICD-10-CM | POA: Diagnosis not present

## 2023-11-21 DIAGNOSIS — E785 Hyperlipidemia, unspecified: Secondary | ICD-10-CM | POA: Diagnosis not present

## 2023-11-21 DIAGNOSIS — Z952 Presence of prosthetic heart valve: Secondary | ICD-10-CM | POA: Diagnosis not present

## 2023-11-21 DIAGNOSIS — R42 Dizziness and giddiness: Secondary | ICD-10-CM

## 2023-11-21 DIAGNOSIS — I700 Atherosclerosis of aorta: Secondary | ICD-10-CM

## 2023-11-21 NOTE — Patient Instructions (Signed)
 Medication Instructions:  No changes *If you need a refill on your cardiac medications before your next appointment, please call your pharmacy*  Lab Work: No labs  Testing/Procedures: No testing  Follow-Up: At [facility], you and your health needs are our priority.  As part of our continuing mission to provide you with exceptional heart care, our providers are all part of one team.  This team includes your primary Cardiologist (physician) and Advanced Practice Providers or APPs (Physician Assistants and Nurse Practitioners) who all work together to provide you with the care you need, when you need it.  Your next appointment:   6 month(s)  Provider:   Wendie Hamburg, MD    We recommend signing up for the patient portal called "MyChart".  Sign up information is provided on this After Visit Summary.  MyChart is used to connect with patients for Virtual Visits (Telemedicine).  Patients are able to view lab/test results, encounter notes, upcoming appointments, etc.  Non-urgent messages can be sent to your provider as well.   To learn more about what you can do with MyChart, go to [email or web address].

## 2023-11-22 ENCOUNTER — Encounter: Payer: Self-pay | Admitting: Cardiology

## 2023-11-27 DIAGNOSIS — Z860101 Personal history of adenomatous and serrated colon polyps: Secondary | ICD-10-CM | POA: Diagnosis not present

## 2023-11-27 DIAGNOSIS — Z09 Encounter for follow-up examination after completed treatment for conditions other than malignant neoplasm: Secondary | ICD-10-CM | POA: Diagnosis not present

## 2023-11-27 DIAGNOSIS — K649 Unspecified hemorrhoids: Secondary | ICD-10-CM | POA: Diagnosis not present

## 2023-11-27 DIAGNOSIS — K5730 Diverticulosis of large intestine without perforation or abscess without bleeding: Secondary | ICD-10-CM | POA: Diagnosis not present

## 2023-11-27 DIAGNOSIS — D122 Benign neoplasm of ascending colon: Secondary | ICD-10-CM | POA: Diagnosis not present

## 2023-12-03 ENCOUNTER — Other Ambulatory Visit: Payer: Self-pay | Admitting: Cardiology

## 2023-12-06 DIAGNOSIS — I480 Paroxysmal atrial fibrillation: Secondary | ICD-10-CM | POA: Diagnosis not present

## 2023-12-06 DIAGNOSIS — R7303 Prediabetes: Secondary | ICD-10-CM | POA: Diagnosis not present

## 2023-12-06 DIAGNOSIS — M109 Gout, unspecified: Secondary | ICD-10-CM | POA: Diagnosis not present

## 2023-12-06 DIAGNOSIS — E782 Mixed hyperlipidemia: Secondary | ICD-10-CM | POA: Diagnosis not present

## 2023-12-06 DIAGNOSIS — D649 Anemia, unspecified: Secondary | ICD-10-CM | POA: Diagnosis not present

## 2023-12-06 DIAGNOSIS — Z951 Presence of aortocoronary bypass graft: Secondary | ICD-10-CM | POA: Diagnosis not present

## 2023-12-06 DIAGNOSIS — Z79899 Other long term (current) drug therapy: Secondary | ICD-10-CM | POA: Diagnosis not present

## 2023-12-06 DIAGNOSIS — K2270 Barrett's esophagus without dysplasia: Secondary | ICD-10-CM | POA: Diagnosis not present

## 2023-12-06 DIAGNOSIS — G7000 Myasthenia gravis without (acute) exacerbation: Secondary | ICD-10-CM | POA: Diagnosis not present

## 2023-12-06 DIAGNOSIS — N1831 Chronic kidney disease, stage 3a: Secondary | ICD-10-CM | POA: Diagnosis not present

## 2023-12-06 DIAGNOSIS — I10 Essential (primary) hypertension: Secondary | ICD-10-CM | POA: Diagnosis not present

## 2023-12-06 DIAGNOSIS — Z86718 Personal history of other venous thrombosis and embolism: Secondary | ICD-10-CM | POA: Diagnosis not present

## 2023-12-29 ENCOUNTER — Telehealth: Payer: Self-pay | Admitting: Cardiology

## 2023-12-29 ENCOUNTER — Other Ambulatory Visit: Payer: Self-pay | Admitting: Orthopedic Surgery

## 2023-12-29 DIAGNOSIS — M65322 Trigger finger, left index finger: Secondary | ICD-10-CM | POA: Diagnosis not present

## 2023-12-29 DIAGNOSIS — M65352 Trigger finger, left little finger: Secondary | ICD-10-CM | POA: Diagnosis not present

## 2023-12-29 DIAGNOSIS — M65332 Trigger finger, left middle finger: Secondary | ICD-10-CM | POA: Diagnosis not present

## 2023-12-29 NOTE — Telephone Encounter (Signed)
 Pharmacy please advise on holding apixaban prior to Left Index Finger and Left Long Finger Trigger Releases  scheduled for 02/15/2024. Last labs (CBC/BMET) were on 11/13/2023. Thank you.

## 2023-12-29 NOTE — Telephone Encounter (Signed)
   Pre-operative Risk Assessment    Patient Name: Edward [name]  DOB: [birth year] MRN: [id number]   Date of last office visit: 11/21/23 Date of next office visit: Not yet scheduled   Request for Surgical Clearance    Procedure:  Left Index Finger and Left Long Finger Trigger Releases  Date of Surgery:  Clearance 02/15/24                                Surgeon:  Dr. Franky Curia Surgeon's Group or Practice Name:  [facility] Phone number:  [phone]  Fax number:  [phone]   Type of Clearance Requested:   - Medical  - Pharmacy:  Hold Apixaban (Eliquis)     Type of Anesthesia:  Choice   Additional requests/questions:  Caller Sheliah) stated they will need to know how long to hold patient's Eliquis.  Signed, Jasmin B Wilson   12/29/2023, 3:42 PM

## 2024-01-02 NOTE — Telephone Encounter (Signed)
 Patient with diagnosis of DVT on Eliquis for anticoagulation.    Procedure:  Left Index Finger and Left Long Finger Trigger Releases   Date of Surgery:  Clearance 02/15/24  Chart indicates first DVT in 2018, second occurred in 2019, 2 weeks after stopping anticoagulation.  CrCl 54  Platelet count 212  Per office protocol, patient can hold Eliquis for 2 days prior to procedure.   Patient will not need bridging with Lovenox  (enoxaparin ) around procedure.  **This guidance is not considered finalized until pre-operative APP has relayed final recommendations.**

## 2024-01-02 NOTE — Telephone Encounter (Signed)
     Primary Cardiologist: Lonni LITTIE Nanas, MD  Chart reviewed as part of pre-operative protocol coverage. Given past medical history and time since last visit, based on ACC/AHA guidelines, ENZO TREU would be at acceptable risk for the planned procedure without further cardiovascular testing.   Per office protocol, patient can hold Eliquis for 2 days prior to procedure.   Patient will not need bridging with Lovenox  (enoxaparin ) around procedure.  I will route this recommendation to the requesting party via Epic fax function and remove from pre-op pool.  Please call with questions.  Josefa HERO. Jourdyn Hasler NP-C     01/02/2024, 11:00 AM [facility] [address] [place], [place] [address] Office [phone]

## 2024-01-04 ENCOUNTER — Encounter: Payer: Self-pay | Admitting: Cardiology

## 2024-01-09 ENCOUNTER — Other Ambulatory Visit: Payer: Self-pay | Admitting: Cardiology

## 2024-01-10 ENCOUNTER — Other Ambulatory Visit: Payer: Self-pay | Admitting: *Deleted

## 2024-01-10 MED ORDER — AMOXICILLIN 500 MG PO TABS: ORAL_TABLET | ORAL | 3 refills | Status: DC

## 2024-01-10 NOTE — Telephone Encounter (Signed)
 Pt of Dr. Kate. Please advise on this Amox request

## 2024-01-24 HISTORY — PX: OTHER SURGICAL HISTORY: SHX169

## 2024-01-26 DIAGNOSIS — M65322 Trigger finger, left index finger: Secondary | ICD-10-CM | POA: Diagnosis not present

## 2024-02-14 ENCOUNTER — Other Ambulatory Visit: Payer: Self-pay

## 2024-02-14 ENCOUNTER — Encounter (HOSPITAL_COMMUNITY): Payer: Self-pay | Admitting: Orthopedic Surgery

## 2024-02-14 NOTE — Progress Notes (Signed)
 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 Echocardiogram on 04/30/21 showed EF 50 to 55%, mildly reduced RV function, normal functioning bioprosthetic aortic valve. Zio patch x3 days on 08/24/2021 showed rare PVCs (less than 1% of beats).  Cardiac clearance per telephone encounter 01/02/2024 by Josefa Beauvais, NP, Chart reviewed as part of pre-operative protocol coverage. Given past medical history and time since last visit, based on ACC/AHA guidelines, Edward [name] would be at acceptable risk for the planned procedure without further cardiovascular testing. Per office protocol, patient can hold Eliquis for 2 days prior to procedure.  Patient will not need bridging with Lovenox  (enoxaparin ) around procedure.  Follows with neurology for history of myasthenia gravis, maintained on Mestinon  60 mg 2-3 times per day.  History of GERD on PPI.  Preop labs reviewed, creatinine mildly elevated 1.36, otherwise unremarkable.  EKG 11/13/2023: Sinus rhythm with occasional Premature ventricular complexes.  Rate 67. Nonspecific T wave abnormality  TTE 04/30/21: 1. Left ventricular ejection fraction, by estimation, is 50 to 55%. The  left ventricle has low normal function. The left ventricle has no regional  wall motion abnormalities. There is mild asymmetric left ventricular  hypertrophy of the basal-septal  segment. Left ventricular diastolic parameters are consistent with Grade I  diastolic dysfunction (impaired relaxation).   2. Right ventricular systolic function is mildly reduced. The right  ventricular size is normal. There is normal pulmonary artery systolic  pressure. The estimated right ventricular systolic pressure is 25.3 mmHg.   3. Left atrial size was  mildly dilated.   4. The mitral valve is degenerative. Trivial mitral valve regurgitation.  Moderate mitral annular calcification.   5. There is a 25 mm InspirEase Resilia bioprosthetic valve present in the  aortic position. Valve appears well seated with normal function by color  doppler. Mean gradient , Vmax 1.72m/s, DI 0.65. No aortic  regurgitation.   6. The inferior vena cava is normal in size with greater than 50%  respiratory variability, suggesting right atrial pressure of 3 mmHg.   Comparison(s): Compared to prior TTE on 10/14/20, the patient is now s/p  AVR. EF now ~55% (previously 60-65%).       Edward [name] [facility] Short Stay Center/Anesthesiology Phone [phone] 02/14/2024 9:32 AM

## 2024-02-14 NOTE — Progress Notes (Signed)
 PCP - Kip Righter, MD  Cardiologist - Kate Lonni CROME, MD    PPM/ICD - denies Device Orders - n/a Rep Notified - n/a  Chest x-ray -  EKG - 10-24-23 Stress Test - 11-05-20 ECHO - 04-30-21 Cardiac Cath - 02-11-2021  CPAP - nightly  Dm -denies  Blood Thinner Instructions: ELIQUIS Last dose 02-13-24 Aspirin  Instructions: denies  ERAS Protcol - clear liquids until 7:00 am  COVID TEST- n/a  Anesthesia review: Yes, Htn, Myasthenia gravis  Patient verbally denies any shortness of breath, fever, cough and chest pain during phone call   -------------  SDW INSTRUCTIONS given:  Your procedure is scheduled on February 15, 2024.  Report to [facility] Main Entrance A at 7:30 A.M., and check in at the Admitting office.  Call this number if you have problems the morning of surgery:  [phone]   Remember:  Do not eat after midnight the night before your surgery  You may drink clear liquids until 7:00 the morning of your surgery.   Clear liquids allowed are: Water, Non-Citrus Juices (without pulp), Carbonated Beverages, Clear Tea, Black Coffee Only, and Gatorade    Take these medicines the morning of surgery with A SIP OF WATER  allopurinol (ZYLOPRIM)  amLODipine  (NORVASC )  dicyclomine (BENTYL)  pantoprazole  (PROTONIX )  pyridostigmine  (MESTINON )   As of today, STOP taking any Aspirin  (unless otherwise instructed by your surgeon) Aleve, Naproxen, Ibuprofen, Motrin, Advil, Goody's, BC's, all herbal medications, fish oil, and all vitamins. THIS INCLUDES YOUR fish oil-omega-3 fatty acids                       Do not wear jewelry, make up, or nail polish            Do not wear lotions, powders, perfumes/colognes, or deodorant.            Do not shave 48 hours prior to surgery.  Men may shave face and neck.            Do not bring valuables to the hospital.            [facility] is not responsible for any belongings or valuables.  Do NOT Smoke (Tobacco/Vaping) 24 hours  prior to your procedure If you use a CPAP at night, you may bring all equipment for your overnight stay.   Contacts, glasses, dentures or bridgework may not be worn into surgery.      For patients admitted to the hospital, discharge time will be determined by your treatment team.   Patients discharged the day of surgery will not be allowed to drive home, and someone needs to stay with them for 24 hours.    Special instructions:   [place]- Preparing For Surgery  Before surgery, you can play an important role. Because skin is not sterile, your skin needs to be as free of germs as possible. You can reduce the number of germs on your skin by washing with CHG (chlorahexidine gluconate) Soap before surgery.  CHG is an antiseptic cleaner which kills germs and bonds with the skin to continue killing germs even after washing.    Oral Hygiene is also important to reduce your risk of infection.  Remember - BRUSH YOUR TEETH THE MORNING OF SURGERY WITH YOUR REGULAR TOOTHPASTE  Please do not use if you have an allergy to CHG or antibacterial soaps. If your skin becomes reddened/irritated stop using the CHG.  Do not shave (including legs and underarms) for  at least 48 hours prior to first CHG shower. It is OK to shave your face.  Please follow these instructions carefully.   Shower the NIGHT BEFORE SURGERY and the MORNING OF SURGERY with DIAL Soap.   Pat yourself dry with a CLEAN TOWEL.  Wear CLEAN PAJAMAS to bed the night before surgery  Place CLEAN SHEETS on your bed the night of your first shower and DO NOT SLEEP WITH PETS.   Day of Surgery: Please shower morning of surgery  Wear Clean/Comfortable clothing the morning of surgery Do not apply any deodorants/lotions.   Remember to brush your teeth WITH YOUR REGULAR TOOTHPASTE.   Questions were answered. Patient verbalized understanding of instructions.

## 2024-02-14 NOTE — Anesthesia Preprocedure Evaluation (Signed)
 Anesthesia Evaluation  Patient identified by MRN, date of birth, ID band Patient awake    Reviewed: Allergy & Precautions, NPO status , Patient's Chart, lab work & pertinent test results  History of Anesthesia Complications Negative for: history of anesthetic complications  Airway Mallampati: III  TM Distance: >3 FB Neck ROM: Full    Dental no notable dental hx. (+) Dental Advisory Given, Teeth Intact   Pulmonary sleep apnea , neg COPD, Patient abstained from smoking.Not current smoker   Pulmonary exam normal breath sounds clear to auscultation       Cardiovascular Exercise Tolerance: Good METShypertension, Pt. on medications and Pt. on home beta blockers + angina  + CAD and + DVT  (-) Past MI (-) dysrhythmias + Valvular Problems/Murmurs  Rhythm:Regular Rate:Normal - Systolic murmurs    Neuro/Psych  Headaches Myasthenia gravis  Neuromuscular disease  negative psych ROS   GI/Hepatic Neg liver ROS,GERD  Medicated,,  Endo/Other  negative endocrine ROSneg diabetes    Renal/GU Renal disease     Musculoskeletal   Abdominal   Peds  Hematology negative hematology ROS (+)   Anesthesia Other Findings 78-year-old male follows with cardiology for history of orthostatic hypotension/dizziness, HLD, HTN, OSA on CPAP, recurrent DVT on Eliquis, CAD s/p CABG x 4 LIMA-LAD, SVG-PDA, SVG-OM, SVG-D2 Y graft off OM) 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 Echocardiogram on 04/30/21 showed EF 50 to 55%, mildly reduced RV function, normal functioning bioprosthetic aortic valve. Zio patch x3 days on 08/24/2021 showed rare PVCs (less than 1% of beats).  Cardiac clearance per telephone encounter 01/02/2024 by Josefa Beauvais, NP, Chart reviewed as part of pre-operative protocol coverage. Given past medical history and time since last visit, based on ACC/AHA guidelines, LEMARIO CHAIKIN would be at acceptable risk for the planned procedure without further  cardiovascular testing. Per office protocol, patient can hold Eliquis for 2 days prior to procedure.  Patient will not need bridging with Lovenox  (enoxaparin ) around procedure.   Follows with neurology for history of myasthenia gravis, maintained on Mestinon  60 mg 2-3 times per day.   History of GERD on PPI.   Preop labs reviewed, creatinine mildly elevated 1.36, otherwise unremarkable.   EKG 11/13/2023: Sinus rhythm with occasional Premature ventricular complexes.  Rate 67. Nonspecific T wave abnormality   TTE 04/30/21: 1. Left ventricular ejection fraction, by estimation, is 50 to 55%. The  left ventricle has low normal function. The left ventricle has no regional  wall motion abnormalities. There is mild asymmetric left ventricular  hypertrophy of the basal-septal  segment. Left ventricular diastolic parameters are consistent with Grade I  diastolic dysfunction (impaired relaxation).   2. Right ventricular systolic function is mildly reduced. The right  ventricular size is normal. There is normal pulmonary artery systolic  pressure. The estimated right ventricular systolic pressure is 25.3 mmHg.   3. Left atrial size was mildly dilated.   4. The mitral valve is degenerative. Trivial mitral valve regurgitation.  Moderate mitral annular calcification.   5. There is a 25 mm InspirEase Resilia bioprosthetic valve present in the  aortic position. Valve appears well seated with normal function by color  doppler. Mean gradient , Vmax 1.29m/s, DI 0.65. No aortic  regurgitation.   6. The inferior vena cava is normal in size with greater than 50%  respiratory variability, suggesting right atrial pressure of 3 mmHg.   Comparison(s): Compared to prior TTE on 10/14/20, the patient is now s/p  AVR. EF now ~55% (previously 60-65%).    Reproductive/Obstetrics  Lab Results  Component Value Date   WBC 7.8 11/13/2023   HGB 13.2 11/13/2023    HCT 40.1 11/13/2023   MCV 95 11/13/2023   PLT 212 11/13/2023   Lab Results  Component Value Date   CREATININE 1.36 (H) 11/13/2023   BUN 26 11/13/2023   NA 140 11/13/2023   K 4.3 11/13/2023   CL 105 11/13/2023   CO2 21 11/13/2023    Anesthesia Physical Anesthesia Plan  ASA: 3  Anesthesia Plan: General   Post-op Pain Management: Minimal or no pain anticipated   Induction: Intravenous  PONV Risk Score and Plan: 2 and Treatment may vary due to age or medical condition, Ondansetron  and Dexamethasone  Airway Management Planned: LMA  Additional Equipment: None  Intra-op Plan:   Post-operative Plan: Extubation in OR  Informed Consent: I have reviewed the patients History and Physical, chart, labs and discussed the procedure including the risks, benefits and alternatives for the proposed anesthesia with the patient or authorized representative who has indicated his/her understanding and acceptance.     Dental advisory given  Plan Discussed with: CRNA  Anesthesia Plan Comments: (Discussed risks of anesthesia with patient, including PONV, sore throat, lip/dental/eye damage. Rare risks discussed as well, such as cardiorespiratory and neurological sequelae, and allergic reactions. Discussed the role of CRNA in patient's perioperative care. Patient understands.)         Anesthesia Quick Evaluation

## 2024-02-15 ENCOUNTER — Ambulatory Visit (HOSPITAL_COMMUNITY): Payer: Self-pay | Admitting: Physician Assistant

## 2024-02-15 ENCOUNTER — Ambulatory Visit (HOSPITAL_COMMUNITY)
Admission: RE | Admit: 2024-02-15 | Discharge: 2024-02-15 | Disposition: A | Discharge status: Home / Self Care | Attending: Orthopedic Surgery | Admitting: Orthopedic Surgery

## 2024-02-15 ENCOUNTER — Encounter (HOSPITAL_COMMUNITY): Payer: Self-pay | Admitting: Orthopedic Surgery

## 2024-02-15 ENCOUNTER — Encounter (HOSPITAL_COMMUNITY)
Admission: RE | Disposition: A | Discharge status: Home / Self Care | Payer: Self-pay | Source: Home / Self Care | Attending: Orthopedic Surgery

## 2024-02-15 DIAGNOSIS — I129 Hypertensive chronic kidney disease with stage 1 through stage 4 chronic kidney disease, or unspecified chronic kidney disease: Secondary | ICD-10-CM | POA: Insufficient documentation

## 2024-02-15 DIAGNOSIS — I10 Essential (primary) hypertension: Secondary | ICD-10-CM | POA: Diagnosis not present

## 2024-02-15 DIAGNOSIS — K219 Gastro-esophageal reflux disease without esophagitis: Secondary | ICD-10-CM | POA: Diagnosis not present

## 2024-02-15 DIAGNOSIS — Z79899 Other long term (current) drug therapy: Secondary | ICD-10-CM | POA: Diagnosis not present

## 2024-02-15 DIAGNOSIS — E785 Hyperlipidemia, unspecified: Secondary | ICD-10-CM

## 2024-02-15 DIAGNOSIS — N189 Chronic kidney disease, unspecified: Secondary | ICD-10-CM | POA: Insufficient documentation

## 2024-02-15 DIAGNOSIS — R519 Headache, unspecified: Secondary | ICD-10-CM | POA: Insufficient documentation

## 2024-02-15 DIAGNOSIS — E7800 Pure hypercholesterolemia, unspecified: Secondary | ICD-10-CM | POA: Diagnosis not present

## 2024-02-15 DIAGNOSIS — Z86718 Personal history of other venous thrombosis and embolism: Secondary | ICD-10-CM | POA: Diagnosis not present

## 2024-02-15 DIAGNOSIS — I2510 Atherosclerotic heart disease of native coronary artery without angina pectoris: Secondary | ICD-10-CM

## 2024-02-15 DIAGNOSIS — Z953 Presence of xenogenic heart valve: Secondary | ICD-10-CM | POA: Insufficient documentation

## 2024-02-15 DIAGNOSIS — I25119 Atherosclerotic heart disease of native coronary artery with unspecified angina pectoris: Secondary | ICD-10-CM | POA: Diagnosis not present

## 2024-02-15 DIAGNOSIS — M65332 Trigger finger, left middle finger: Secondary | ICD-10-CM

## 2024-02-15 DIAGNOSIS — M65322 Trigger finger, left index finger: Secondary | ICD-10-CM | POA: Insufficient documentation

## 2024-02-15 DIAGNOSIS — Z951 Presence of aortocoronary bypass graft: Secondary | ICD-10-CM | POA: Insufficient documentation

## 2024-02-15 DIAGNOSIS — Z7901 Long term (current) use of anticoagulants: Secondary | ICD-10-CM | POA: Insufficient documentation

## 2024-02-15 DIAGNOSIS — G7000 Myasthenia gravis without (acute) exacerbation: Secondary | ICD-10-CM | POA: Insufficient documentation

## 2024-02-15 DIAGNOSIS — G4733 Obstructive sleep apnea (adult) (pediatric): Secondary | ICD-10-CM | POA: Insufficient documentation

## 2024-02-15 HISTORY — PX: TRIGGER FINGER RELEASE: SHX641

## 2024-02-15 SURGERY — RELEASE, A1 PULLEY, FOR TRIGGER FINGER
Anesthesia: General | Site: Finger | Laterality: Left

## 2024-02-15 MED ORDER — 0.9 % SODIUM CHLORIDE (POUR BTL) OPTIME
TOPICAL | Status: DC | PRN
  Administered 2024-02-15: 1000 mL

## 2024-02-15 MED ORDER — DROPERIDOL 2.5 MG/ML IJ SOLN: 0.6250 mg | Freq: Once | INTRAMUSCULAR | Status: DC | PRN

## 2024-02-15 MED ORDER — FENTANYL CITRATE (PF) 100 MCG/2ML IJ SOLN
INTRAMUSCULAR | Status: AC
  Filled 2024-02-15: qty 2

## 2024-02-15 MED ORDER — FENTANYL CITRATE (PF) 250 MCG/5ML IJ SOLN
INTRAMUSCULAR | Status: DC | PRN
  Administered 2024-02-15: 50 ug via INTRAVENOUS

## 2024-02-15 MED ORDER — LIDOCAINE 2% (20 MG/ML) 5 ML SYRINGE
INTRAMUSCULAR | Status: DC | PRN
  Administered 2024-02-15: 100 mg via INTRAVENOUS

## 2024-02-15 MED ORDER — FENTANYL CITRATE (PF) 100 MCG/2ML IJ SOLN: 25.0000 ug | INTRAMUSCULAR | Status: DC | PRN

## 2024-02-15 MED ORDER — CEFAZOLIN SODIUM-DEXTROSE 2-4 GM/100ML-% IV SOLN
2.0000 g | INTRAVENOUS | Status: AC
  Administered 2024-02-15: 2 g via INTRAVENOUS
  Filled 2024-02-15: qty 100

## 2024-02-15 MED ORDER — DEXMEDETOMIDINE HCL IN NACL 80 MCG/20ML IV SOLN
INTRAVENOUS | Status: DC | PRN
  Administered 2024-02-15: 8 ug via INTRAVENOUS

## 2024-02-15 MED ORDER — PROPOFOL 10 MG/ML IV BOLUS
INTRAVENOUS | Status: DC | PRN
  Administered 2024-02-15: 110 mg via INTRAVENOUS

## 2024-02-15 MED ORDER — ONDANSETRON HCL 4 MG/2ML IJ SOLN
INTRAMUSCULAR | Status: DC | PRN
Start: 2024-02-15 — End: 2024-02-15
  Administered 2024-02-15: 4 mg via INTRAVENOUS

## 2024-02-15 MED ORDER — EPHEDRINE 5 MG/ML INJ
INTRAVENOUS | Status: AC
  Filled 2024-02-15: qty 5

## 2024-02-15 MED ORDER — ONDANSETRON HCL 4 MG/2ML IJ SOLN
INTRAMUSCULAR | Status: AC
Start: 2024-02-15 — End: 2024-02-15
  Filled 2024-02-15: qty 2

## 2024-02-15 MED ORDER — DEXAMETHASONE SOD PHOSPHATE PF 10 MG/ML IJ SOLN
INTRAMUSCULAR | Status: DC | PRN
Start: 2024-02-15 — End: 2024-02-15
  Administered 2024-02-15: 5 mg via INTRAVENOUS

## 2024-02-15 MED ORDER — LIDOCAINE 2% (20 MG/ML) 5 ML SYRINGE
INTRAMUSCULAR | Status: AC
Start: 2024-02-15 — End: 2024-02-15
  Filled 2024-02-15: qty 5

## 2024-02-15 MED ORDER — PROPOFOL 10 MG/ML IV BOLUS
INTRAVENOUS | Status: AC
  Filled 2024-02-15: qty 20

## 2024-02-15 MED ORDER — OXYCODONE HCL 5 MG PO TABS: 5.0000 mg | ORAL_TABLET | Freq: Once | ORAL | Status: DC | PRN

## 2024-02-15 MED ORDER — ACETAMINOPHEN 10 MG/ML IV SOLN: 1000.0000 mg | Freq: Once | INTRAVENOUS | Status: DC | PRN

## 2024-02-15 MED ORDER — EPHEDRINE SULFATE-NACL 50-0.9 MG/10ML-% IV SOSY
PREFILLED_SYRINGE | INTRAVENOUS | Status: DC | PRN
  Administered 2024-02-15: 10 mg via INTRAVENOUS
  Administered 2024-02-15: 5 mg via INTRAVENOUS

## 2024-02-15 MED ORDER — OXYCODONE HCL 5 MG/5ML PO SOLN: 5.0000 mg | Freq: Once | ORAL | Status: DC | PRN

## 2024-02-15 MED ORDER — ORAL CARE MOUTH RINSE: 15.0000 mL | Freq: Once | OROMUCOSAL | Status: AC

## 2024-02-15 MED ORDER — BUPIVACAINE HCL (PF) 0.25 % IJ SOLN
INTRAMUSCULAR | Status: DC | PRN
  Administered 2024-02-15: 9 mL

## 2024-02-15 MED ORDER — LACTATED RINGERS IV SOLN: INTRAVENOUS | Status: DC

## 2024-02-15 MED ORDER — CHLORHEXIDINE GLUCONATE 0.12 % MT SOLN
15.0000 mL | Freq: Once | OROMUCOSAL | Status: AC
  Administered 2024-02-15: 15 mL via OROMUCOSAL
  Filled 2024-02-15: qty 15

## 2024-02-15 SURGICAL SUPPLY — 23 items
BNDG COHESIVE 1X5 TAN STRL LF (GAUZE/BANDAGES/DRESSINGS) IMPLANT
BNDG COHESIVE 3X5 TAN ST LF (GAUZE/BANDAGES/DRESSINGS) ×1 IMPLANT
BNDG COMPR ESMARK 4X3 LF (GAUZE/BANDAGES/DRESSINGS) ×1 IMPLANT
CHLORAPREP W/TINT 26 (MISCELLANEOUS) ×1 IMPLANT
CORD BIPOLAR FORCEPS 12FT (ELECTRODE) IMPLANT
CUFF TOURN SGL QUICK 18X4 (TOURNIQUET CUFF) ×1 IMPLANT
DRAPE SURG 17X23 STRL (DRAPES) ×1 IMPLANT
FACESHIELD WRAPAROUND OR TEAM (MASK) ×1 IMPLANT
GAUZE SPONGE 4X4 12PLY STRL (GAUZE/BANDAGES/DRESSINGS) ×1 IMPLANT
GAUZE XEROFORM 1X8 LF (GAUZE/BANDAGES/DRESSINGS) ×1 IMPLANT
GLOVE SRG 8 PF TXTR STRL LF DI (GLOVE) ×1 IMPLANT
GLOVE SURG ENC MOIS LTX SZ7.5 (GLOVE) ×1 IMPLANT
GOWN STRL REUS W/ TWL LRG LVL3 (GOWN DISPOSABLE) ×1 IMPLANT
GOWN STRL REUS W/ TWL XL LVL3 (GOWN DISPOSABLE) ×1 IMPLANT
KIT BASIN OR (CUSTOM PROCEDURE TRAY) ×1 IMPLANT
NDL HYPO 25X1 1.5 SAFETY (NEEDLE) ×1 IMPLANT
NEEDLE HYPO 25X1 1.5 SAFETY (NEEDLE) ×1 IMPLANT
PACK ORTHO EXTREMITY (CUSTOM PROCEDURE TRAY) ×1 IMPLANT
SOLN 0.9% NACL POUR BTL 1000ML (IV SOLUTION) ×1 IMPLANT
SUT ETHILON 4 0 PS 2 18 (SUTURE) ×1 IMPLANT
SYR CONTROL 10ML LL (SYRINGE) ×1 IMPLANT
TOWEL GREEN STERILE FF (TOWEL DISPOSABLE) ×1 IMPLANT
UNDERPAD 30X36 HEAVY ABSORB (UNDERPADS AND DIAPERS) ×1 IMPLANT

## 2024-02-15 NOTE — Progress Notes (Signed)
 No labs needed per Dr. Boone.

## 2024-02-15 NOTE — Anesthesia Postprocedure Evaluation (Signed)
 Anesthesia Post Note  Patient: Edward [name]  Procedure(s) Performed: RELEASE, A1 PULLEY, FOR  LEFT TRIGGER AND LONG FINGERS (Left: Finger)     Patient location during evaluation: PACU Anesthesia Type: General Level of consciousness: awake and alert Pain management: pain level controlled Vital Signs Assessment: post-procedure vital signs reviewed and stable Respiratory status: spontaneous breathing, nonlabored ventilation, respiratory function stable and patient connected to nasal cannula oxygen Cardiovascular status: blood pressure returned to baseline and stable Postop Assessment: no apparent nausea or vomiting Anesthetic complications: no   No notable events documented.  Last Vitals:  Vitals:   02/15/24 1136 02/15/24 1145  BP: 133/65 130/71  Pulse: 76 72  Resp: 15 15  Temp: 36.6 C   SpO2: 97% 92%    Last Pain:  Vitals:   02/15/24 1145  TempSrc:   PainSc: 0-No pain                 Rome Ade

## 2024-02-15 NOTE — Discharge Instructions (Signed)

## 2024-02-15 NOTE — Anesthesia Procedure Notes (Signed)
 Procedure Name: LMA Insertion Date/Time: 02/15/2024 10:59 AM  Performed by: Mekaylah Klich J, CRNAPre-anesthesia Checklist: Patient identified, Emergency Drugs available, Suction available and Patient being monitored Patient Re-evaluated:Patient Re-evaluated prior to induction Oxygen Delivery Method: Circle System Utilized Preoxygenation: Pre-oxygenation with 100% oxygen Induction Type: IV induction Ventilation: Mask ventilation without difficulty LMA: LMA inserted LMA Size: 5.0 Number of attempts: 1 Airway Equipment and Method: Bite block Placement Confirmation: positive ETCO2 Tube secured with: Tape Dental Injury: Teeth and Oropharynx as per pre-operative assessment

## 2024-02-15 NOTE — Transfer of Care (Signed)
 Immediate Anesthesia Transfer of Care Note  Patient: Edward [name]  Procedure(s) Performed: RELEASE, A1 PULLEY, FOR  LEFT TRIGGER AND LONG FINGERS (Left: Finger)  Patient Location: PACU  Anesthesia Type:General  Level of Consciousness: drowsy  Airway & Oxygen Therapy: Patient Spontanous Breathing and Patient connected to face mask oxygen  Post-op Assessment: Report given to RN and Post -op Vital signs reviewed and stable  Post vital signs: Reviewed and stable  Last Vitals:  Vitals Value Taken Time  BP 133/65 02/15/24 11:36  Temp    Pulse 73 02/15/24 11:38  Resp 12 02/15/24 11:38  SpO2 99 % 02/15/24 11:38  Vitals shown include unfiled device data.  Last Pain:  Vitals:   02/15/24 0821  TempSrc:   PainSc: 3          Complications: No notable events documented.

## 2024-02-15 NOTE — H&P (Signed)
 Edward [name] is an 78 y.o. male.   Chief Complaint: trigger digits HPI: 78 yo male with left index and long finger trigger digits.  These are bothersome to him.  These have been injected previously without lasting relief.  He wishes to proceed with surgical trigger release.  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 by echo 07/2017   Chronic kidney disease    kidney stones   Colon polyps    Coronary artery disease 2022   Diplopia 10/15/2015   Diverticulosis    DVT (deep venous thrombosis) (HCC)    2018, 2019   Erectile dysfunction    GERD (gastroesophageal reflux disease)    with Barrett's esophagus   Gout    Hearing loss    hearing aides   Heart murmur    pt says he has had it since he was a child, no issues ever mentioned   HTN (hypertension)    Hypercholesteremia    Migraine headache    Myasthenia gravis (HCC)    Obesity    OSA (obstructive sleep apnea)    AHI 43/hr now on CPAP at 10cm H2O   Peripheral neuropathy 01/14/2019   feet   Right foot drop 12/03/2018    Past Surgical History:  Procedure Laterality Date   AORTIC VALVE REPLACEMENT N/A 03/03/2021   Procedure: AORTIC VALVE REPLACEMENT (AVR) WITH BIOPROSTHETIC VALVE, Inpiris Resila Aortic Valve 25mm;  Surgeon: Shyrl Linnie KIDD, MD;  Location: [facility] OR;  Service: Open Heart Surgery;  Laterality: N/A;   CORONARY ARTERY BYPASS GRAFT N/A 03/03/2021   Procedure: CORONARY ARTERY BYPASS GRAFTING (CABG) x four on pump, using left internal mammary artery, left and right endoscopic greater saphenous veins conduits;  Surgeon: Shyrl Linnie KIDD, MD;  Location: [facility] OR;  Service: Open Heart Surgery;  Laterality: N/A;   ENDOVEIN HARVEST OF GREATER SAPHENOUS VEIN Bilateral 03/03/2021   Procedure: ENDOVEIN HARVEST OF GREATER SAPHENOUS VEIN;  Surgeon: Shyrl Linnie KIDD, MD;  Location: [facility]  OR;  Service: Open Heart Surgery;  Laterality: Bilateral;   laser assisted uvuloplasty     MEDIASTINAL EXPLORATION N/A 03/03/2021   Procedure: MEDIASTINAL EXPLORATION;  Surgeon: Shyrl Linnie KIDD, MD;  Location: [facility] OR;  Service: Open Heart Surgery;  Laterality: N/A;   RIGHT/LEFT HEART CATH AND CORONARY ANGIOGRAPHY N/A 02/11/2021   Procedure: RIGHT/LEFT HEART CATH AND CORONARY ANGIOGRAPHY;  Surgeon: [place], Peter M, MD;  Location: [facility] INVASIVE CV LAB;  Service: Cardiovascular;  Laterality: N/A;   TEE WITHOUT CARDIOVERSION N/A 03/03/2021   Procedure: TRANSESOPHAGEAL ECHOCARDIOGRAM (TEE);  Surgeon: Shyrl Linnie KIDD, MD;  Location: [facility] OR;  Service: Open Heart Surgery;  Laterality: N/A;    Family History: Family History  Problem Relation Age of Onset   Hypertension Mother    Heart attack Father    Heart disease Father    Hypertension Sister    Heart disease Brother    Hypertension Brother    Hypertension Brother    Anemia Brother    Cancer Brother     Social History:   reports that he has never smoked. He has never used smokeless tobacco. He reports that he does not currently use alcohol. He reports that he does not use drugs.  Medications: Medications Prior to Admission  Medication Sig Dispense Refill   allopurinol (ZYLOPRIM) 300 MG tablet Take 300 mg by mouth in  the morning.     amLODipine  (NORVASC ) 5 MG tablet Take 1 tablet (5 mg total) by mouth daily. 90 tablet 3   Biotin 1000 MCG tablet Take 1,000 mcg by mouth in the morning.     cyanocobalamin  (VITAMIN B12) 1000 MCG tablet Take 1,000 mcg by mouth daily.     dicyclomine (BENTYL) 20 MG tablet Take 20 mg by mouth 4 (four) times daily.     Evolocumab  (REPATHA  SURECLICK) 140 MG/ML SOAJ INJECT 140 MG INTO THE SKIN EVERY 14 (FOURTEEN) DAYS. 6 mL 3   fish oil-omega-3 fatty acids 1000 MG capsule Take 1 g by mouth in the morning and at bedtime.     NON FORMULARY Pt uses a cpap nightly     pantoprazole  (PROTONIX ) 40 MG tablet Take 40  mg by mouth daily.     Polyethyl Glycol-Propyl Glycol (LUBRICANT EYE DROPS) 0.4-0.3 % SOLN Place 1-2 drops into both eyes 3 (three) times daily as needed (tired/dry/irritated eyes.).     pyridostigmine  (MESTINON ) 60 MG tablet TAKE 1 TABLET BY MOUTH 3 TIMES DAILY. (Patient taking differently: Take 60 mg by mouth in the morning and at bedtime.) 270 tablet 3   amoxicillin  (AMOXIL ) 500 MG capsule Take 2 Gms ( 4 capsules ) 1 hour before dental work (Patient not taking: Reported on 02/13/2024) 4 capsule 3   amoxicillin  (AMOXIL ) 500 MG tablet Prophylaxis for dental procedure. Take 2 grams (4x 500mg  tablets) 1 hour before procedure. 4 tablet 3   amoxicillin -clavulanate (AUGMENTIN ) 500-125 MG tablet Take 1 tablet by mouth every 8 (eight) hours. (Patient not taking: Reported on 02/13/2024) 21 tablet 0   ELIQUIS 5 MG TABS tablet Take 5 mg by mouth 2 (two) times daily.     HYDROcodone -acetaminophen  (NORCO) 5-325 MG tablet Take 2 tablets by mouth every 4 (four) hours as needed. 10 tablet 0    No results found for this or any previous visit (from the past 48 hours).  No results found.    Blood pressure (!) 146/88, pulse 67, temperature 98 F (36.7 C), temperature source Oral, resp. rate 17, height 5' 9 (1.753 m), weight 81.6 kg, SpO2 96%.  General appearance: alert, cooperative, and appears stated age Head: Normocephalic, without obvious abnormality, atraumatic Neck: supple, symmetrical, trachea midline Extremities: Intact sensation and capillary refill all digits.  +epl/fpl/io.  No wounds.  Skin: Skin color, texture, turgor normal. No rashes or lesions Neurologic: Grossly normal Incision/Wound: none  Assessment/Plan Left index and long finger trigger digits.  Non operative and operative treatment options have been discussed with the patient and patient wishes to proceed with operative treatment. Risks, benefits, and alternatives of surgery have been discussed and the patient agrees with the plan of  care.   [name] 02/15/2024, 8:21 AM

## 2024-02-15 NOTE — Op Note (Signed)
 02/15/2024 [facility] OR  Operative Note  PREOPERATIVE DIAGNOSIS: LEFT INDEX FINGER AND LEFT LONG FINGER TRIGGER RELEASES  POSTOPERATIVE DIAGNOSIS:  LEFT INDEX FINGER AND LEFT LONG FINGER TRIGGER RELEASES  PROCEDURE: Procedure(s): Left index finger trigger release Left long finger trigger release  SURGEON:  Kamylah Manzo, MD  ASSISTANT:  none.  ANESTHESIA:  General.  IV FLUIDS:  Per anesthesia flow sheet.  ESTIMATED BLOOD LOSS:  Minimal.  COMPLICATIONS:  None.  SPECIMENS:  None.  TOURNIQUET TIME:  Total Tourniquet Time Documented: Forearm (Left) - 12 minutes Total: Forearm (Left) - 12 minutes   DISPOSITION:  Stable to PACU.  LOCATION: [facility] OR  INDICATIONS: Edward [name] is a 78 y.o. male with triggering of the index finger, long finger.  This has been injected without lasting resolution.  He wishes to proceed with surgical trigger release.  Risks, benefits and alternatives of surgery were discussed including the risk of blood loss, infection, damage to nerves, vessels, tendons, ligaments, bone, failure of surgery, need for additional surgery, complications with wound healing, continued pain, continued triggering and need for repeat surgery.  He voiced understanding of these risks and elected to proceed.  OPERATIVE COURSE:  After being identified preoperatively by myself, the patient and I agreed upon the procedure and site of procedure.  The surgical site was marked. Surgical consent had been signed. He was given IV Ancef  as preoperative antibiotic prophylaxis. He was transported to the operating room and placed on the operating room table in supine position with the Left upper extremity on an arm board. General anesthesia was induced by the anesthesiologist.  The Left upper extremity was prepped and draped in normal sterile orthopedic fashion. A surgical pause was performed between surgeons, anesthesia, and operating room staff, and all were in agreement as to the patient, procedure, and  site of procedure.  Tourniquet at the proximal aspect of the extremity was inflated to 250 mmHg after exsanguination of the arm with an Esmarch bandage.  An incision was made at the volar aspect of the MP joint of the index finger.  This was carried into the subcutaneous tissues by spreading technique.  Bipolar electrocautery was used to obtain hemostasis.  The radial and ulnar digital nerves were protected throughout the case. The flexor sheath was identified.  The A1 pulley was identified and sharply incised.  It was released in its entirety.  The proximal 1-2 mm of the A2 pulley was vented to allow better excursion of the tendons.  The finger was placed through a range of motion and there was noted to be no catching.  The tendons were brought through the wound and any adherences released.  An incision was made at the volar aspect of the MP joint of the long finger.  This was carried into the subcutaneous tissues by spreading technique.  Bipolar electrocautery was used to obtain hemostasis.  The radial and ulnar digital nerves were protected throughout the case. The flexor sheath was identified.  The A1 pulley was identified and sharply incised.  It was released in its entirety.  The proximal 1-2 mm of the A2 pulley was vented to allow better excursion of the tendons.  The finger was placed through a range of motion and there was noted to be no catching.  The tendons were brought through the wound and any adherences released.  The wounds were then copiously irrigated with sterile saline. They were closed with 4-0 nylon in a horizontal mattress fashion.  They were injected with 0.25% plain  Marcaine to aid in postoperative analgesia.  They were dressed with sterile Xeroform, 4x4s, and wrapped lightly with a Coban dressing.  Tourniquet was deflated at 12 minutes.  The fingertips were pink with brisk capillary refill after deflation of the tourniquet.  The operative drapes were broken down and the patient was awoken  from anesthesia safely.  He was transferred back to the stretcher and taken to the PACU in stable condition.   I will see him back in the office in 1 week for postoperative followup.  I will give him a prescription for Norco 5/325 1 tab PO q6 hours prn pain, dispense #15.    Edward Caskey, MD Electronically signed, 02/15/24

## 2024-02-16 ENCOUNTER — Encounter (HOSPITAL_COMMUNITY): Payer: Self-pay | Admitting: Orthopedic Surgery

## 2024-02-19 DIAGNOSIS — G4733 Obstructive sleep apnea (adult) (pediatric): Secondary | ICD-10-CM | POA: Diagnosis not present

## 2024-02-27 NOTE — Progress Notes (Unsigned)
 PATIENT: Edward [name] DOB: Sep 22, 1945  REASON FOR VISIT: Follow up ocular MG, peripheral neuropathy, bilateral foot drop HISTORY FROM: Patient, wife PRIMARY NEUROLOGIST: Dr. Margaret   ASSESSMENT AND PLAN 78 y.o. year old male   1.  Myasthenia gravis 2.  Peripheral neuropathy 3.  Nocturnal leg cramps - Worsening nocturnal leg cramps, MG stable, try eliminating PM dose of Mestinon  30 mg to see if benefit, monitor for worsening symptoms - Continue Mestinon  60 mg up to 3 times daily as needed (right now 60/30/30) - Check labs today for cramps  - Known lumbar spine issues, continue follow up with Dr. Malcolm   4.  Cognitive impairment, memory loss - MOCA 17/30, hearing issues likely contributing  - Check MRI of the brain - Check labs - Patient well know to me, can see a difference in his memory, relies on his wife more - Follow up in 4-6 months with Dr. Margaret   Orders Placed This Encounter  Procedures   MR BRAIN WO CONTRAST   Basic Metabolic Panel   CK total and CKMB (cardiac)not at [facility]   Magnesium    RPR   Vitamin B12   TSH   Sedimentation rate   HISTORY OF PRESENT ILLNESS: Today 02/28/24 02/28/24 SS: here with his wife. Not hearing well today. Mentions concern nocturnal leg cramps, getting worse, both legs, keeps him up at night, tried mustard, it helps. Was taken off Lipitor , cramps got better, now on Repatha . Had MRI Lumbar spine with Dr. Malcolm, nothing surgical. Takes Mestinon  60 mg AM/30 mg midday, 30 mg PM. Generally overtime, feels legs are getting weaker. Denies diplopia, ptosis. Works in the yard. Wife concerned about memory, repeats things, asks same questions. Going on for at least year. More trouble with banking online. Hearing does play a part, seeing audiology in Dec. Has been taking old prescription for trazodone  for sleep 50 mg, taking 1/2. Uses CPAP nightly. MOCA 17/30.  02/23/23 SS: Taking Mestinon  lower dosing, was taking 60 mg TID, then reduced Mestinon   down to 1/2 tablet TID, due to diarrhea. Went to see GI, doesn't think directly related to Mestinon  has been on long term. Feels MG is doing okay, MG in the past resulted in ptosis to both eyes. Feels his left leg is getting weaker over time. Has seen Dr. Malcolm in the past for low back pain, not a lot of back pain at the time. Works in the yard very often. Bilateral foot drop.   07/30/22 SS: Saw Dr. Margaret and last visit, prednisone  reduced to 5 mg daily for 1 month then 5 mg every other day, then he stopped it.  He had called in December with some worsening MG symptoms, Mestinon  was increased 60 mg 2-3 times a day. Currently taking 60 mg BID, 30 mg midday. MG is doing well. He has some diarrhea, but this is chronic. He has cataracts, no ptosis or diplopia. He has bilateral foot drop. He does stumble and fall. He has lighter shoes that's helps. No back pain, was seeing Dr. Malcolm in the past for back pain. He has some general weakness, not any worse since stopping prednisone . He works all the time in the yard, staying active.   UPDATE (01/24/22, VRP): Since last visit, doing well. Mild vision issues. Symptoms are stable. Sleep is poor. Some memory issues.   Update 07/13/21 SS: Edward [name] here today for follow-up. He had reduced prednisone  from 6 mg to 5 mg after last visit in September, did okay.  A few weeks he increased it back up to 6 mg due to weakness, a lot of exertion with cardiac rehab. Has helped. Is in cardiac rehab post CABG, valve replacement back in November, had symptoms of heart burn. Not taking trazodone , felt groggy the next day, uses CPAP at night. MG symptoms doing well, have had minimal problems with ocular symptoms. No significant weakness of arms of legs. Doing well cardiac rehab, meeting his goals, this is 5th week. No falls, has cane just in case sometimes. No trouble swallowing or chewing. Does have some trouble with short term memory, remembering names, wife thinks poor hearing plays big  role. With heat, he has worse MG symptoms.  HISTORY  01/06/2021 Dr. Jenel: Mr. Menken is a 78 year old right-handed white male with a history of myasthenia gravis primarily with ocular features.  The patient has a significant peripheral neuropathy associated with bilateral foot drops and some gait instability.  Within the last 6 months he has reported some heartburn type symptoms but he also he has had some right shoulder and neck discomfort with pain down the right arm.  He has seen Dr. Malcolm from neurosurgery, MRI of the cervical spine was done recently, I do not have the report of this.  The patient has had some medication alterations through cardiology.  He currently is on prednisone  6 mg daily and he takes Mestinon  60 mg, 1/2 tablet 3 times daily.  He is not sleeping well.  He reports some occasional dizziness when he stands up off and on.  The heat of the summer has worsened some of his myasthenic symptoms, he does have some generalized fatigue.  He reports occasional double vision and slight ptosis.  He reports no weakness of the arms or legs or difficulties with chewing or swallowing.  REVIEW OF SYSTEMS: Out of a complete 14 system review of symptoms, the patient complains only of the following symptoms, and all other reviewed systems are negative.  See HPI  ALLERGIES: No Known Allergies  HOME MEDICATIONS: Outpatient Medications Prior to Visit  Medication Sig Dispense Refill   allopurinol (ZYLOPRIM) 300 MG tablet Take 300 mg by mouth in the morning.     amLODipine  (NORVASC ) 5 MG tablet Take 1 tablet (5 mg total) by mouth daily. 90 tablet 3   Biotin 1000 MCG tablet Take 1,000 mcg by mouth in the morning.     cyanocobalamin  (VITAMIN B12) 1000 MCG tablet Take 1,000 mcg by mouth daily.     dicyclomine (BENTYL) 20 MG tablet Take 20 mg by mouth 4 (four) times daily.     ELIQUIS 5 MG TABS tablet Take 5 mg by mouth 2 (two) times daily.     Evolocumab  (REPATHA  SURECLICK) 140 MG/ML SOAJ INJECT 140  MG INTO THE SKIN EVERY 14 (FOURTEEN) DAYS. 6 mL 3   fish oil-omega-3 fatty acids 1000 MG capsule Take 1 g by mouth in the morning and at bedtime.     HYDROcodone -acetaminophen  (NORCO) 5-325 MG tablet Take 2 tablets by mouth every 4 (four) hours as needed. 10 tablet 0   NON FORMULARY Pt uses a cpap nightly     pantoprazole  (PROTONIX ) 40 MG tablet Take 40 mg by mouth daily.     Polyethyl Glycol-Propyl Glycol (LUBRICANT EYE DROPS) 0.4-0.3 % SOLN Place 1-2 drops into both eyes 3 (three) times daily as needed (tired/dry/irritated eyes.).     pyridostigmine  (MESTINON ) 60 MG tablet TAKE 1 TABLET BY MOUTH 3 TIMES DAILY. (Patient taking differently: Take by mouth. 1  tablet in the morning, 1/2 tablet in the afternoon, and 1/2 tablet at night) 270 tablet 3   amoxicillin  (AMOXIL ) 500 MG capsule Take 2 Gms ( 4 capsules ) 1 hour before dental work (Patient not taking: Reported on 02/28/2024) 4 capsule 3   amoxicillin  (AMOXIL ) 500 MG tablet Prophylaxis for dental procedure. Take 2 grams (4x 500mg  tablets) 1 hour before procedure. (Patient not taking: Reported on 02/28/2024) 4 tablet 3   amoxicillin -clavulanate (AUGMENTIN ) 500-125 MG tablet Take 1 tablet by mouth every 8 (eight) hours. (Patient not taking: Reported on 02/28/2024) 21 tablet 0   No facility-administered medications prior to visit.    PAST MEDICAL HISTORY: 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 by echo 07/2017   Chronic kidney disease    kidney stones   Colon polyps    Coronary artery disease 2022   Diplopia 10/15/2015   Diverticulosis    DVT (deep venous thrombosis) (HCC)    2018, 2019   Erectile dysfunction    GERD (gastroesophageal reflux disease)    with Barrett's esophagus   Gout    Hearing loss    hearing aides   Heart murmur    pt says he has had it since he was a child, no issues ever mentioned   HTN (hypertension)     Hypercholesteremia    Migraine headache    Myasthenia gravis (HCC)    Obesity    OSA (obstructive sleep apnea)    AHI 43/hr now on CPAP at 10cm H2O   Peripheral neuropathy 01/14/2019   feet   Right foot drop 12/03/2018    PAST SURGICAL HISTORY: Past Surgical History:  Procedure Laterality Date   AORTIC VALVE REPLACEMENT N/A 03/03/2021   Procedure: AORTIC VALVE REPLACEMENT (AVR) WITH BIOPROSTHETIC VALVE, Inpiris Resila Aortic Valve 25mm;  Surgeon: Shyrl Linnie KIDD, MD;  Location: [facility] OR;  Service: Open Heart Surgery;  Laterality: N/A;   CORONARY ARTERY BYPASS GRAFT N/A 03/03/2021   Procedure: CORONARY ARTERY BYPASS GRAFTING (CABG) x four on pump, using left internal mammary artery, left and right endoscopic greater saphenous veins conduits;  Surgeon: Shyrl Linnie KIDD, MD;  Location: [facility] OR;  Service: Open Heart Surgery;  Laterality: N/A;   ENDOVEIN HARVEST OF GREATER SAPHENOUS VEIN Bilateral 03/03/2021   Procedure: ENDOVEIN HARVEST OF GREATER SAPHENOUS VEIN;  Surgeon: Shyrl Linnie KIDD, MD;  Location: [facility] OR;  Service: Open Heart Surgery;  Laterality: Bilateral;   laser assisted uvuloplasty     MEDIASTINAL EXPLORATION N/A 03/03/2021   Procedure: MEDIASTINAL EXPLORATION;  Surgeon: Shyrl Linnie KIDD, MD;  Location: [facility] OR;  Service: Open Heart Surgery;  Laterality: N/A;   RIGHT/LEFT HEART CATH AND CORONARY ANGIOGRAPHY N/A 02/11/2021   Procedure: RIGHT/LEFT HEART CATH AND CORONARY ANGIOGRAPHY;  Surgeon: Jordan, Peter M, MD;  Location[facility] INVAS[facility] CV LAB;  Service: Cardiovascular;  Laterality: N/A;   TEE WITHOUT CARDIOVERSION N/A 03/03/2021   Procedure: TRANSESOPHAGEAL ECHOCARDIOGRAM (TEE);  Surgeon: Shyrl Linnie KIDD, MD;  Location: [facility] OR;  Service: Open Heart Surgery;  Laterality: N/A;   TRIGGER FINGER RELEASE Left 02/15/2024   Procedure: RELEASE, A1 PULLEY, FOR  LEFT TRIGGER AND LONG FINGERS;  Surgeon: Murrell Drivers, MD;  Location: [facility] OR;  Service: Orthopedics;  Laterality:  Left;  LEFT INDEX FINGER AND LEFT LONG FINGER TRIGGER RELEASES   trigger finger surgery  01/2024   left hand  FAMILY HISTORY: Family History  Problem Relation Age of Onset   Hypertension Mother    Heart attack Father    Heart disease Father    Hypertension Sister    Heart disease Brother    Hypertension Brother    Hypertension Brother    Anemia Brother    Cancer Brother     SOCIAL HISTORY: Social History   Socioeconomic History   Marital status: Married    Spouse name: Rock   Number of children: 2   Years of education: [employer] education level: Not on file  Occupational History   Occupation: Retired  Tobacco Use   Smoking status: Never   Smokeless tobacco: Never  Vaping Use   Vaping status: Never Used  Substance and Sexual Activity   Alcohol use: Not Currently   Drug use: No   Sexual activity: Not on file  Other Topics Concern   Not on file  Social History Narrative   Lives home w/ his wife   Right-handed    Drinks about 1 cup of caffeine per day   Social Drivers of Corporate Investment Banker Strain: Not on file  Food Insecurity: Low Risk  (08/15/2023)   Received from [facility]   Hunger Vital Sign    Within the past 12 months, you worried that your food would run out before you got money to buy more: Never true    Within the past 12 months, the food you bought just didn't last and you didn't have money to get more. : Never true  Transportation Needs: No Transportation Needs (08/15/2023)   Received from [facility]    In the past 12 months, has lack of reliable transportation kept you from medical appointments, meetings, work or from getting things needed for daily living? : No  Physical Activity: Not on file  Stress: Not on file  Social Connections: Not on file  Intimate Partner Violence: Not on file   PHYSICAL EXAM  Vitals:   02/28/24 0954  Weight: 187 lb (84.8 kg)  Height: 5' 8 (1.727 m)    Body mass index is  28.43 kg/m.    02/28/2024   10:40 AM  Montreal Cognitive Assessment   Visuospatial/ Executive (0/5) 2  Naming (0/3) 3  Attention: Read list of digits (0/2) 2  Attention: Read list of letters (0/1) 1  Attention: Serial 7 subtraction starting at 100 (0/3) 3  Language: Repeat phrase (0/2) 2  Language : Fluency (0/1) 0  Abstraction (0/2) 2  Delayed Recall (0/5) 0  Orientation (0/6) 2  Total 17  Adjusted Score (based on education) 17   Generalized: Well developed, in no acute distress, hard of hearing Neurological examination  Mentation: Alert oriented to time, place, relies on wife for history. Follows all commands speech and language fluent, repeats himself Cranial nerve II-XII: Pupils were equal round reactive to light. Extraocular movements were full, visual field were full on confrontational test. Facial sensation and strength were normal. Head turning and shoulder shrug were normal and symmetric.  Mild eye open weakness.  No ptosis or diplopia. Motor: Good strength all extremities, mild left hip flexion weakness, there is bilateral foot drop Sensory: Length dependent sensory deficit to soft touch to lower extremities Coordination: Cerebellar testing reveals good finger-nose-finger and heel-to-shin bilaterally.  Gait and station: Bilateral foot drop, gait is slightly wide based but steady, no assistive device Reflexes: Deep tendon reflexes are symmetric but are decreased   DIAGNOSTIC DATA (  LABS, IMAGING, TESTING) - I reviewed patient records, labs, notes, testing and imaging myself where available.  Lab Results  Component Value Date   WBC 7.8 11/13/2023   HGB 13.2 11/13/2023   HCT 40.1 11/13/2023   MCV 95 11/13/2023   PLT 212 11/13/2023      Component Value Date/Time   NA 140 11/13/2023 1646   K 4.3 11/13/2023 1646   CL 105 11/13/2023 1646   CO2 21 11/13/2023 1646   GLUCOSE 104 (H) 11/13/2023 1646   GLUCOSE 99 03/17/2023 1828   BUN 26 11/13/2023 1646   CREATININE 1.36  (H) 11/13/2023 1646   CALCIUM  9.1 11/13/2023 1646   PROT 6.7 11/13/2023 1646   ALBUMIN  4.1 11/13/2023 1646   AST 29 11/13/2023 1646   ALT 19 11/13/2023 1646   ALKPHOS 68 11/13/2023 1646   BILITOT 0.3 11/13/2023 1646   GFRNONAA >60 03/17/2023 1828   GFRAA 65 12/12/2019 0948   Lab Results  Component Value Date   CHOL 129 03/30/2023   HDL 42 03/30/2023   LDLCALC 65 03/30/2023   TRIG 123 03/30/2023   CHOLHDL 3.1 03/30/2023   Lab Results  Component Value Date   HGBA1C 6.2 (H) 03/01/2021   Lab Results  Component Value Date   VITAMINB12 432 01/14/2019   Lab Results  Component Value Date   TSH 2.380 11/13/2023   Lauraine Gayland MANDES, DNP  [facility] [address], [address] [place], [place] [address] [phone]

## 2024-02-28 ENCOUNTER — Ambulatory Visit (INDEPENDENT_AMBULATORY_CARE_PROVIDER_SITE_OTHER): Payer: Medicare PPO | Admitting: Neurology

## 2024-02-28 ENCOUNTER — Telehealth: Payer: Self-pay | Admitting: Neurology

## 2024-02-28 ENCOUNTER — Encounter: Payer: Self-pay | Admitting: Neurology

## 2024-02-28 VITALS — BP 120/64 | HR 68 | Ht 68.0 in | Wt 187.0 lb

## 2024-02-28 DIAGNOSIS — G7000 Myasthenia gravis without (acute) exacerbation: Secondary | ICD-10-CM

## 2024-02-28 DIAGNOSIS — G603 Idiopathic progressive neuropathy: Secondary | ICD-10-CM

## 2024-02-28 DIAGNOSIS — G4762 Sleep related leg cramps: Secondary | ICD-10-CM

## 2024-02-28 DIAGNOSIS — R413 Other amnesia: Secondary | ICD-10-CM | POA: Insufficient documentation

## 2024-02-28 NOTE — Patient Instructions (Addendum)
 Check labs today  Check MRI brain  Try stopping PM dose of Mestinon  to see if helps cramps Monitor for any worsening Myasthenia symptoms Follow up in 4-6 months with Dr. Margaret

## 2024-02-28 NOTE — Telephone Encounter (Signed)
 MRI order sent to [facility] to schedule. [phone]

## 2024-02-29 ENCOUNTER — Encounter: Payer: Self-pay | Admitting: Neurology

## 2024-02-29 ENCOUNTER — Ambulatory Visit: Payer: Self-pay | Admitting: Neurology

## 2024-02-29 LAB — TSH: TSH: 2.05 u[IU]/mL (ref 0.450–4.500)

## 2024-02-29 LAB — BASIC METABOLIC PANEL WITH GFR
BUN/Creatinine Ratio: 18 (ref 10–24)
BUN: 26 mg/dL (ref 8–27)
CO2: 19 mmol/L — ABNORMAL LOW (ref 20–29)
Calcium: 9.1 mg/dL (ref 8.6–10.2)
Chloride: 107 mmol/L — ABNORMAL HIGH (ref 96–106)
Creatinine, Ser: 1.45 mg/dL — ABNORMAL HIGH (ref 0.76–1.27)
Glucose: 104 mg/dL — ABNORMAL HIGH (ref 70–99)
Potassium: 4.5 mmol/L (ref 3.5–5.2)
Sodium: 142 mmol/L (ref 134–144)
eGFR: 50 mL/min/1.73 — ABNORMAL LOW (ref >59)

## 2024-02-29 LAB — MAGNESIUM: Magnesium: 2.3 mg/dL (ref 1.6–2.3)

## 2024-02-29 LAB — CK TOTAL AND CKMB (NOT AT ARMC)
CK-MB Index: 4.1 ng/mL (ref 0.0–10.4)
Total CK: 229 U/L (ref 41–331)

## 2024-02-29 LAB — RPR: RPR Ser Ql: NONREACTIVE

## 2024-02-29 LAB — SEDIMENTATION RATE: Sed Rate: 17 mm/h (ref 0–30)

## 2024-02-29 LAB — VITAMIN B12: Vitamin B-12: 724 pg/mL (ref 232–1245)

## 2024-03-07 ENCOUNTER — Ambulatory Visit
Admission: RE | Admit: 2024-03-07 | Discharge: 2024-03-07 | Disposition: A | Source: Ambulatory Visit | Attending: Neurology | Admitting: Neurology

## 2024-03-07 DIAGNOSIS — G4762 Sleep related leg cramps: Secondary | ICD-10-CM | POA: Diagnosis not present

## 2024-03-13 ENCOUNTER — Encounter: Payer: Self-pay | Admitting: Neurology

## 2024-03-13 DIAGNOSIS — R413 Other amnesia: Secondary | ICD-10-CM

## 2024-03-18 ENCOUNTER — Ambulatory Visit: Attending: Cardiology

## 2024-03-18 ENCOUNTER — Telehealth: Payer: Self-pay | Admitting: Cardiology

## 2024-03-18 DIAGNOSIS — R42 Dizziness and giddiness: Secondary | ICD-10-CM

## 2024-03-18 DIAGNOSIS — I700 Atherosclerosis of aorta: Secondary | ICD-10-CM

## 2024-03-18 MED ORDER — MEMANTINE HCL 5 MG PO TABS: 5.0000 mg | ORAL_TABLET | Freq: Two times a day (BID) | ORAL | 0 refills | Status: DC

## 2024-03-18 NOTE — Telephone Encounter (Signed)
 Advisement given to wife. Agreeable to plan.  Made aware cannot wear monitor while echocardiogram testing and to plan monitor around this.  Briefly went over monitor instructions, such as cannot wear if has other testing, no showering x24hr after placing it on. Aware office will be in touch for echo scheduling. Discussed importance of hydration (which wife thinks he may be somewhat dehydrated) and compression hose.

## 2024-03-18 NOTE — Telephone Encounter (Signed)
 Called and spoke to pt's wife, Edward [name]. She is concerned that the dizziness has not improved since they last saw Dr. Wendel in July 2025 (and  [place], NP about one week later). Pt had a very bad episode last night and this morning. His BP today was around 140/90, usually SBP 130's. She is asking if he should wear the heart monitor to help r/u cardiac related problems, before starting a new dementia medication.   She also shares that they got in touch with the Neurologist who informed that that the Dementia medications he is on (or will be starting soon) also can cause Dizziness. He also has Myasthenia Gravis.   He has not been sick (flu/cold like) recently. He may not be staying hydrated.  He also has some chest cramps but the providers think this may be musculoskeletal. Has 6 mo f/u with Dr. Kate on 05/24/24.

## 2024-03-18 NOTE — Progress Notes (Unsigned)
 Enrolled for [employer] to mail a ZIO XT long term holter monitor to the patients address on file.  ? ?Dr. Bjorn Pippin to read. ?

## 2024-03-18 NOTE — Telephone Encounter (Signed)
 STAT if patient feels like he/she is going to faint   Are you dizzy, lightheaded, or faint now? No, pt's wife on the line  Have you passed out? No IF YES MOVE TO .SYNCOPECVD  Do you have any other symptoms? No  Have you checked your HR and BP (record if available)? N/a  Patient's wife is calling due to the patient having dizziness. Patient's wife stated this is an ongoing issue and saw Dr. Wendel (DOD) for this issue on 11/13/23. Patient's wife stated they declined the heart monitor but are now reconsidering it. Please advise.

## 2024-03-18 NOTE — Telephone Encounter (Signed)
 If patient agreeable he may wear a 2-week non-live Zio for dizziness and would recommend checking an echocardiogram given history of AVR.  He needs to ensure that he is staying well-hydrated.  When seen by Dr. Wendel patient's losartan  was discontinued in setting of orthostatic hypotension, is his dizziness primarily with position changes (sitting to standing)? If yes would also recommend wearing of compression stockings and slow position changes.

## 2024-03-19 ENCOUNTER — Ambulatory Visit (HOSPITAL_COMMUNITY)
Admission: RE | Admit: 2024-03-19 | Discharge: 2024-03-19 | Disposition: A | Discharge status: Home / Self Care | Source: Ambulatory Visit | Attending: Internal Medicine | Admitting: Internal Medicine

## 2024-03-19 ENCOUNTER — Other Ambulatory Visit (INDEPENDENT_AMBULATORY_CARE_PROVIDER_SITE_OTHER): Payer: Self-pay

## 2024-03-19 ENCOUNTER — Other Ambulatory Visit: Payer: Self-pay

## 2024-03-19 DIAGNOSIS — I700 Atherosclerosis of aorta: Secondary | ICD-10-CM | POA: Insufficient documentation

## 2024-03-19 DIAGNOSIS — R413 Other amnesia: Secondary | ICD-10-CM | POA: Diagnosis not present

## 2024-03-19 DIAGNOSIS — Z0289 Encounter for other administrative examinations: Secondary | ICD-10-CM

## 2024-03-19 LAB — ECHOCARDIOGRAM COMPLETE
Area-P 1/2: 3.04 cm2
MV M vel: 4.9 m/s
MV Peak grad: 96 mmHg
S' Lateral: 2.55 cm

## 2024-03-20 ENCOUNTER — Ambulatory Visit: Payer: Self-pay | Admitting: Cardiology

## 2024-03-23 LAB — ATN PROFILE
A -- Beta-amyloid 42/40 Ratio: 0.104 (ref >0.102)
Beta-amyloid 40: 238.89 pg/mL
Beta-amyloid 42: 24.88 pg/mL
N -- NfL, Plasma: 6.28 pg/mL — AB (ref 0.00–6.04)
T -- p-tau181: 2.48 pg/mL — AB (ref 0.00–0.97)

## 2024-03-26 NOTE — Telephone Encounter (Signed)
 Patient read message via MyChart on 03/20/24.

## 2024-03-27 ENCOUNTER — Ambulatory Visit: Payer: Self-pay | Admitting: Neurology

## 2024-04-10 ENCOUNTER — Other Ambulatory Visit: Payer: Self-pay | Admitting: Neurology

## 2024-04-10 NOTE — Telephone Encounter (Signed)
 Last seen on 02/28/24 Follow up scheduled 08/05/24    [facility] is requesting a 90 day supply for Rx, I noticed you didn't send in any refills for Rx?

## 2024-04-21 DIAGNOSIS — R42 Dizziness and giddiness: Secondary | ICD-10-CM | POA: Diagnosis not present

## 2024-05-24 ENCOUNTER — Ambulatory Visit: Admitting: Cardiology

## 2024-05-30 ENCOUNTER — Ambulatory Visit: Admitting: Cardiology

## 2024-05-30 VITALS — BP 128/78 | HR 67 | Ht 68.0 in

## 2024-05-30 DIAGNOSIS — I10 Essential (primary) hypertension: Secondary | ICD-10-CM

## 2024-05-30 DIAGNOSIS — Z79899 Other long term (current) drug therapy: Secondary | ICD-10-CM

## 2024-05-30 DIAGNOSIS — Z952 Presence of prosthetic heart valve: Secondary | ICD-10-CM | POA: Diagnosis not present

## 2024-05-30 DIAGNOSIS — I2510 Atherosclerotic heart disease of native coronary artery without angina pectoris: Secondary | ICD-10-CM

## 2024-05-30 DIAGNOSIS — Z951 Presence of aortocoronary bypass graft: Secondary | ICD-10-CM

## 2024-05-30 DIAGNOSIS — R55 Syncope and collapse: Secondary | ICD-10-CM

## 2024-05-30 DIAGNOSIS — R42 Dizziness and giddiness: Secondary | ICD-10-CM | POA: Diagnosis not present

## 2024-05-30 DIAGNOSIS — E785 Hyperlipidemia, unspecified: Secondary | ICD-10-CM | POA: Diagnosis not present

## 2024-05-30 DIAGNOSIS — I493 Ventricular premature depolarization: Secondary | ICD-10-CM | POA: Diagnosis not present

## 2024-05-30 LAB — BASIC METABOLIC PANEL WITH GFR
BUN/Creatinine Ratio: 16 (ref 10–24)
BUN: 22 mg/dL (ref 8–27)
CO2: 23 mmol/L (ref 20–29)
Calcium: 9 mg/dL (ref 8.6–10.2)
Chloride: 104 mmol/L (ref 96–106)
Creatinine, Ser: 1.35 mg/dL — ABNORMAL HIGH (ref 0.76–1.27)
Glucose: 106 mg/dL — ABNORMAL HIGH (ref 70–99)
Potassium: 4.3 mmol/L (ref 3.5–5.2)
Sodium: 141 mmol/L (ref 134–144)
eGFR: 54 mL/min/{1.73_m2} — ABNORMAL LOW

## 2024-05-30 LAB — MAGNESIUM: Magnesium: 2.1 mg/dL (ref 1.6–2.3)

## 2024-05-30 MED ORDER — AMLODIPINE BESYLATE 5 MG PO TABS: 5.0000 mg | ORAL_TABLET | Freq: Every day | ORAL | 3 refills | Status: AC

## 2024-05-30 MED ORDER — ELIQUIS 5 MG PO TABS: 5.0000 mg | ORAL_TABLET | Freq: Two times a day (BID) | ORAL | 11 refills | Status: AC

## 2024-05-30 MED ORDER — REPATHA SURECLICK 140 MG/ML ~~LOC~~ SOAJ: 140.0000 mg | SUBCUTANEOUS | 3 refills | Status: AC

## 2024-05-30 NOTE — Patient Instructions (Signed)
 Medication Instructions:  NO CHANGES  Lab Work: BMET AND MAGNESIUM  TO BE DONE TODAY.  Testing/Procedures: NONE  Follow-Up: At [facility], you and your health needs are our priority.  As part of our continuing mission to provide you with exceptional heart care, our providers are all part of one team.  This team includes your primary Cardiologist (physician) and Advanced Practice Providers or APPs (Physician Assistants and Nurse Practitioners) who all work together to provide you with the care you need, when you need it.  Your next appointment:   6 MONTHS   Provider:   Lonni LITTIE Nanas, MD     Other Instructions:

## 2024-05-30 NOTE — Progress Notes (Signed)
 "  Cardiology Office Note:    Date:  05/30/2024   ID:  Edward [name], DOB December 10, 1945, MRN [id number]  PCP:  Kip Righter, MD  Cardiologist:  Lonni LITTIE Nanas, MD  Electrophysiologist:  OLE ONEIDA HOLTS, MD (Inactive)   Referring MD: Kip Righter, MD   Chief Complaint  Patient presents with   Follow-up    6 months.     History of Present Illness:    Edward [name] is a 79 y.o. male with a hx of CAD s/p CABG, aortic stenosis, hypertension, hyperlipidemia, myasthenia gravis, OSA, DVT who presents for follow-up.  He was referred by Dr. Kip for evaluation of dyspnea and chest pain, initially seen on 09/29/2020.  Echocardiogram 07/27/2017 showed LVEF 60 to 65%, grade 1 diastolic dysfunction, functionally bicuspid aortic valve with mild to moderate aortic stenosis and mild aortic regurgitation, mild MR, mild TR. Echocardiogram on 10/14/2020 showed normal biventricular function, mild LVH, mild to moderate aortic stenosis, mild aortic regurgitation, mild mitral regurgitation.  Lexiscan  Myoview  on 11/05/2020 showed fixed inferior defect likely representing artifact, no ischemia, EF 52%.  Calcium  score in 11/30/2020 was 674 (72nd percentile).  Continued to have chest pain and underwent cath on 02/11/2021 which showed severe multivessel CAD, moderate AS, normal filling pressures.  Underwent CABG x4 (LIMA-LAD, SVG-PDA, SVG-OM, SVG-D2 Y graft off OM), aortic valve replacement with 25 mm Inspiris Resilia on 03/03/2021.  Postop course was complicated by A. fib which was managed with amiodarone  and beta-blocker.  Also developed thrombocytopenia down to 62 but recovery without intervention.  Echocardiogram on 04/30/21 showed EF 50 to 55%, mildly reduced RV function, normal functioning bioprosthetic aortic valve.  Zio patch x3 days on 08/24/2021 showed rare PVCs (less than 1% of beats).  Echocardiogram 02/2024 showed EF 55 to 60%, normal RV function, normal functioning bioprosthetic aortic valve.  Zio patch x 14 days  02/2024 showed 34 episodes of SVT with longest lasting 15 seconds, 1 episode of NSVT lasting 7 beats, occasional PVCs (1.5%)  Since last clinic visit, he reports he is doing okay.  Does report having some lightheadedness, especially with standing.  Denies any syncope.  Reports some abdominal discomfort but no chest pain.  Denies any dyspnea.  He has not been exercising.   Wt Readings from Last 3 Encounters:  02/28/24 187 lb (84.8 kg)  02/15/24 180 lb (81.6 kg)  11/21/23 184 lb 12.8 oz (83.8 kg)      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 by echo 07/2017   Chronic kidney disease    kidney stones   Colon polyps    Coronary artery disease 2022   Diplopia 10/15/2015   Diverticulosis    DVT (deep venous thrombosis) (HCC)    2018, 2019   Erectile dysfunction    GERD (gastroesophageal reflux disease)    with Barrett's esophagus   Gout    Hearing loss    hearing aides   Heart murmur    pt says he has had it since he was a child, no issues ever mentioned   HTN (hypertension)    Hypercholesteremia    Migraine headache    Myasthenia gravis (HCC)    Obesity    OSA (obstructive sleep apnea)    AHI 43/hr now on CPAP at 10cm H2O   Peripheral neuropathy 01/14/2019   feet   Right foot drop 12/03/2018  Past Surgical History:  Procedure Laterality Date   AORTIC VALVE REPLACEMENT N/A 03/03/2021   Procedure: AORTIC VALVE REPLACEMENT (AVR) WITH BIOPROSTHETIC VALVE, Inpiris Resila Aortic Valve 25mm;  Surgeon: Shyrl Linnie KIDD, MD;  Location: [facility] OR;  Service: Open Heart Surgery;  Laterality: N/A;   CORONARY ARTERY BYPASS GRAFT N/A 03/03/2021   Procedure: CORONARY ARTERY BYPASS GRAFTING (CABG) x four on pump, using left internal mammary artery, left and right endoscopic greater saphenous veins conduits;  Surgeon: Shyrl Linnie KIDD, MD;  Location: [facility] OR;  Service: Open  Heart Surgery;  Laterality: N/A;   ENDOVEIN HARVEST OF GREATER SAPHENOUS VEIN Bilateral 03/03/2021   Procedure: ENDOVEIN HARVEST OF GREATER SAPHENOUS VEIN;  Surgeon: Shyrl Linnie KIDD, MD;  Location: [facility] OR;  Service: Open Heart Surgery;  Laterality: Bilateral;   laser assisted uvuloplasty     MEDIASTINAL EXPLORATION N/A 03/03/2021   Procedure: MEDIASTINAL EXPLORATION;  Surgeon: Shyrl Linnie KIDD, MD;  Location: [facility] OR;  Service: Open Heart Surgery;  Laterality: N/A;   RIGHT/LEFT HEART CATH AND CORONARY ANGIOGRAPHY N/A 02/11/2021   Procedure: RIGHT/LEFT HEART CATH AND CORONARY ANGIOGRAPHY;  Surgeon: Jordan, Peter M, MD;  Location: [facility] INVASIVE CV LAB;  Service: Cardiovascular;  Laterality: N/A;   TEE WITHOUT CARDIOVERSION N/A 03/03/2021   Procedure: TRANSESOPHAGEAL ECHOCARDIOGRAM (TEE);  Surgeon: Shyrl Linnie KIDD, MD;  Location: [facility] OR;  Service: Open Heart Surgery;  Laterality: N/A;   TRIGGER FINGER RELEASE Left 02/15/2024   Procedure: RELEASE, A1 PULLEY, FOR  LEFT TRIGGER AND LONG FINGERS;  Surgeon: Murrell Drivers, MD;  Location: [facility] OR;  Service: Orthopedics;  Laterality: Left;  LEFT INDEX FINGER AND LEFT LONG FINGER TRIGGER RELEASES   trigger finger surgery  01/2024   left hand    Current Medications: Current Meds  Medication Sig   allopurinol (ZYLOPRIM) 300 MG tablet Take 300 mg by mouth in the morning.   Biotin 1000 MCG tablet Take 1,000 mcg by mouth in the morning.   cyanocobalamin  (VITAMIN B12) 1000 MCG tablet Take 1,000 mcg by mouth daily.   dicyclomine (BENTYL) 20 MG tablet Take 20 mg by mouth 4 (four) times daily.   fish oil-omega-3 fatty acids 1000 MG capsule Take 1 g by mouth in the morning and at bedtime.   memantine  (NAMENDA ) 5 MG tablet Take 5 mg by mouth 2 (two) times daily.   NON FORMULARY Pt uses a cpap nightly   pantoprazole  (PROTONIX ) 40 MG tablet Take 40 mg by mouth daily.   Polyethyl Glycol-Propyl Glycol (LUBRICANT EYE DROPS) 0.4-0.3 % SOLN Place 1-2 drops into  both eyes 3 (three) times daily as needed (tired/dry/irritated eyes.).   pyridostigmine  (MESTINON ) 60 MG tablet TAKE 1 TABLET BY MOUTH 3 TIMES DAILY. (Patient taking differently: Take by mouth. 1 tablet in the morning, 1/2 tablet in the afternoon, and 1/2 tablet at night)   [DISCONTINUED] amLODipine  (NORVASC ) 5 MG tablet Take 1 tablet (5 mg total) by mouth daily.   [DISCONTINUED] ELIQUIS  5 MG TABS tablet Take 5 mg by mouth 2 (two) times daily.   [DISCONTINUED] Evolocumab  (REPATHA  SURECLICK) 140 MG/ML SOAJ INJECT 140 MG INTO THE SKIN EVERY 14 (FOURTEEN) DAYS.     Allergies:   Patient has no known allergies.   Social History   Socioeconomic History   Marital status: Married    Spouse name: Rock   Number of children: 2   Years of education: College   Highest education level: Not on file  Occupational History   Occupation: Retired  Tobacco Use  Smoking status: Never   Smokeless tobacco: Never  Vaping Use   Vaping status: Never Used  Substance and Sexual Activity   Alcohol use: Not Currently   Drug use: No   Sexual activity: Not on file  Other Topics Concern   Not on file  Social History Narrative   Lives home w/ his wife   Right-handed    Drinks about 1 cup of caffeine per day   Social Drivers of Health   Tobacco Use: Low Risk (02/28/2024)   Received from [facility]   Patient History    Smoking Tobacco Use: Never    Smokeless Tobacco Use: Never    Passive Exposure: Not on file  Financial Resource Strain: Not on file  Food Insecurity: Low Risk (08/15/2023)   Received from [facility]   Epic    Within the past 12 months, you worried that your food would run out before you got money to buy more: Never true    Within the past 12 months, the food you bought just didn't last and you didn't have money to get more. : Never true  Transportation Needs: No Transportation Needs (08/15/2023)   Received from [facility]    In the past 12 months, has lack of  reliable transportation kept you from medical appointments, meetings, work or from getting things needed for daily living? : No  Physical Activity: Not on file  Stress: Not on file  Social Connections: Not on file  Depression (PHQ2-9): Low Risk (08/06/2021)   Depression (PHQ2-9)    PHQ-2 Score: 0  Alcohol Screen: Not on file  Housing: Low Risk (08/15/2023)   Received from [facility]   Epic    What is your living situation today?: I have a steady place to live    Think about the place you live. Do you have problems with any of the following? Choose all that apply:: None/None on this list  Utilities: Low Risk (08/15/2023)   Received from [facility]   Utilities    In the past 12 months has the electric, gas, oil, or water company threatened to shut off services in your home? : No  Health Literacy: Not on file     Family History: The patient's family history includes Anemia in his brother; Cancer in his brother; Heart attack in his father; Heart disease in his brother and father; Hypertension in his brother, brother, mother, and sister.  ROS:   Please see the history of present illness.    All other systems reviewed and are negative.  EKGs/Labs/Other Studies Reviewed:      US  LE Venous 08/01/2017: Final Interpretation:  Right: There is evidence of acute DVT in the Posterior Tibial veins.  Ultrasound is unable to distinguish whether obstruction in the Femoral  vein, and Popliteal vein is acute or chronic.   TTE 07/27/2017: - Left ventricle: The cavity size was normal. There was mild    concentric hypertrophy. Systolic function was normal. The    estimated ejection fraction was in the range of 60% to 65%. Wall    motion was normal; there were no regional wall motion    abnormalities. Doppler parameters are consistent with abnormal    left ventricular relaxation (grade 1 diastolic dysfunction).  - Aortic valve: Functionally bicuspid; moderately thickened,    moderately calcified  leaflets. There was mild to moderate    stenosis. There was mild regurgitation. Peak gradient (S): 25 mm    Hg. Valve area (VTI):  1.24 cm^2. Valve area (Vmax): 1.42 cm^2.    Valve area (Vmean): 1.37 cm^2.  - Mitral valve: There was mild regurgitation.  - Left atrium: The atrium was at the upper limits of normal in    size.  - Right ventricle: The cavity size was normal. Wall thickness was    normal. Systolic function was normal.  - Tricuspid valve: There was mild regurgitation.  - Pulmonary arteries: Systolic pressure was within the normal    range.   US  Venous LE Doppler 06/27/2016: 1. Positive for acute to subacute deep venous thrombosis extending from the popliteal vein into the common femoral vein. The common femoral and femoral venous thrombus is nonocclusive while the popliteal venous thrombus is occlusive.  Echo 08/31/2015: - Left ventricle: The cavity size was normal. Wall thickness was    normal. Systolic function was vigorous. The estimated ejection    fraction was in the range of 65% to 70%. Wall motion was normal;    there were no regional wall motion abnormalities. Doppler    parameters are consistent with abnormal left ventricular    relaxation (grade 1 diastolic dysfunction).  - Aortic valve: There was mild regurgitation. Mean gradient (S): 9    mm Hg. Peak gradient (S): 15 mm Hg. Valve area (VTI): 1.98 cm^2.    Valve area (Vmax): 2.07 cm^2. Valve area (Vmean): 1.87 cm^2.  - Pulmonary arteries: Systolic pressure was mildly increased. PA    peak pressure: 32 mm Hg (S).   EKG:  05/30/2024: Normal sinus rhythm, rate 67, nonspecific T wave flattening 05/24/2023: Normal sinus rhythm, rate 72, nonspecific T wave flattening 11/22/2022: Sinus bradycardia, rate 51, nonspecific T wave flattening 03/30/2022: Normal sinus rhythm, rate 60, nonspecific T wave flattening, no PVCs 03/17/21: Sinus bradycardia, rate 52, less than 1 mm ST depressions in leads V3-6, nonspecific T wave  flattening 12/08/20: Sinus rhythm, PVCs, rate 71, Q waves in lead III 11/09/2020: no ekg ordered today.  09/29/2020: NSR, PACs, PVCs, nonspecific T wave flattening, rate 84 bpm   Recent Labs: 11/13/2023: ALT 19; Hemoglobin 13.2; Platelets 212 02/28/2024: BUN 26; Creatinine, Ser 1.45; Magnesium  2.3; Potassium 4.5; Sodium 142; TSH 2.050  Recent Lipid Panel    Component Value Date/Time   CHOL 129 03/30/2023 1659   TRIG 123 03/30/2023 1659   HDL 42 03/30/2023 1659   CHOLHDL 3.1 03/30/2023 1659   LDLCALC 65 03/30/2023 1659    Physical Exam:    VS:  BP 128/78 (BP Location: Left Arm, Patient Position: Sitting, Cuff Size: Normal)   Pulse 67   Ht 5' 8 (1.727 m)   BMI 28.43 kg/m     Wt Readings from Last 3 Encounters:  02/28/24 187 lb (84.8 kg)  02/15/24 180 lb (81.6 kg)  11/21/23 184 lb 12.8 oz (83.8 kg)     GEN:  in no acute distress HEENT: Normal NECK: No JVD; No carotid bruits CARDIAC: RRR, no murmur RESPIRATORY:  Clear to auscultation without rales, wheezing or rhonchi  ABDOMEN: Soft, non-tender, non-distended MUSCULOSKELETAL:  Trace LE edema.   SKIN: Warm and dry NEUROLOGIC:  Alert and oriented x 3 PSYCHIATRIC:  Normal affect   ASSESSMENT:    1. Coronary artery disease involving native coronary artery of native heart without angina pectoris   2. PVC's (premature ventricular contractions)   3. Medication management   4. Postural dizziness with presyncope   5. S/P AVR   6. S/P CABG x 4   7. Hyperlipidemia LDL goal <70   8.  Essential hypertension       PLAN:    CAD: underwent cath on 02/11/2021 which showed severe multivessel CAD, moderate AS, normal filling pressures.  Underwent CABG x4 (LIMA-LAD, SVG-PDA, SVG-OM, SVG-D2 Y graft off OM) on 03/03/21 -Continue Eliquis  -Continue Repatha  -Stopped metoprolol  due to his myasthenia  Aortic stenosis s/p AVR: Mild to moderate on echo 07/2017.  Echocardiogram on 10/14/2020 showed normal biventricular function, mild LVH, mild  to moderate aortic stenosis, mild aortic regurgitation, mild mitral regurgitation.  Cath 02/11/2021 suggested moderate aortic stenosis.  Underwent aortic valve replacement with 25 mm Inspiris Resilia on 03/03/2021 at time of CABG.  Echocardiogram on 04/30/21 showed EF 50 to 55%, mildly reduced RV function, normal functioning bioprosthetic aortic valve.  Echocardiogram 02/2024 showed EF 55 to 60%, normal RV function, normal functioning bioprosthetic aortic valve.    Lightheadedness: Echocardiogram 02/2024 showed EF 55 to 60%, normal RV function, normal functioning bioprosthetic aortic valve.  Zio patch x 14 days 02/2024 showed 34 episodes of SVT with longest lasting 15 seconds, 1 episode of NSVT lasting 7 beats, occasional PVCs (1.5%)  Hyperlipidemia: On rosuvastatin  10 mg daily, LDL 68 on 09/29/22.  Did not tolerate atorvastatin  or rosuvastatin  due to myalgias.  LDL 175 on 01/02/2023.  Started Repatha , LDL 58 on 11/2023  Hypertension: On amlodipine  5 mg daily and losartan  50 mg daily prior to CABG. Switched to metoprolol  25 mg twice daily following CABG but was discontinued due to worsening myasthenia symptoms.  Restarted back on losartan  50 mg daily and amlodipine  5 mg daily, but developed orthostasis and losartan  discontinued 10/2023.  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  Zio patch x3 days 12/16/2020 showed 95 episodes of NSVT with longest lasting 12 beats and frequent PVCs (14% of beats).  Suspect related to ischemia in setting of severe multivessel disease.  Zio patch x3 days on 08/24/2021 showed rare PVCs (less than 1% of beats).  Zio patch 02/2024 showed 1.5% PVC burden  Paroxysmal atrial fibrillation: Occurred in postoperative setting after CABG.  Loaded with amiodarone , has been discontinued.  Continued on Eliquis .  Currently in sinus rhythm.  OSA: on CPAP, reports compliance  RTC in 6  months   Medication Adjustments/Labs and Tests Ordered: Current medicines are reviewed at length with the patient today.  Concerns regarding medicines are outlined above.  Orders Placed This Encounter  Procedures   Basic metabolic panel with GFR   Magnesium    EKG 12-Lead     Meds ordered this encounter  Medications   amLODipine  (NORVASC ) 5 MG tablet    Sig: Take 1 tablet (5 mg total) by mouth daily.    Dispense:  90 tablet    Refill:  3    Restart   ELIQUIS  5 MG TABS tablet    Sig: Take 1 tablet (5 mg total) by mouth 2 (two) times daily.    Dispense:  60 tablet    Refill:  11   Evolocumab  (REPATHA  SURECLICK) 140 MG/ML SOAJ    Sig: Inject 140 mg into the skin every 14 (fourteen) days.    Dispense:  6 mL    Refill:  3      Patient Instructions  Medication Instructions:  NO CHANGES  Lab Work: BMET AND MAGNESIUM  TO BE DONE TODAY.  Testing/Procedures: NONE  Follow-Up: At [facility], you and your health needs are our priority.  As part of our continuing  mission to provide you with exceptional heart care, our providers are all part of one team.  This team includes your primary Cardiologist (physician) and Advanced Practice Providers or APPs (Physician Assistants and Nurse Practitioners) who all work together to provide you with the care you need, when you need it.  Your next appointment:   6 MONTHS   Provider:   Lonni LITTIE Nanas, MD     Other Instructions:                Signed, Lonni LITTIE Nanas, MD  05/30/2024 9:29 AM    [facility] "

## 2024-05-31 ENCOUNTER — Ambulatory Visit: Payer: Self-pay | Admitting: Cardiology

## 2024-08-05 ENCOUNTER — Ambulatory Visit: Admitting: Diagnostic Neuroimaging
